# Patient Record
Sex: Female | Born: 1975 | Race: Black or African American | Hispanic: No | Marital: Single | Smoking: Never smoker
Health system: Southern US, Community
[De-identification: ages and names within clinical notes are randomized; demographics above are authoritative.]

## PROBLEM LIST (undated history)

## (undated) ENCOUNTER — Inpatient Hospital Stay (HOSPITAL_COMMUNITY): Payer: Self-pay

## (undated) DIAGNOSIS — F329 Major depressive disorder, single episode, unspecified: Secondary | ICD-10-CM

## (undated) DIAGNOSIS — B009 Herpesviral infection, unspecified: Secondary | ICD-10-CM

## (undated) DIAGNOSIS — F32A Depression, unspecified: Secondary | ICD-10-CM

## (undated) HISTORY — PX: NO PAST SURGERIES: SHX2092

## (undated) HISTORY — PX: WISDOM TOOTH EXTRACTION: SHX21

---

## 1998-03-24 ENCOUNTER — Emergency Department (HOSPITAL_COMMUNITY): Admission: EM | Admit: 1998-03-24 | Discharge: 1998-03-24 | Payer: Self-pay

## 1998-04-24 ENCOUNTER — Inpatient Hospital Stay (HOSPITAL_COMMUNITY): Admission: AD | Admit: 1998-04-24 | Discharge: 1998-04-24 | Payer: Self-pay | Admitting: *Deleted

## 1998-04-27 ENCOUNTER — Emergency Department (HOSPITAL_COMMUNITY): Admission: EM | Admit: 1998-04-27 | Discharge: 1998-04-27 | Payer: Self-pay | Admitting: Emergency Medicine

## 1998-05-15 ENCOUNTER — Inpatient Hospital Stay (HOSPITAL_COMMUNITY): Admission: AD | Admit: 1998-05-15 | Discharge: 1998-05-15 | Payer: Self-pay | Admitting: Obstetrics

## 1998-06-10 ENCOUNTER — Emergency Department (HOSPITAL_COMMUNITY): Admission: EM | Admit: 1998-06-10 | Discharge: 1998-06-10 | Payer: Self-pay | Admitting: Emergency Medicine

## 1998-06-15 ENCOUNTER — Encounter: Payer: Self-pay | Admitting: Emergency Medicine

## 1998-06-15 ENCOUNTER — Inpatient Hospital Stay (HOSPITAL_COMMUNITY): Admission: EM | Admit: 1998-06-15 | Discharge: 1998-06-17 | Payer: Self-pay | Admitting: Emergency Medicine

## 1998-06-16 ENCOUNTER — Encounter: Payer: Self-pay | Admitting: Internal Medicine

## 1998-07-22 ENCOUNTER — Inpatient Hospital Stay (HOSPITAL_COMMUNITY): Admission: AD | Admit: 1998-07-22 | Discharge: 1998-07-22 | Payer: Self-pay | Admitting: Obstetrics & Gynecology

## 1998-08-02 ENCOUNTER — Emergency Department (HOSPITAL_COMMUNITY): Admission: EM | Admit: 1998-08-02 | Discharge: 1998-08-02 | Payer: Self-pay | Admitting: Emergency Medicine

## 1998-08-09 ENCOUNTER — Inpatient Hospital Stay (HOSPITAL_COMMUNITY): Admission: AD | Admit: 1998-08-09 | Discharge: 1998-08-09 | Payer: Self-pay | Admitting: *Deleted

## 1998-08-15 ENCOUNTER — Encounter: Admission: RE | Admit: 1998-08-15 | Discharge: 1998-08-15 | Payer: Self-pay | Admitting: Internal Medicine

## 1998-08-21 ENCOUNTER — Emergency Department (HOSPITAL_COMMUNITY): Admission: EM | Admit: 1998-08-21 | Discharge: 1998-08-21 | Payer: Self-pay | Admitting: Emergency Medicine

## 1998-10-05 ENCOUNTER — Emergency Department (HOSPITAL_COMMUNITY): Admission: EM | Admit: 1998-10-05 | Discharge: 1998-10-05 | Payer: Self-pay | Admitting: Emergency Medicine

## 1998-10-24 ENCOUNTER — Inpatient Hospital Stay (HOSPITAL_COMMUNITY): Admission: AD | Admit: 1998-10-24 | Discharge: 1998-10-24 | Payer: Self-pay | Admitting: Obstetrics

## 1998-11-28 ENCOUNTER — Emergency Department (HOSPITAL_COMMUNITY): Admission: EM | Admit: 1998-11-28 | Discharge: 1998-11-28 | Payer: Self-pay | Admitting: Emergency Medicine

## 1998-12-18 ENCOUNTER — Emergency Department (HOSPITAL_COMMUNITY): Admission: EM | Admit: 1998-12-18 | Discharge: 1998-12-18 | Payer: Self-pay | Admitting: Emergency Medicine

## 1998-12-28 ENCOUNTER — Emergency Department (HOSPITAL_COMMUNITY): Admission: EM | Admit: 1998-12-28 | Discharge: 1998-12-28 | Payer: Self-pay | Admitting: *Deleted

## 1999-01-17 ENCOUNTER — Emergency Department (HOSPITAL_COMMUNITY): Admission: EM | Admit: 1999-01-17 | Discharge: 1999-01-17 | Payer: Self-pay | Admitting: Emergency Medicine

## 1999-01-17 ENCOUNTER — Encounter: Payer: Self-pay | Admitting: Emergency Medicine

## 1999-01-25 ENCOUNTER — Inpatient Hospital Stay (HOSPITAL_COMMUNITY): Admission: AD | Admit: 1999-01-25 | Discharge: 1999-01-25 | Payer: Self-pay | Admitting: Obstetrics

## 1999-02-05 ENCOUNTER — Emergency Department (HOSPITAL_COMMUNITY): Admission: EM | Admit: 1999-02-05 | Discharge: 1999-02-05 | Payer: Self-pay | Admitting: Emergency Medicine

## 1999-03-20 ENCOUNTER — Emergency Department (HOSPITAL_COMMUNITY): Admission: EM | Admit: 1999-03-20 | Discharge: 1999-03-20 | Payer: Self-pay | Admitting: Emergency Medicine

## 1999-03-23 ENCOUNTER — Emergency Department (HOSPITAL_COMMUNITY): Admission: EM | Admit: 1999-03-23 | Discharge: 1999-03-23 | Payer: Self-pay | Admitting: Emergency Medicine

## 1999-04-03 ENCOUNTER — Emergency Department (HOSPITAL_COMMUNITY): Admission: EM | Admit: 1999-04-03 | Discharge: 1999-04-03 | Payer: Self-pay | Admitting: Emergency Medicine

## 1999-04-18 ENCOUNTER — Emergency Department (HOSPITAL_COMMUNITY): Admission: EM | Admit: 1999-04-18 | Discharge: 1999-04-18 | Payer: Self-pay | Admitting: Emergency Medicine

## 1999-05-09 ENCOUNTER — Inpatient Hospital Stay (HOSPITAL_COMMUNITY): Admission: AD | Admit: 1999-05-09 | Discharge: 1999-05-09 | Payer: Self-pay | Admitting: Obstetrics & Gynecology

## 1999-05-17 ENCOUNTER — Encounter: Payer: Self-pay | Admitting: *Deleted

## 1999-05-17 ENCOUNTER — Inpatient Hospital Stay (HOSPITAL_COMMUNITY): Admission: RE | Admit: 1999-05-17 | Discharge: 1999-05-17 | Payer: Self-pay | Admitting: *Deleted

## 1999-05-17 ENCOUNTER — Emergency Department (HOSPITAL_COMMUNITY): Admission: EM | Admit: 1999-05-17 | Discharge: 1999-05-17 | Payer: Self-pay | Admitting: Emergency Medicine

## 1999-06-27 ENCOUNTER — Inpatient Hospital Stay (HOSPITAL_COMMUNITY): Admission: AD | Admit: 1999-06-27 | Discharge: 1999-06-27 | Payer: Self-pay | Admitting: *Deleted

## 1999-07-03 ENCOUNTER — Inpatient Hospital Stay (HOSPITAL_COMMUNITY): Admission: AD | Admit: 1999-07-03 | Discharge: 1999-07-03 | Payer: Self-pay | Admitting: Obstetrics

## 1999-07-04 ENCOUNTER — Emergency Department (HOSPITAL_COMMUNITY): Admission: EM | Admit: 1999-07-04 | Discharge: 1999-07-04 | Payer: Self-pay | Admitting: *Deleted

## 1999-07-20 ENCOUNTER — Encounter: Payer: Self-pay | Admitting: Obstetrics

## 1999-07-20 ENCOUNTER — Observation Stay (HOSPITAL_COMMUNITY): Admission: AD | Admit: 1999-07-20 | Discharge: 1999-07-20 | Payer: Self-pay | Admitting: Obstetrics

## 1999-08-06 ENCOUNTER — Observation Stay (HOSPITAL_COMMUNITY): Admission: AD | Admit: 1999-08-06 | Discharge: 1999-08-06 | Payer: Self-pay | Admitting: Obstetrics & Gynecology

## 1999-08-06 ENCOUNTER — Encounter: Payer: Self-pay | Admitting: Obstetrics

## 1999-08-10 ENCOUNTER — Encounter: Admission: RE | Admit: 1999-08-10 | Discharge: 1999-08-10 | Payer: Self-pay | Admitting: Obstetrics

## 1999-08-23 ENCOUNTER — Encounter: Admission: RE | Admit: 1999-08-23 | Discharge: 1999-08-23 | Payer: Self-pay | Admitting: Obstetrics & Gynecology

## 1999-08-24 ENCOUNTER — Inpatient Hospital Stay (HOSPITAL_COMMUNITY): Admission: AD | Admit: 1999-08-24 | Discharge: 1999-08-24 | Payer: Self-pay | Admitting: Obstetrics & Gynecology

## 1999-09-06 ENCOUNTER — Encounter: Admission: RE | Admit: 1999-09-06 | Discharge: 1999-09-06 | Payer: Self-pay | Admitting: Obstetrics & Gynecology

## 1999-09-08 ENCOUNTER — Observation Stay (HOSPITAL_COMMUNITY): Admission: AD | Admit: 1999-09-08 | Discharge: 1999-09-09 | Payer: Self-pay | Admitting: Obstetrics & Gynecology

## 1999-09-12 ENCOUNTER — Ambulatory Visit (HOSPITAL_COMMUNITY): Admission: RE | Admit: 1999-09-12 | Discharge: 1999-09-12 | Payer: Self-pay | Admitting: *Deleted

## 1999-09-20 ENCOUNTER — Encounter: Admission: RE | Admit: 1999-09-20 | Discharge: 1999-09-20 | Payer: Self-pay | Admitting: Obstetrics & Gynecology

## 1999-10-04 ENCOUNTER — Encounter: Admission: RE | Admit: 1999-10-04 | Discharge: 1999-10-04 | Payer: Self-pay | Admitting: Obstetrics & Gynecology

## 1999-10-16 ENCOUNTER — Inpatient Hospital Stay (HOSPITAL_COMMUNITY): Admission: AD | Admit: 1999-10-16 | Discharge: 1999-10-16 | Payer: Self-pay | Admitting: Obstetrics

## 1999-10-17 ENCOUNTER — Inpatient Hospital Stay (HOSPITAL_COMMUNITY): Admission: AD | Admit: 1999-10-17 | Discharge: 1999-10-17 | Payer: Self-pay | Admitting: *Deleted

## 1999-10-27 ENCOUNTER — Inpatient Hospital Stay (HOSPITAL_COMMUNITY): Admission: AD | Admit: 1999-10-27 | Discharge: 1999-10-27 | Payer: Self-pay | Admitting: Obstetrics & Gynecology

## 1999-11-01 ENCOUNTER — Encounter: Admission: RE | Admit: 1999-11-01 | Discharge: 1999-11-01 | Payer: Self-pay | Admitting: Obstetrics & Gynecology

## 1999-11-13 ENCOUNTER — Ambulatory Visit (HOSPITAL_COMMUNITY): Admission: RE | Admit: 1999-11-13 | Discharge: 1999-11-13 | Payer: Self-pay | Admitting: *Deleted

## 1999-11-15 ENCOUNTER — Encounter (HOSPITAL_COMMUNITY): Admission: RE | Admit: 1999-11-15 | Discharge: 1999-12-21 | Payer: Self-pay | Admitting: Obstetrics & Gynecology

## 1999-11-15 ENCOUNTER — Encounter: Admission: RE | Admit: 1999-11-15 | Discharge: 1999-11-15 | Payer: Self-pay | Admitting: Obstetrics & Gynecology

## 1999-11-15 ENCOUNTER — Inpatient Hospital Stay (HOSPITAL_COMMUNITY): Admission: AD | Admit: 1999-11-15 | Discharge: 1999-11-15 | Payer: Self-pay | Admitting: Obstetrics

## 1999-11-15 ENCOUNTER — Encounter: Payer: Self-pay | Admitting: Obstetrics

## 1999-11-16 ENCOUNTER — Inpatient Hospital Stay (HOSPITAL_COMMUNITY): Admission: AD | Admit: 1999-11-16 | Discharge: 1999-11-16 | Payer: Self-pay | Admitting: Obstetrics & Gynecology

## 1999-11-17 ENCOUNTER — Inpatient Hospital Stay (HOSPITAL_COMMUNITY): Admission: AD | Admit: 1999-11-17 | Discharge: 1999-11-17 | Payer: Self-pay | Admitting: *Deleted

## 1999-11-22 ENCOUNTER — Encounter: Admission: RE | Admit: 1999-11-22 | Discharge: 1999-11-22 | Payer: Self-pay | Admitting: Obstetrics & Gynecology

## 1999-11-23 ENCOUNTER — Inpatient Hospital Stay (HOSPITAL_COMMUNITY): Admission: AD | Admit: 1999-11-23 | Discharge: 1999-11-23 | Payer: Self-pay | Admitting: *Deleted

## 1999-12-06 ENCOUNTER — Encounter: Admission: RE | Admit: 1999-12-06 | Discharge: 1999-12-06 | Payer: Self-pay | Admitting: Obstetrics

## 1999-12-11 ENCOUNTER — Inpatient Hospital Stay (HOSPITAL_COMMUNITY): Admission: AD | Admit: 1999-12-11 | Discharge: 1999-12-11 | Payer: Self-pay | Admitting: Obstetrics

## 1999-12-13 ENCOUNTER — Encounter: Admission: RE | Admit: 1999-12-13 | Discharge: 1999-12-13 | Payer: Self-pay | Admitting: Obstetrics

## 1999-12-18 ENCOUNTER — Encounter: Payer: Self-pay | Admitting: Obstetrics

## 1999-12-18 ENCOUNTER — Inpatient Hospital Stay (HOSPITAL_COMMUNITY): Admission: AD | Admit: 1999-12-18 | Discharge: 1999-12-22 | Payer: Self-pay | Admitting: Obstetrics

## 1999-12-23 ENCOUNTER — Encounter: Admission: RE | Admit: 1999-12-23 | Discharge: 2000-03-22 | Payer: Self-pay | Admitting: Obstetrics

## 2000-01-03 ENCOUNTER — Emergency Department (HOSPITAL_COMMUNITY): Admission: EM | Admit: 2000-01-03 | Discharge: 2000-01-03 | Payer: Self-pay | Admitting: Emergency Medicine

## 2000-01-05 ENCOUNTER — Inpatient Hospital Stay (HOSPITAL_COMMUNITY): Admission: AD | Admit: 2000-01-05 | Discharge: 2000-01-05 | Payer: Self-pay | Admitting: *Deleted

## 2000-03-05 ENCOUNTER — Encounter: Payer: Self-pay | Admitting: Emergency Medicine

## 2000-03-05 ENCOUNTER — Emergency Department (HOSPITAL_COMMUNITY): Admission: EM | Admit: 2000-03-05 | Discharge: 2000-03-05 | Payer: Self-pay | Admitting: Emergency Medicine

## 2000-03-10 ENCOUNTER — Emergency Department (HOSPITAL_COMMUNITY): Admission: EM | Admit: 2000-03-10 | Discharge: 2000-03-10 | Payer: Self-pay | Admitting: Internal Medicine

## 2000-03-15 ENCOUNTER — Emergency Department (HOSPITAL_COMMUNITY): Admission: EM | Admit: 2000-03-15 | Discharge: 2000-03-15 | Payer: Self-pay | Admitting: Emergency Medicine

## 2000-04-06 ENCOUNTER — Inpatient Hospital Stay (HOSPITAL_COMMUNITY): Admission: EM | Admit: 2000-04-06 | Discharge: 2000-04-09 | Payer: Self-pay | Admitting: Pulmonary Disease

## 2000-04-06 ENCOUNTER — Encounter: Payer: Self-pay | Admitting: Emergency Medicine

## 2000-05-06 LAB — PULMONARY FUNCTION TEST

## 2000-05-11 ENCOUNTER — Emergency Department (HOSPITAL_COMMUNITY): Admission: EM | Admit: 2000-05-11 | Discharge: 2000-05-11 | Payer: Self-pay | Admitting: Emergency Medicine

## 2000-05-11 ENCOUNTER — Encounter: Payer: Self-pay | Admitting: Emergency Medicine

## 2000-11-03 ENCOUNTER — Inpatient Hospital Stay (HOSPITAL_COMMUNITY): Admission: AD | Admit: 2000-11-03 | Discharge: 2000-11-03 | Payer: Self-pay | Admitting: Obstetrics

## 2000-11-03 ENCOUNTER — Encounter: Payer: Self-pay | Admitting: *Deleted

## 2000-11-06 ENCOUNTER — Inpatient Hospital Stay (HOSPITAL_COMMUNITY): Admission: AD | Admit: 2000-11-06 | Discharge: 2000-11-06 | Payer: Self-pay | Admitting: *Deleted

## 2000-11-26 ENCOUNTER — Inpatient Hospital Stay (HOSPITAL_COMMUNITY): Admission: AD | Admit: 2000-11-26 | Discharge: 2000-11-26 | Payer: Self-pay | Admitting: *Deleted

## 2000-11-29 ENCOUNTER — Inpatient Hospital Stay (HOSPITAL_COMMUNITY): Admission: AD | Admit: 2000-11-29 | Discharge: 2000-11-29 | Payer: Self-pay | Admitting: *Deleted

## 2000-12-25 ENCOUNTER — Inpatient Hospital Stay (HOSPITAL_COMMUNITY): Admission: AD | Admit: 2000-12-25 | Discharge: 2000-12-25 | Payer: Self-pay | Admitting: *Deleted

## 2001-01-27 ENCOUNTER — Ambulatory Visit (HOSPITAL_COMMUNITY): Admission: RE | Admit: 2001-01-27 | Discharge: 2001-01-27 | Payer: Self-pay | Admitting: Obstetrics

## 2001-02-05 ENCOUNTER — Inpatient Hospital Stay (HOSPITAL_COMMUNITY): Admission: AD | Admit: 2001-02-05 | Discharge: 2001-02-05 | Payer: Self-pay | Admitting: Obstetrics

## 2001-02-06 ENCOUNTER — Encounter: Admission: RE | Admit: 2001-02-06 | Discharge: 2001-02-06 | Payer: Self-pay | Admitting: Obstetrics

## 2001-02-20 ENCOUNTER — Encounter: Admission: RE | Admit: 2001-02-20 | Discharge: 2001-02-20 | Payer: Self-pay | Admitting: Obstetrics

## 2001-03-01 ENCOUNTER — Inpatient Hospital Stay (HOSPITAL_COMMUNITY): Admission: AD | Admit: 2001-03-01 | Discharge: 2001-03-01 | Payer: Self-pay | Admitting: Obstetrics

## 2001-03-06 ENCOUNTER — Encounter: Admission: RE | Admit: 2001-03-06 | Discharge: 2001-03-06 | Payer: Self-pay | Admitting: Obstetrics

## 2001-03-15 ENCOUNTER — Inpatient Hospital Stay (HOSPITAL_COMMUNITY): Admission: AD | Admit: 2001-03-15 | Discharge: 2001-03-15 | Payer: Self-pay | Admitting: *Deleted

## 2001-03-19 ENCOUNTER — Inpatient Hospital Stay (HOSPITAL_COMMUNITY): Admission: AD | Admit: 2001-03-19 | Discharge: 2001-03-19 | Payer: Self-pay | Admitting: Obstetrics

## 2001-04-02 ENCOUNTER — Encounter: Admission: RE | Admit: 2001-04-02 | Discharge: 2001-04-02 | Payer: Self-pay | Admitting: Obstetrics & Gynecology

## 2001-04-17 ENCOUNTER — Encounter: Admission: RE | Admit: 2001-04-17 | Discharge: 2001-04-17 | Payer: Self-pay | Admitting: Obstetrics

## 2001-04-28 ENCOUNTER — Ambulatory Visit (HOSPITAL_COMMUNITY): Admission: RE | Admit: 2001-04-28 | Discharge: 2001-04-28 | Payer: Self-pay | Admitting: Obstetrics & Gynecology

## 2001-05-08 ENCOUNTER — Encounter: Admission: RE | Admit: 2001-05-08 | Discharge: 2001-05-08 | Payer: Self-pay | Admitting: Obstetrics

## 2001-06-05 ENCOUNTER — Inpatient Hospital Stay (HOSPITAL_COMMUNITY): Admission: AD | Admit: 2001-06-05 | Discharge: 2001-06-05 | Payer: Self-pay | Admitting: Obstetrics

## 2001-06-12 ENCOUNTER — Encounter: Admission: RE | Admit: 2001-06-12 | Discharge: 2001-06-12 | Payer: Self-pay | Admitting: Obstetrics

## 2001-06-19 ENCOUNTER — Encounter: Admission: RE | Admit: 2001-06-19 | Discharge: 2001-06-19 | Payer: Self-pay | Admitting: Obstetrics

## 2001-06-21 ENCOUNTER — Inpatient Hospital Stay (HOSPITAL_COMMUNITY): Admission: AD | Admit: 2001-06-21 | Discharge: 2001-06-21 | Payer: Self-pay | Admitting: *Deleted

## 2001-06-26 ENCOUNTER — Inpatient Hospital Stay (HOSPITAL_COMMUNITY): Admission: AD | Admit: 2001-06-26 | Discharge: 2001-06-26 | Payer: Self-pay | Admitting: Obstetrics & Gynecology

## 2001-06-26 ENCOUNTER — Encounter: Admission: RE | Admit: 2001-06-26 | Discharge: 2001-06-26 | Payer: Self-pay | Admitting: Obstetrics

## 2001-06-27 ENCOUNTER — Inpatient Hospital Stay (HOSPITAL_COMMUNITY): Admission: AD | Admit: 2001-06-27 | Discharge: 2001-06-27 | Payer: Self-pay | Admitting: Obstetrics

## 2001-06-27 ENCOUNTER — Encounter: Payer: Self-pay | Admitting: *Deleted

## 2001-06-28 ENCOUNTER — Encounter (INDEPENDENT_AMBULATORY_CARE_PROVIDER_SITE_OTHER): Payer: Self-pay

## 2001-06-28 ENCOUNTER — Inpatient Hospital Stay (HOSPITAL_COMMUNITY): Admission: AD | Admit: 2001-06-28 | Discharge: 2001-06-30 | Payer: Self-pay | Admitting: Obstetrics

## 2001-07-01 ENCOUNTER — Encounter: Admission: RE | Admit: 2001-07-01 | Discharge: 2001-07-31 | Payer: Self-pay | Admitting: Obstetrics

## 2001-08-01 ENCOUNTER — Encounter: Admission: RE | Admit: 2001-08-01 | Discharge: 2001-08-31 | Payer: Self-pay | Admitting: Obstetrics

## 2001-08-29 ENCOUNTER — Emergency Department (HOSPITAL_COMMUNITY): Admission: EM | Admit: 2001-08-29 | Discharge: 2001-08-29 | Payer: Self-pay | Admitting: *Deleted

## 2001-09-29 ENCOUNTER — Encounter: Admission: RE | Admit: 2001-09-29 | Discharge: 2001-10-29 | Payer: Self-pay | Admitting: Obstetrics

## 2001-11-29 ENCOUNTER — Encounter: Admission: RE | Admit: 2001-11-29 | Discharge: 2001-12-29 | Payer: Self-pay | Admitting: Obstetrics

## 2002-01-29 ENCOUNTER — Encounter: Admission: RE | Admit: 2002-01-29 | Discharge: 2002-02-28 | Payer: Self-pay | Admitting: Obstetrics

## 2002-03-01 ENCOUNTER — Encounter: Admission: RE | Admit: 2002-03-01 | Discharge: 2002-03-31 | Payer: Self-pay | Admitting: Obstetrics

## 2002-03-14 ENCOUNTER — Emergency Department (HOSPITAL_COMMUNITY): Admission: EM | Admit: 2002-03-14 | Discharge: 2002-03-14 | Payer: Self-pay | Admitting: Emergency Medicine

## 2002-03-18 ENCOUNTER — Emergency Department (HOSPITAL_COMMUNITY): Admission: EM | Admit: 2002-03-18 | Discharge: 2002-03-18 | Payer: Self-pay | Admitting: *Deleted

## 2002-05-01 ENCOUNTER — Encounter: Admission: RE | Admit: 2002-05-01 | Discharge: 2002-05-31 | Payer: Self-pay | Admitting: Obstetrics

## 2002-07-01 ENCOUNTER — Encounter: Admission: RE | Admit: 2002-07-01 | Discharge: 2002-07-31 | Payer: Self-pay | Admitting: Obstetrics

## 2002-08-01 ENCOUNTER — Encounter: Admission: RE | Admit: 2002-08-01 | Discharge: 2002-08-31 | Payer: Self-pay | Admitting: Obstetrics

## 2002-09-30 ENCOUNTER — Encounter: Admission: RE | Admit: 2002-09-30 | Discharge: 2002-10-30 | Payer: Self-pay | Admitting: Obstetrics

## 2003-08-09 ENCOUNTER — Emergency Department (HOSPITAL_COMMUNITY): Admission: EM | Admit: 2003-08-09 | Discharge: 2003-08-09 | Payer: Self-pay | Admitting: Emergency Medicine

## 2003-11-15 ENCOUNTER — Emergency Department (HOSPITAL_COMMUNITY): Admission: EM | Admit: 2003-11-15 | Discharge: 2003-11-15 | Payer: Self-pay | Admitting: Emergency Medicine

## 2004-01-04 ENCOUNTER — Emergency Department (HOSPITAL_COMMUNITY): Admission: EM | Admit: 2004-01-04 | Discharge: 2004-01-04 | Payer: Self-pay | Admitting: Emergency Medicine

## 2004-03-07 ENCOUNTER — Emergency Department (HOSPITAL_COMMUNITY): Admission: EM | Admit: 2004-03-07 | Discharge: 2004-03-07 | Payer: Self-pay | Admitting: Emergency Medicine

## 2004-03-13 ENCOUNTER — Emergency Department (HOSPITAL_COMMUNITY): Admission: EM | Admit: 2004-03-13 | Discharge: 2004-03-13 | Payer: Self-pay | Admitting: Emergency Medicine

## 2004-05-03 ENCOUNTER — Ambulatory Visit: Payer: Self-pay | Admitting: Pulmonary Disease

## 2004-05-06 ENCOUNTER — Emergency Department (HOSPITAL_COMMUNITY): Admission: EM | Admit: 2004-05-06 | Discharge: 2004-05-06 | Payer: Self-pay

## 2004-08-11 ENCOUNTER — Ambulatory Visit: Payer: Self-pay | Admitting: Pulmonary Disease

## 2004-10-23 ENCOUNTER — Emergency Department (HOSPITAL_COMMUNITY): Admission: EM | Admit: 2004-10-23 | Discharge: 2004-10-23 | Payer: Self-pay | Admitting: Emergency Medicine

## 2005-02-16 ENCOUNTER — Emergency Department (HOSPITAL_COMMUNITY): Admission: EM | Admit: 2005-02-16 | Discharge: 2005-02-16 | Payer: Self-pay | Admitting: Emergency Medicine

## 2005-03-24 ENCOUNTER — Emergency Department (HOSPITAL_COMMUNITY): Admission: EM | Admit: 2005-03-24 | Discharge: 2005-03-24 | Payer: Self-pay | Admitting: *Deleted

## 2005-04-18 ENCOUNTER — Ambulatory Visit: Payer: Self-pay | Admitting: Pulmonary Disease

## 2005-04-30 ENCOUNTER — Ambulatory Visit: Payer: Self-pay | Admitting: Pulmonary Disease

## 2005-06-26 ENCOUNTER — Emergency Department (HOSPITAL_COMMUNITY): Admission: EM | Admit: 2005-06-26 | Discharge: 2005-06-26 | Payer: Self-pay | Admitting: Emergency Medicine

## 2005-06-30 ENCOUNTER — Emergency Department (HOSPITAL_COMMUNITY): Admission: EM | Admit: 2005-06-30 | Discharge: 2005-06-30 | Payer: Self-pay | Admitting: Emergency Medicine

## 2005-08-05 ENCOUNTER — Emergency Department (HOSPITAL_COMMUNITY): Admission: EM | Admit: 2005-08-05 | Discharge: 2005-08-05 | Payer: Self-pay | Admitting: Family Medicine

## 2005-08-24 ENCOUNTER — Ambulatory Visit: Payer: Self-pay | Admitting: Pulmonary Disease

## 2005-08-31 ENCOUNTER — Ambulatory Visit: Payer: Self-pay | Admitting: Pulmonary Disease

## 2005-10-05 ENCOUNTER — Emergency Department (HOSPITAL_COMMUNITY): Admission: EM | Admit: 2005-10-05 | Discharge: 2005-10-05 | Payer: Self-pay | Admitting: Emergency Medicine

## 2005-11-06 ENCOUNTER — Ambulatory Visit: Payer: Self-pay | Admitting: Pulmonary Disease

## 2005-12-31 ENCOUNTER — Ambulatory Visit: Payer: Self-pay | Admitting: Pulmonary Disease

## 2006-03-27 ENCOUNTER — Emergency Department (HOSPITAL_COMMUNITY): Admission: EM | Admit: 2006-03-27 | Discharge: 2006-03-27 | Payer: Self-pay | Admitting: Family Medicine

## 2006-05-24 ENCOUNTER — Emergency Department (HOSPITAL_COMMUNITY): Admission: EM | Admit: 2006-05-24 | Discharge: 2006-05-24 | Payer: Self-pay | Admitting: Emergency Medicine

## 2006-05-25 ENCOUNTER — Emergency Department (HOSPITAL_COMMUNITY): Admission: EM | Admit: 2006-05-25 | Discharge: 2006-05-25 | Payer: Self-pay | Admitting: Emergency Medicine

## 2006-06-01 ENCOUNTER — Emergency Department (HOSPITAL_COMMUNITY): Admission: EM | Admit: 2006-06-01 | Discharge: 2006-06-01 | Payer: Self-pay | Admitting: Emergency Medicine

## 2006-07-24 ENCOUNTER — Emergency Department (HOSPITAL_COMMUNITY): Admission: EM | Admit: 2006-07-24 | Discharge: 2006-07-24 | Payer: Self-pay | Admitting: Family Medicine

## 2006-10-12 ENCOUNTER — Emergency Department (HOSPITAL_COMMUNITY): Admission: EM | Admit: 2006-10-12 | Discharge: 2006-10-12 | Payer: Self-pay | Admitting: Emergency Medicine

## 2006-10-23 ENCOUNTER — Emergency Department (HOSPITAL_COMMUNITY): Admission: EM | Admit: 2006-10-23 | Discharge: 2006-10-23 | Payer: Self-pay | Admitting: Family Medicine

## 2006-10-25 ENCOUNTER — Emergency Department (HOSPITAL_COMMUNITY): Admission: EM | Admit: 2006-10-25 | Discharge: 2006-10-25 | Payer: Self-pay | Admitting: Emergency Medicine

## 2006-11-10 ENCOUNTER — Emergency Department (HOSPITAL_COMMUNITY): Admission: EM | Admit: 2006-11-10 | Discharge: 2006-11-10 | Payer: Self-pay | Admitting: Family Medicine

## 2006-12-12 ENCOUNTER — Emergency Department (HOSPITAL_COMMUNITY): Admission: EM | Admit: 2006-12-12 | Discharge: 2006-12-12 | Payer: Self-pay | Admitting: Emergency Medicine

## 2007-01-09 ENCOUNTER — Emergency Department (HOSPITAL_COMMUNITY): Admission: EM | Admit: 2007-01-09 | Discharge: 2007-01-09 | Payer: Self-pay | Admitting: Emergency Medicine

## 2007-01-27 ENCOUNTER — Emergency Department (HOSPITAL_COMMUNITY): Admission: EM | Admit: 2007-01-27 | Discharge: 2007-01-27 | Payer: Self-pay | Admitting: Emergency Medicine

## 2007-02-25 ENCOUNTER — Emergency Department (HOSPITAL_COMMUNITY): Admission: EM | Admit: 2007-02-25 | Discharge: 2007-02-25 | Payer: Self-pay | Admitting: Emergency Medicine

## 2007-03-27 ENCOUNTER — Emergency Department (HOSPITAL_COMMUNITY): Admission: EM | Admit: 2007-03-27 | Discharge: 2007-03-27 | Payer: Self-pay | Admitting: Emergency Medicine

## 2007-05-08 ENCOUNTER — Inpatient Hospital Stay (HOSPITAL_COMMUNITY): Admission: AD | Admit: 2007-05-08 | Discharge: 2007-05-08 | Payer: Self-pay | Admitting: Gynecology

## 2007-06-11 ENCOUNTER — Emergency Department (HOSPITAL_COMMUNITY): Admission: EM | Admit: 2007-06-11 | Discharge: 2007-06-11 | Payer: Self-pay | Admitting: Family Medicine

## 2008-01-27 IMAGING — CR DG CHEST 2V
2 series · 2 of 2 positions shown · non-contrast
Comparison: 01/27/2007

CLINICAL DATA: Shortness of breath, asthma

CHEST - 2 VIEW:

[w chest pa]
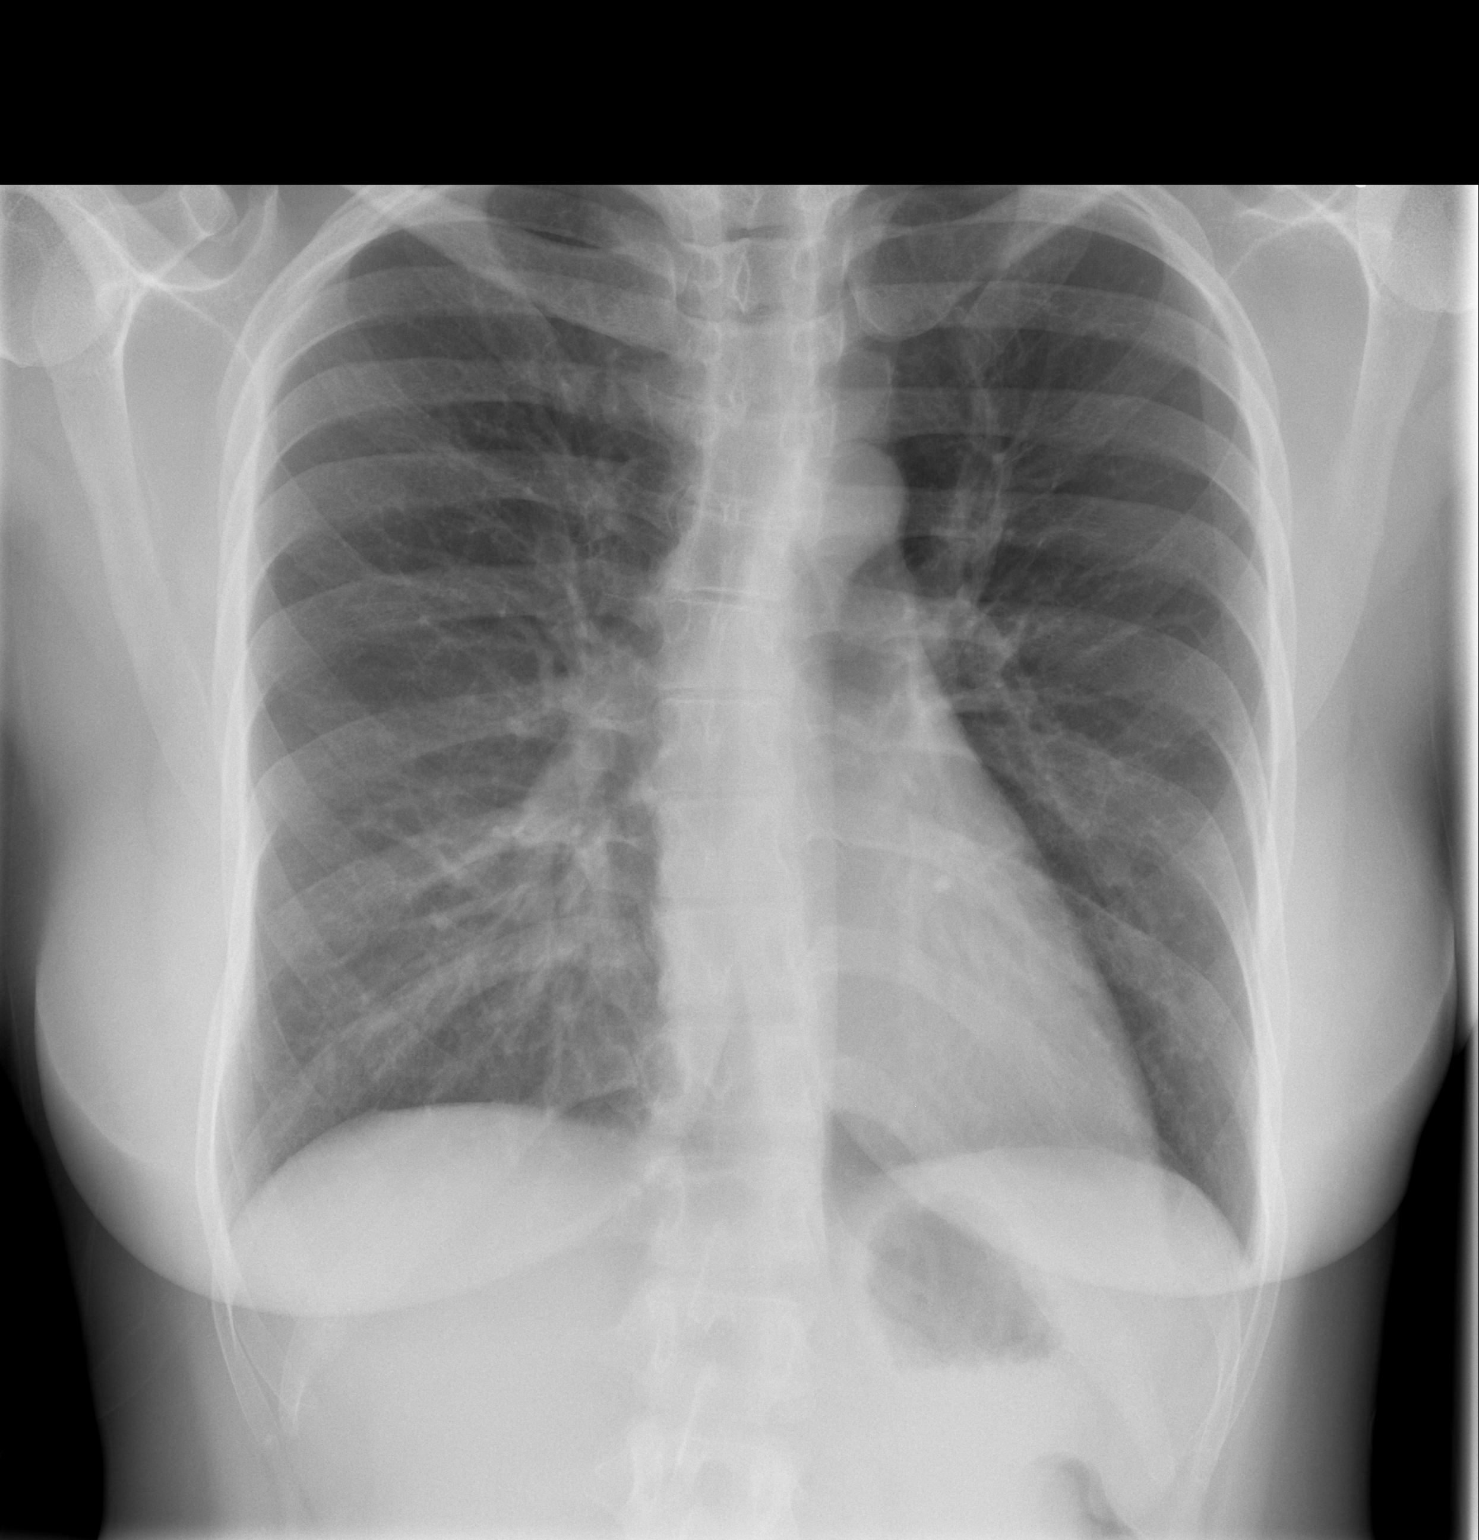

[w chest lat]
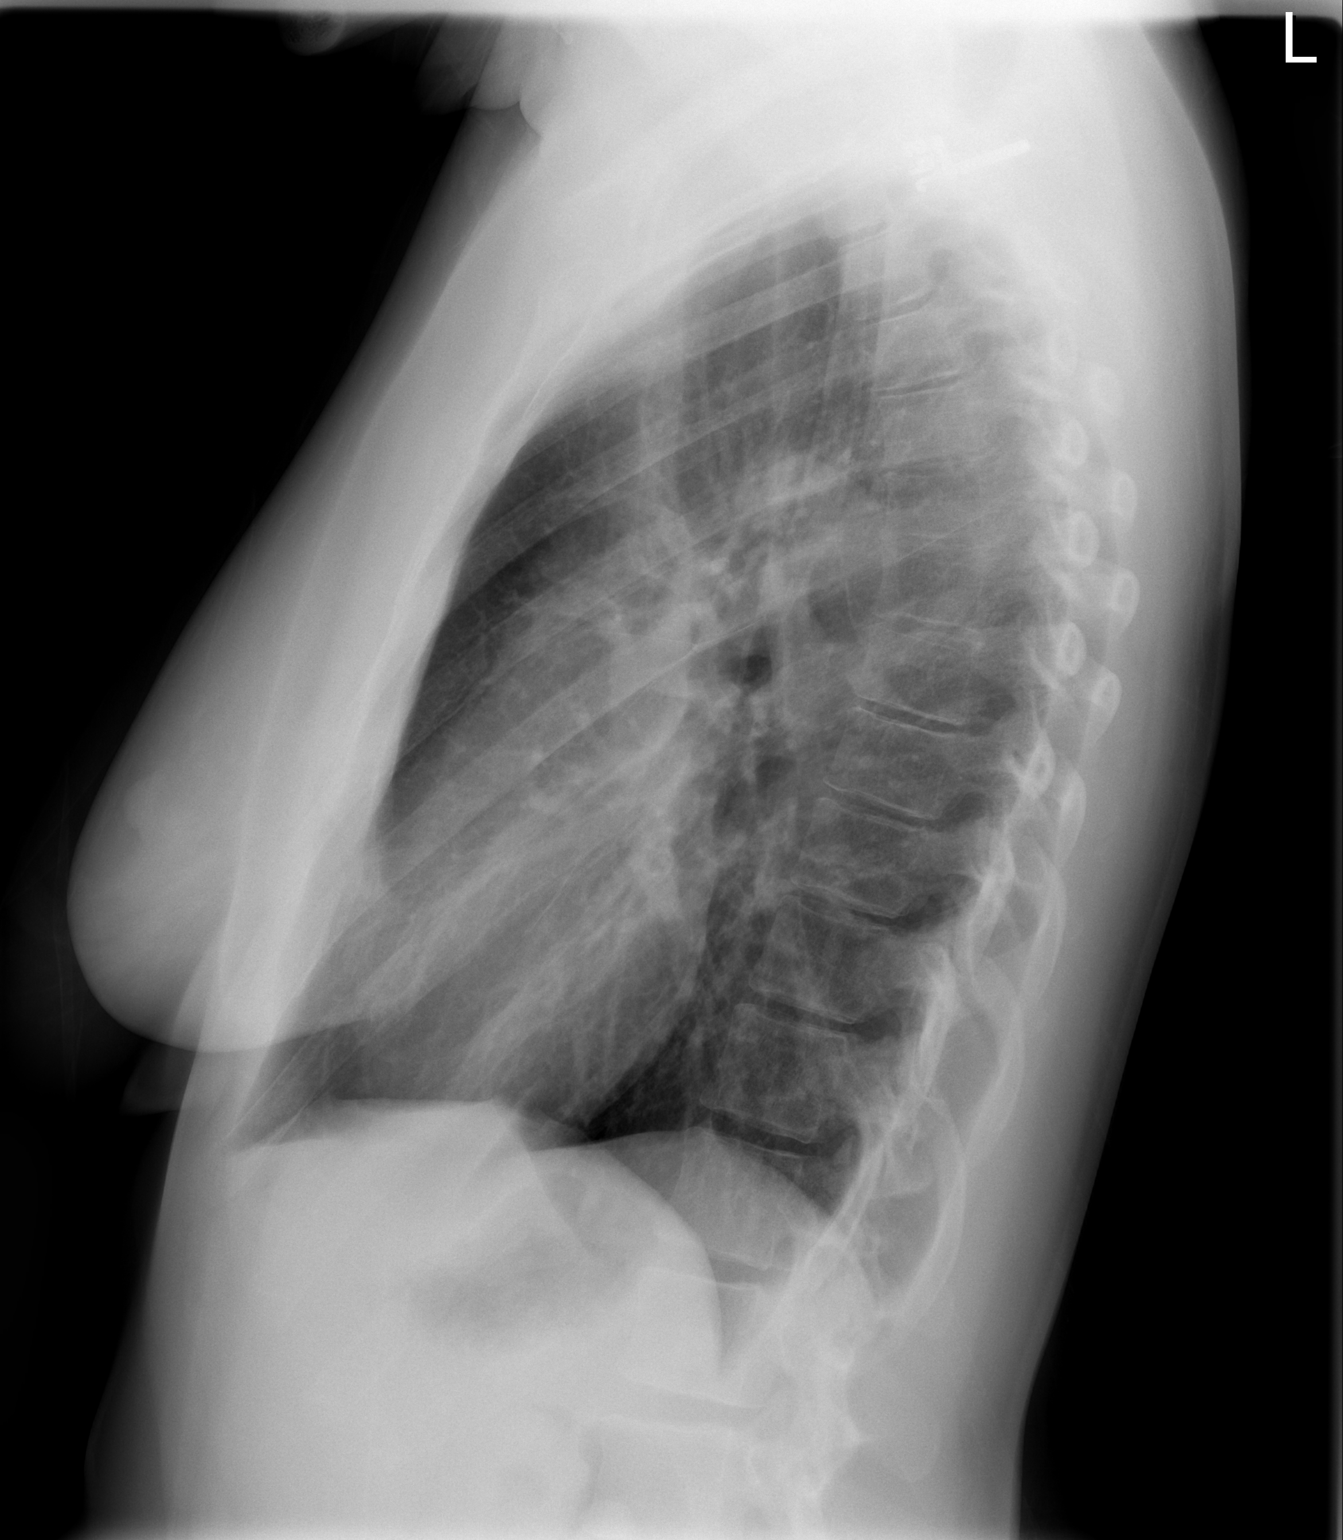

[2 of 2 positions shown; findings below may reference images not displayed]

FINDINGS: There is stable hyperinflation and peribronchial thickening. Heart is
normal size. No effusions or focal opacities. Visualized skeleton unremarkable.
IMPRESSION: Stable hyperinflation and peribronchial thickening.

## 2008-02-26 IMAGING — CR DG CHEST 2V
2 series · 2 of 2 positions shown · non-contrast
Comparison: 02/25/07.

CLINICAL DATA: Cough/fever.  
 CHEST - 2 VIEW:

[w chest pa]
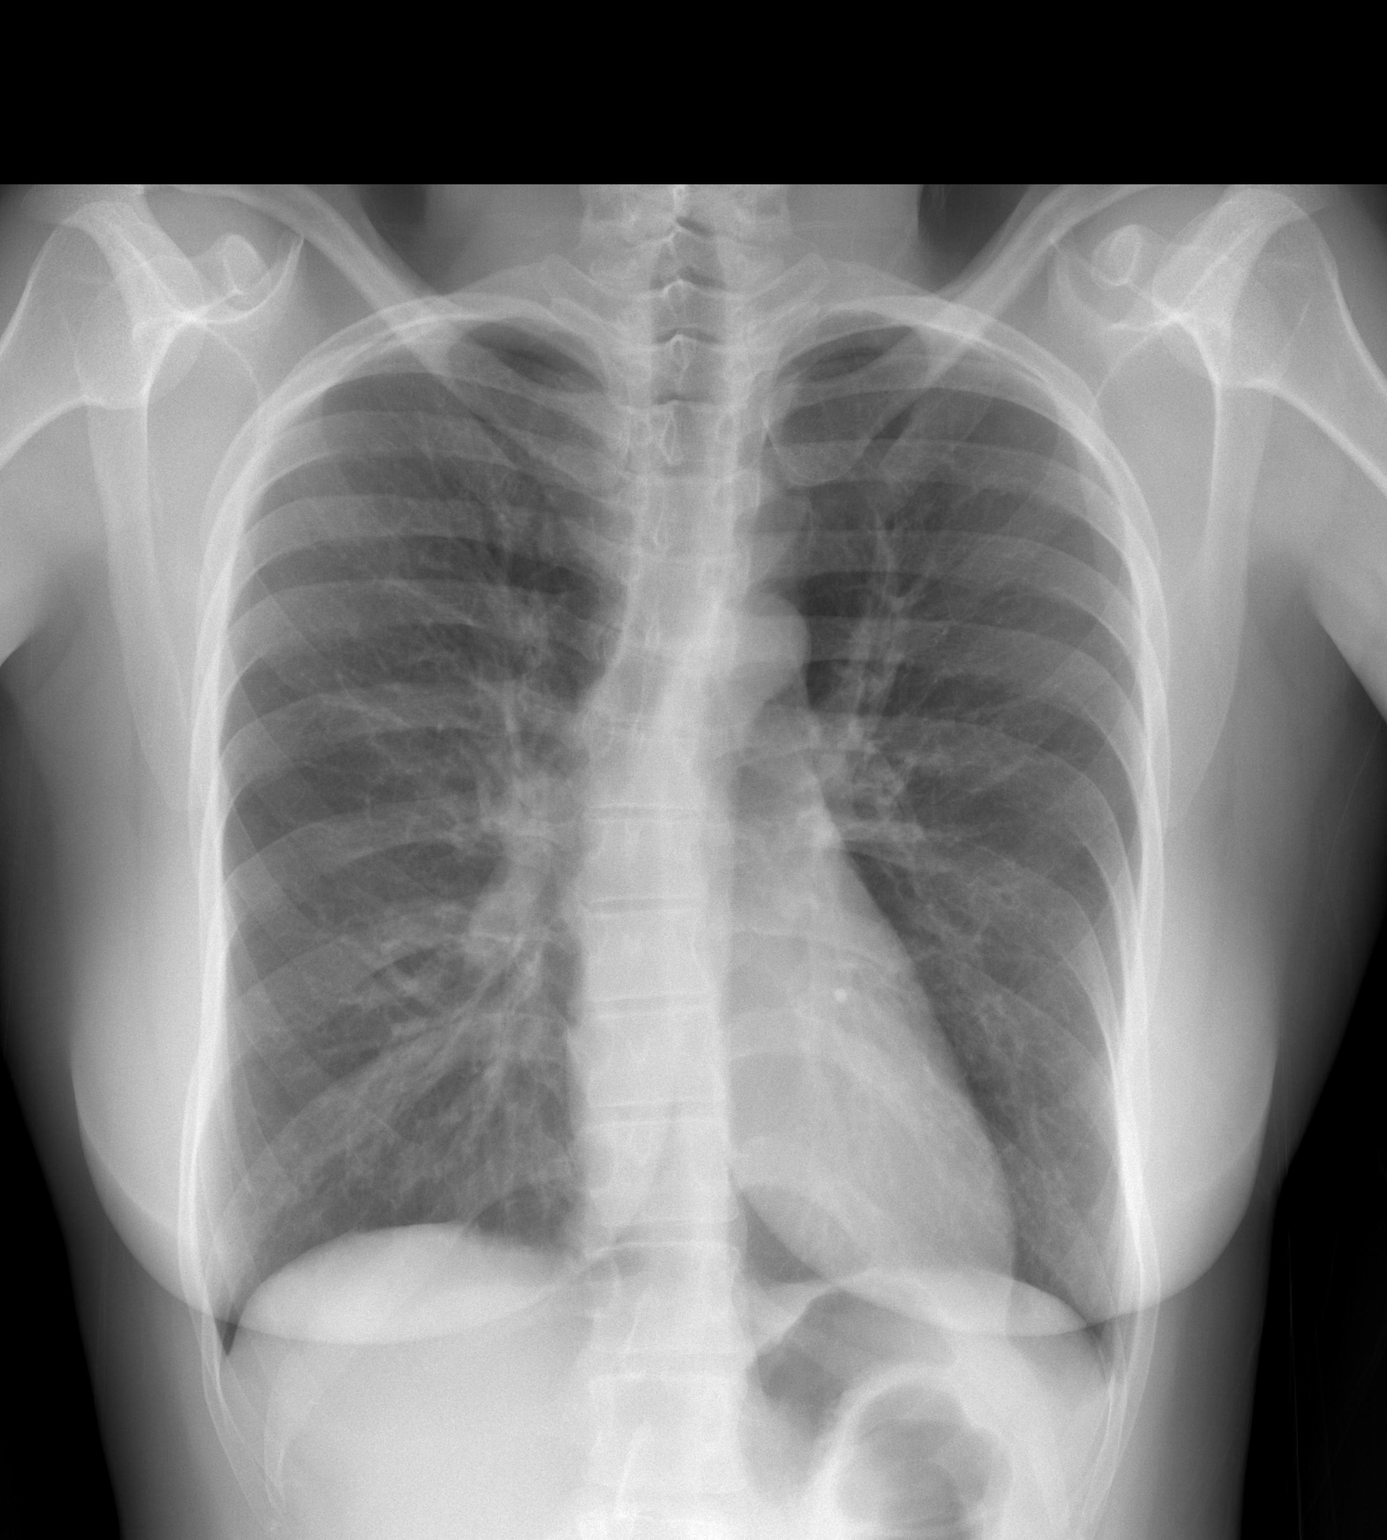

[w chest lat]
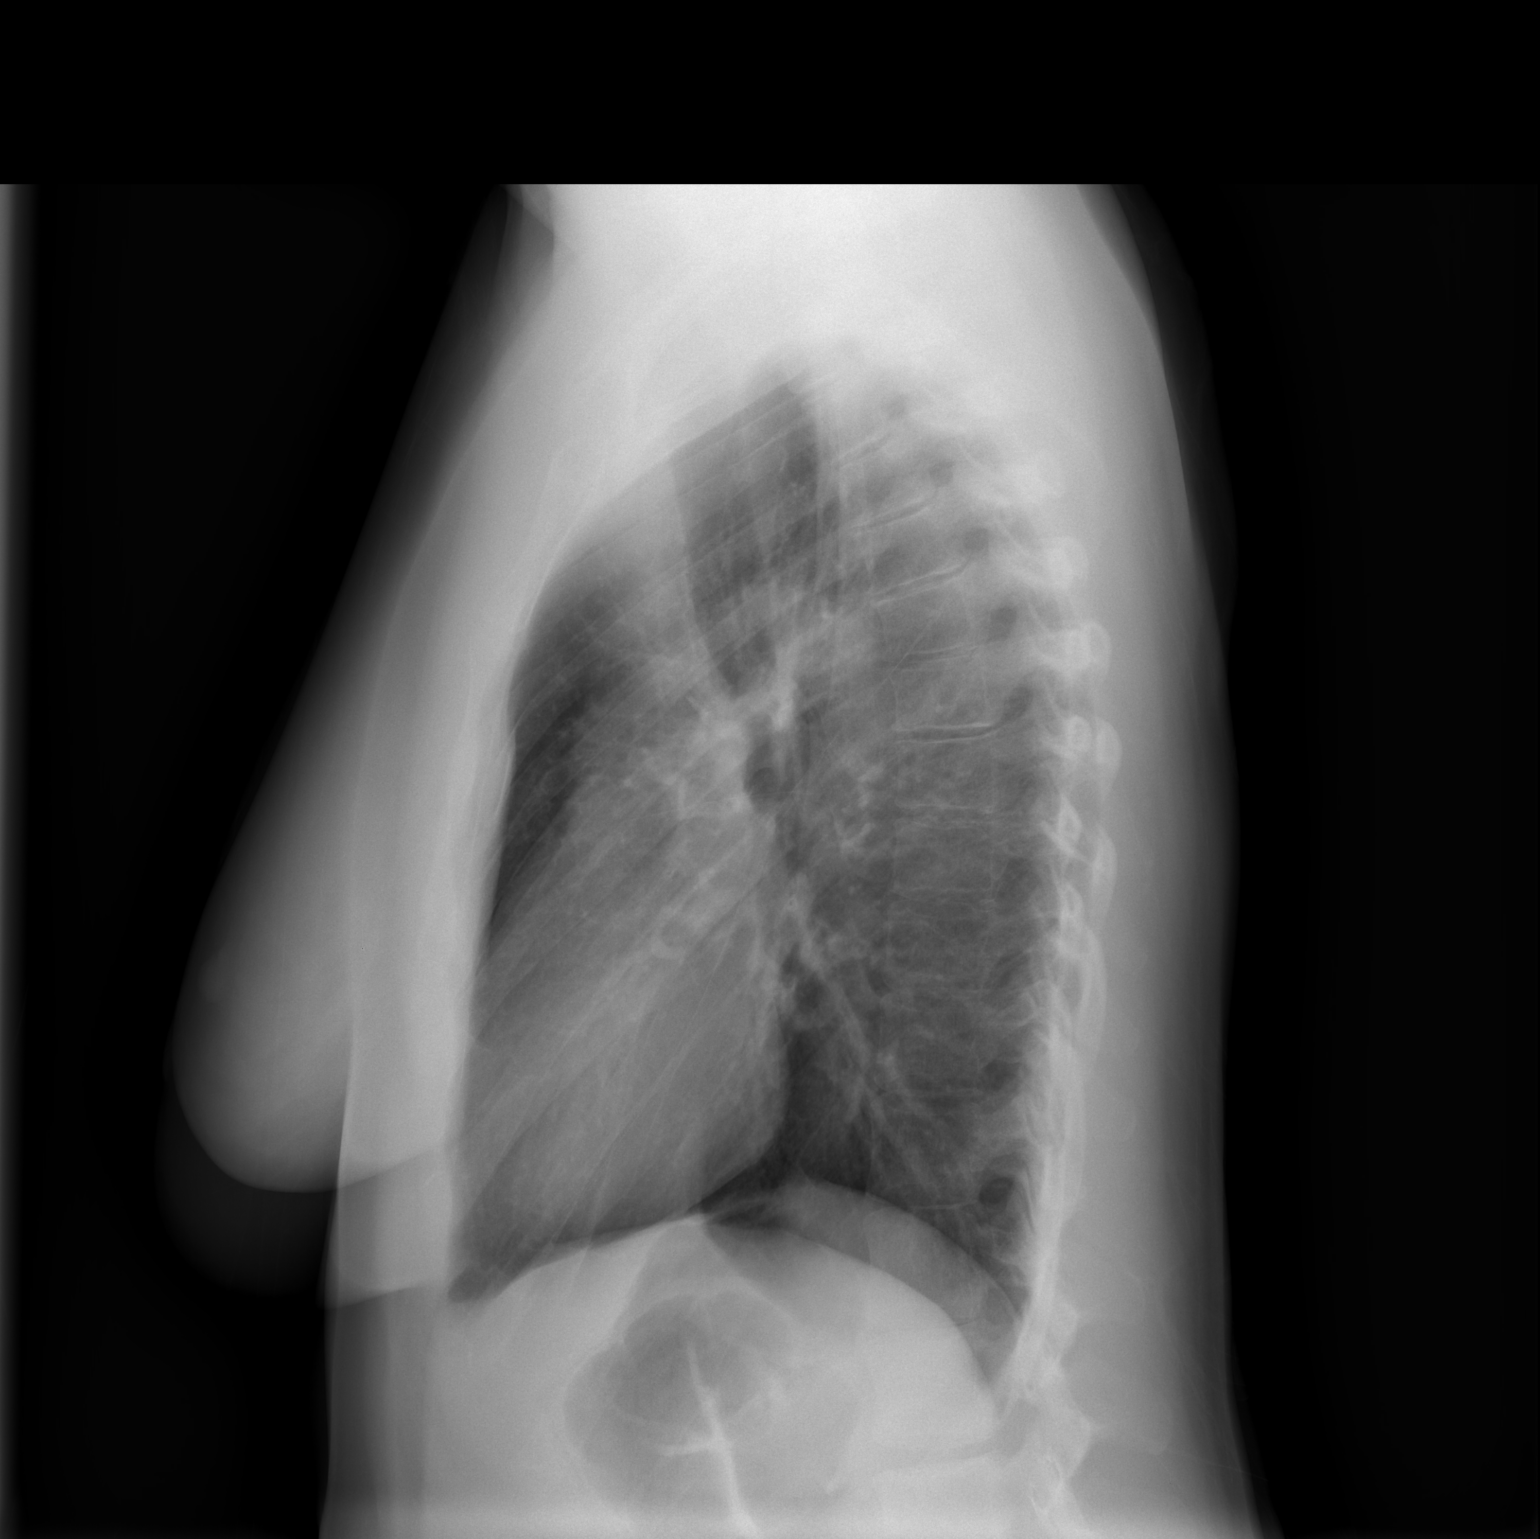

[2 of 2 positions shown; findings below may reference images not displayed]

FINDINGS: Heart and vascularity normal.  Lungs mildly hyperaerated with slight peribronchial thickening as before.  There is no acute process or interval change.
IMPRESSION: Chronic changes - no active disease.

## 2009-03-21 ENCOUNTER — Emergency Department (HOSPITAL_COMMUNITY): Admission: EM | Admit: 2009-03-21 | Discharge: 2009-03-21 | Payer: Self-pay | Admitting: Emergency Medicine

## 2010-01-10 ENCOUNTER — Emergency Department (HOSPITAL_COMMUNITY): Admission: EM | Admit: 2010-01-10 | Discharge: 2010-01-10 | Payer: Self-pay | Admitting: Emergency Medicine

## 2010-11-02 ENCOUNTER — Inpatient Hospital Stay (INDEPENDENT_AMBULATORY_CARE_PROVIDER_SITE_OTHER)
Admission: RE | Admit: 2010-11-02 | Discharge: 2010-11-02 | Disposition: A | Discharge status: Home / Self Care | Payer: Self-pay | Source: Ambulatory Visit | Attending: Family Medicine | Admitting: Family Medicine

## 2010-11-02 DIAGNOSIS — J45909 Unspecified asthma, uncomplicated: Secondary | ICD-10-CM

## 2010-11-05 ENCOUNTER — Emergency Department (INDEPENDENT_AMBULATORY_CARE_PROVIDER_SITE_OTHER): Payer: Self-pay

## 2010-11-05 ENCOUNTER — Emergency Department (HOSPITAL_BASED_OUTPATIENT_CLINIC_OR_DEPARTMENT_OTHER)
Admission: EM | Admit: 2010-11-05 | Discharge: 2010-11-05 | Disposition: A | Discharge status: Home / Self Care | Payer: Self-pay | Attending: Emergency Medicine | Admitting: Emergency Medicine

## 2010-11-05 DIAGNOSIS — R0602 Shortness of breath: Secondary | ICD-10-CM

## 2010-11-05 DIAGNOSIS — J45909 Unspecified asthma, uncomplicated: Secondary | ICD-10-CM | POA: Insufficient documentation

## 2010-11-10 NOTE — Discharge Summary (Signed)
[facility]  Patient:    Crystal Sampson, Crystal Sampson                   MRN: [id number] Adm. Date:  [id number] Disc. Date: [id number] Attending:  Gailen Shelter Dictator:   Earley Favor, RN, MSN, ACNP                           Discharge Summary  DATE OF BIRTH:  1976-05-04  DISCHARGE DIAGNOSES: 1. Status asthmaticus. 2. Respiratory distress. 3. Gastroesophageal reflux disease.  HISTORY OF PRESENT ILLNESS:  Crystal Sampson is a 35 year old African-American female who is a noncompliant asthmatic who is well known to Dr. Jayme Cloud at pulmonary of [facility]  She has been followed in the office for approximately three months.  She was not taking prescribed medications, i.e., Advair and Singulair.  She presented to the emergency room with acute respiratory distress and was admitted for further evaluation and treatment at that time time.  LABORATORY AND X-RAY DATA:  Sodium 140, potassium 3.9, chloride was 113, CO2 was 20, glucose was 168, BUN was 5, creatinine 0.8, calcium was 9.7, and magnesium was 2.1.  Chest x-ray showed no active lung disease with slight hyperaeration.  HOSPITAL COURSE:  STATUS ASTHMATICUS ALONG WITH RESPIRATORY DISTRESS AND GASTROESOPHAGEAL REFLUX DISEASE:  Crystal Sampson was admitted to [facility] and initially treated with IV Solu-Medrol, a beta agonist via nebulizers along with IV antibiotics.  Her gastroesophageal reflux was treated with H2-blocker.  She responded to treatment, although she was still wheezing on day #2 of admission.  She continued to improve and was discharged home on April 08, 2000, with pulmonary status improved.  She was without wheeze at that time. S he underwent extensive teaching concerning her compliance with medications per Dr. Jayme Cloud.  She was scheduled to follow up with Dr. Jayme Cloud in two weeks.  DISCHARGE MEDICATIONS: 1. Advair 250/50 one inhalation b.i.d. 2. Singulair 10 mg one q.d. 3.  Prednisone 20 mg tablets on a taper 40 mg x 4 days, 20 mg x 4 days, and    then stop. 4. Ventolin inhaler MDI two puffs q.4h. p.r.n. 5. Mycelex troches dissolve in mouth q.i.d. x 5 days.  DISCHARGE FOLLOWUP:  She was instructed to call Dr. Jayme Cloud for a follow-up appointment.  DISCHARGE INSTRUCTIONS:  Diet:  As tolerated.  SPECIAL INSTRUCTIONS:  Call for any problems with emphasis on being compliant with her medications.  DISPOSITION/CONDITION ON DISCHARGE:  Acute pulmonary distress has been resolved.  She was discharged home in improved condition. DD:  04/17/00 TD:  04/17/00 Job: [id number]

## 2010-11-10 NOTE — Discharge Summary (Signed)
[facility]  Patient:    Crystal Sampson, Crystal Sampson                   MRN: [id number] Adm. Date:  [id number] Disc. Date: [id number] Attending:  Gailen Shelter Dictator:   Earley Favor, RN, MSN, ACNP                           Discharge Summary  NO TEXT DD:  04/17/00 TD:  04/17/00 Job: [id number]
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
## 2011-04-03 LAB — WET PREP, GENITAL
Clue Cells Wet Prep HPF POC: NONE SEEN
Trich, Wet Prep: NONE SEEN

## 2011-04-03 LAB — GC/CHLAMYDIA PROBE AMP, GENITAL
Chlamydia, DNA Probe: NEGATIVE
GC Probe Amp, Genital: NEGATIVE

## 2011-04-03 LAB — POCT PREGNANCY, URINE: Preg Test, Ur: NEGATIVE

## 2011-04-03 LAB — URINALYSIS, ROUTINE W REFLEX MICROSCOPIC
Bilirubin Urine: NEGATIVE
Glucose, UA: NEGATIVE
Ketones, ur: NEGATIVE
Nitrite: NEGATIVE
Urobilinogen, UA: 0.2

## 2011-04-05 LAB — DIFFERENTIAL
Basophils Absolute: 0
Basophils Relative: 0
Eosinophils Absolute: 1.2 — ABNORMAL HIGH
Eosinophils Relative: 15 — ABNORMAL HIGH
Lymphocytes Relative: 31
Lymphs Abs: 2.6
Monocytes Absolute: 0.4
Monocytes Relative: 5
Neutro Abs: 4
Neutrophils Relative %: 49

## 2011-04-05 LAB — CBC
HCT: 40.3
Hemoglobin: 13.9
MCHC: 34.6
MCV: 92.6
Platelets: 338
RBC: 4.35
RDW: 12
WBC: 8.2

## 2011-05-08 ENCOUNTER — Encounter: Payer: Self-pay | Admitting: Cardiology

## 2011-05-08 ENCOUNTER — Emergency Department (HOSPITAL_COMMUNITY): Admission: EM | Admit: 2011-05-08 | Discharge: 2011-05-08 | Discharge status: Against Medical Advice | Payer: Self-pay

## 2011-05-08 ENCOUNTER — Emergency Department (INDEPENDENT_AMBULATORY_CARE_PROVIDER_SITE_OTHER)
Admission: EM | Admit: 2011-05-08 | Discharge: 2011-05-08 | Disposition: A | Discharge status: Home / Self Care | Payer: Self-pay | Source: Home / Self Care | Attending: Emergency Medicine | Admitting: Emergency Medicine

## 2011-05-08 DIAGNOSIS — J45909 Unspecified asthma, uncomplicated: Secondary | ICD-10-CM

## 2011-05-08 MED ORDER — ALBUTEROL SULFATE (5 MG/ML) 0.5% IN NEBU
5.0000 mg | INHALATION_SOLUTION | Freq: Once | RESPIRATORY_TRACT | Status: AC
  Administered 2011-05-08: 5 mg via RESPIRATORY_TRACT

## 2011-05-08 MED ORDER — ALBUTEROL SULFATE HFA 108 (90 BASE) MCG/ACT IN AERS: 2.0000 | INHALATION_SPRAY | Freq: Four times a day (QID) | RESPIRATORY_TRACT | Status: DC | PRN

## 2011-05-08 MED ORDER — BECLOMETHASONE DIPROPIONATE 80 MCG/ACT IN AERS: 2.0000 | INHALATION_SPRAY | Freq: Two times a day (BID) | RESPIRATORY_TRACT | Status: DC

## 2011-05-08 MED ORDER — METHYLPREDNISOLONE SODIUM SUCC 125 MG IJ SOLR
125.0000 mg | Freq: Once | INTRAMUSCULAR | Status: AC
  Administered 2011-05-08: 125 mg via INTRAMUSCULAR

## 2011-05-08 MED ORDER — ALBUTEROL SULFATE (5 MG/ML) 0.5% IN NEBU
INHALATION_SOLUTION | RESPIRATORY_TRACT | Status: AC
  Filled 2011-05-08: qty 1

## 2011-05-08 MED ORDER — IPRATROPIUM-ALBUTEROL 0.5-2.5 (3) MG/3ML IN SOLN: 3.0000 mL | RESPIRATORY_TRACT | Status: DC

## 2011-05-08 MED ORDER — METHYLPREDNISOLONE SODIUM SUCC 125 MG IJ SOLR
INTRAMUSCULAR | Status: AC
  Filled 2011-05-08: qty 2

## 2011-05-08 MED ORDER — PREDNISONE 10 MG PO TABS: ORAL_TABLET | ORAL | Status: AC

## 2011-05-08 MED ORDER — ALBUTEROL SULFATE (5 MG/ML) 0.5% IN NEBU: INHALATION_SOLUTION | RESPIRATORY_TRACT | Status: DC

## 2011-05-08 NOTE — ED Provider Notes (Signed)
History     CSN: [id number] Arrival date & time: 05/08/2011  5:51 PM   First MD Initiated Contact with Patient 05/08/11 1750      Chief Complaint  Patient presents with  . Shortness of Breath    (Consider location/radiation/quality/duration/timing/severity/associated sxs/prior treatment) HPI Comments: She has had a lifelong history of asthma which had been controlled with Advair and when necessary albuterol. She couldn't afford the Advair, so she stopped it about 2 weeks ago and has been trying to control her asthma with just albuterol however she's had daily coughing with white sputum production, wheezing, and chest tightness. She denies any fever, chills, nasal congestion, rhinorrhea, or sore throat. She thinks that pet dander might also be a possible precipitating factor. She has some Qvar at home which has not been using it.  Patient is a 35 y.o. female presenting with shortness of breath.  Shortness of Breath  Associated symptoms include cough, shortness of breath and wheezing. Pertinent negatives include no fever, no rhinorrhea and no sore throat.    Past Medical History  Diagnosis Date  . Asthma     History reviewed. No pertinent past surgical history.  Family History  Problem Relation Age of Onset  . Glaucoma Mother   . Hypertension Father     History  Substance Use Topics  . Smoking status: Never Smoker   . Smokeless tobacco: Not on file  . Alcohol Use: No    OB History    Grav Para Term Preterm Abortions TAB SAB Ect Mult Living                  Review of Systems  Constitutional: Negative for fever, chills and fatigue.  HENT: Negative for ear pain, congestion, sore throat, rhinorrhea, sneezing, neck stiffness, voice change and postnasal drip.   Eyes: Negative for pain, discharge and redness.  Respiratory: Positive for cough, chest tightness, shortness of breath and wheezing.   Gastrointestinal: Negative for nausea, vomiting, abdominal pain and diarrhea.    Skin: Negative for rash.    Allergies  Penicillins  Home Medications   Current Outpatient Rx  Name Route Sig Dispense Refill  . FLUTICASONE-SALMETEROL 250-50 MCG/DOSE IN AEPB Inhalation Inhale 1 puff into the lungs every 12 (twelve) hours.      . ALBUTEROL SULFATE (5 MG/ML) 0.5% IN NEBU  Use 1 container in nebulizer every 4 hours as needed for wheezing. 60 vial 0  . ALBUTEROL SULFATE HFA 108 (90 BASE) MCG/ACT IN AERS Inhalation Inhale 2 puffs into the lungs every 6 (six) hours as needed. 1 Inhaler 0  . BECLOMETHASONE DIPROPIONATE 80 MCG/ACT IN AERS Inhalation Inhale 2 puffs into the lungs 2 (two) times daily. 1 Inhaler 0  . PREDNISONE 10 MG PO TABS  Take 4 tabs daily for 4 days, 3 tabs daily for 4 days, 2 tabs daily for 4 days, then 1 tab daily for 4 days.  Take all tabs at one time with food and preferably in the morning except for the first dose. 40 tablet 0    BP 122/79  Pulse 79  Temp(Src) 98.4 F (36.9 C) (Oral)  Resp 18  SpO2 100%  LMP 04/28/2011  Physical Exam  Nursing note and vitals reviewed. Constitutional: She appears well-developed and well-nourished. No distress.  HENT:  Head: Normocephalic and atraumatic.  Right Ear: External ear normal.  Left Ear: External ear normal.  Nose: Nose normal.  Mouth/Throat: Oropharynx is clear and moist. No oropharyngeal exudate.  Eyes: Conjunctivae and EOM  are normal. Pupils are equal, round, and reactive to light. Right eye exhibits no discharge. Left eye exhibits no discharge.  Neck: Normal range of motion. Neck supple.  Cardiovascular: Normal rate, regular rhythm and normal heart sounds.   Pulmonary/Chest: No stridor. She is in respiratory distress (she is in mild respiratory distress.). She has wheezes (She has bilateral territory wheezes. Air movement is good and there are no rales or rhonchi.). She has no rales. She exhibits no tenderness.  Lymphadenopathy:    She has no cervical adenopathy.  Skin: Skin is warm and dry. No  rash noted. She is not diaphoretic.    ED Course  Procedures (including critical care time)  The patient was given nebulizer treatment with DuoNeb and then another treatment with just plain albuterol and Solu-Medrol IM. She felt better, although did still have some high-pitched wheezes bilaterally. She had good air movement and was in no respiratory distress. She states she felt a lot better and felt like she could go home with home medications.  Labs Reviewed - No data to display No results found.   1. Asthma       MDM  She developed this asthma attack because she ran out of her Advair. Hopefully starting her on Qvar will keep this under control. I did tell her she needs a primary care doctor or a doctor to manage her asthma on a ongoing basis.        Roque Lias, MD 05/08/11 [phone]

## 2011-05-08 NOTE — ED Notes (Signed)
Pt has run out of her Advair 2 weeks ago. Having shortness of breath the past 1 - 1 1/2 weeks ago. Pt report chest pain with continual burping to get relief. SOB on exertion. Wakes up in the night with shortness of breath. Cough with white production. Dr Lorenz Coaster aware of pts condition.

## 2011-06-05 ENCOUNTER — Encounter (HOSPITAL_COMMUNITY): Payer: Self-pay | Admitting: Emergency Medicine

## 2011-06-05 ENCOUNTER — Emergency Department (INDEPENDENT_AMBULATORY_CARE_PROVIDER_SITE_OTHER)
Admission: EM | Admit: 2011-06-05 | Discharge: 2011-06-05 | Disposition: A | Discharge status: Home / Self Care | Payer: Self-pay | Source: Home / Self Care | Attending: Family Medicine | Admitting: Family Medicine

## 2011-06-05 DIAGNOSIS — J40 Bronchitis, not specified as acute or chronic: Secondary | ICD-10-CM

## 2011-06-05 MED ORDER — ALBUTEROL SULFATE HFA 108 (90 BASE) MCG/ACT IN AERS: 2.0000 | INHALATION_SPRAY | RESPIRATORY_TRACT | Status: DC | PRN

## 2011-06-05 MED ORDER — PREDNISONE 20 MG PO TABS: 20.0000 mg | ORAL_TABLET | Freq: Every day | ORAL | Status: AC

## 2011-06-05 MED ORDER — ALBUTEROL SULFATE (5 MG/ML) 0.5% IN NEBU
5.0000 mg | INHALATION_SOLUTION | Freq: Once | RESPIRATORY_TRACT | Status: AC
  Administered 2011-06-05: 5 mg via RESPIRATORY_TRACT

## 2011-06-05 MED ORDER — ALBUTEROL SULFATE (2.5 MG/3ML) 0.083% IN NEBU: 2.5000 mg | INHALATION_SOLUTION | Freq: Four times a day (QID) | RESPIRATORY_TRACT | Status: DC | PRN

## 2011-06-05 MED ORDER — AZITHROMYCIN 250 MG PO TABS: 250.0000 mg | ORAL_TABLET | Freq: Every day | ORAL | Status: AC

## 2011-06-05 MED ORDER — IPRATROPIUM BROMIDE 0.02 % IN SOLN
0.5000 mg | RESPIRATORY_TRACT | Status: DC
  Administered 2011-06-05: 0.5 mg via RESPIRATORY_TRACT

## 2011-06-05 MED ORDER — GUAIFENESIN-CODEINE 100-10 MG/5ML PO SYRP: 5.0000 mL | ORAL_SOLUTION | Freq: Four times a day (QID) | ORAL | Status: AC | PRN

## 2011-06-05 MED ORDER — ALBUTEROL SULFATE (5 MG/ML) 0.5% IN NEBU
INHALATION_SOLUTION | RESPIRATORY_TRACT | Status: AC
  Filled 2011-06-05: qty 1

## 2011-06-05 NOTE — ED Provider Notes (Signed)
History     CSN: [id number] Arrival date & time: 06/05/2011 11:29 AM   First MD Initiated Contact with Patient 06/05/11 1137      Chief Complaint  Patient presents with  . Asthma    (Consider location/radiation/quality/duration/timing/severity/associated sxs/prior treatment) HPI Comments: Crystal Sampson presents for evaluation of persistent cough, shortness of breath, and wheezing over the last week. 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 She reports onset of fever and chills several days ago, and worsening dyspnea on exertion. She denies fever.   Patient is a 35 y.o. female presenting with shortness of breath. The history is provided by the patient.  Shortness of Breath  The current episode started 3 to 5 days ago. The problem has been unchanged. The problem is moderate. The symptoms are relieved by beta-agonist inhalers. The symptoms are aggravated by activity. Associated symptoms include a fever, cough, shortness of breath and wheezing. Her temperature was unmeasured prior to arrival. Steroid use: inhaled steroids. Her past medical history is significant for asthma and past wheezing.    Past Medical History  Diagnosis Date  . Asthma     History reviewed. No pertinent past surgical history.  Family History  Problem Relation Age of Onset  . Glaucoma Mother   . Hypertension Father     History  Substance Use Topics  . Smoking status: Never Smoker   . Smokeless tobacco: Not on file  . Alcohol Use: No    OB History    Grav Para Term Preterm Abortions TAB SAB Ect Mult Living                  Review of Systems  Constitutional: Positive for fever and chills.  HENT: Negative.   Eyes: Negative.   Respiratory: Positive for cough, shortness of breath and wheezing.   Gastrointestinal: Negative.   Genitourinary: Negative.   Musculoskeletal: Positive for back pain.  Neurological: Negative.     Allergies  Penicillins  Home Medications   Current Outpatient Rx    Name Route Sig Dispense Refill  . ALBUTEROL SULFATE (5 MG/ML) 0.5% IN NEBU  Use 1 container in nebulizer every 4 hours as needed for wheezing. 60 vial 0  . ALBUTEROL SULFATE HFA 108 (90 BASE) MCG/ACT IN AERS Inhalation Inhale 2 puffs into the lungs every 6 (six) hours as needed. 1 Inhaler 0  . FLUTICASONE-SALMETEROL 250-50 MCG/DOSE IN AEPB Inhalation Inhale 1 puff into the lungs every 12 (twelve) hours.      . ALBUTEROL SULFATE HFA 108 (90 BASE) MCG/ACT IN AERS Inhalation Inhale 2 puffs into the lungs every 4 (four) hours as needed for wheezing or shortness of breath. 1 Inhaler 0  . AZITHROMYCIN 250 MG PO TABS Oral Take 1 tablet (250 mg total) by mouth daily. Take two tablets on first day, then one tablet each day for four days 6 tablet 0  . BECLOMETHASONE DIPROPIONATE 80 MCG/ACT IN AERS Inhalation Inhale 2 puffs into the lungs 2 (two) times daily. 1 Inhaler 0  . GUAIFENESIN-CODEINE 100-10 MG/5ML PO SYRP Oral Take 5 mLs by mouth every 6 (six) hours as needed for cough or congestion. 120 mL 0  . PREDNISONE 20 MG PO TABS Oral Take 1 tablet (20 mg total) by mouth daily. 10 tablet 0    BP 115/81  Pulse 84  Temp(Src) 98.3 F (36.8 C) (Oral)  Resp 20  SpO2 96%  LMP 05/28/2011  Physical Exam  Nursing note and vitals reviewed. Constitutional: She is oriented  to person, place, and time. She appears well-developed and well-nourished.  HENT:  Head: Normocephalic and atraumatic.  Eyes: EOM are normal.  Neck: Normal range of motion.  Cardiovascular: Normal rate and regular rhythm.   Pulmonary/Chest: Effort normal. She has wheezes in the right upper field, the right middle field, the right lower field, the left upper field, the left middle field and the left lower field. She has no rhonchi.  Musculoskeletal: Normal range of motion.  Neurological: She is alert and oriented to person, place, and time.  Skin: Skin is warm and dry.  Psychiatric: Her behavior is normal.    ED Course  Procedures  (including critical care time)  Labs Reviewed - No data to display No results found.   1. Bronchitis       MDM  Improved breathing and decreased wheezing after administration of duoneb        Richardo Priest, MD 06/05/11 1248

## 2011-06-05 NOTE — ED Notes (Signed)
Asthma : wheezing , coughing, fever.  Onset 2 weeks ago.  Most recent issues onset Wednesday.

## 2011-07-06 ENCOUNTER — Emergency Department (INDEPENDENT_AMBULATORY_CARE_PROVIDER_SITE_OTHER)
Admission: EM | Admit: 2011-07-06 | Discharge: 2011-07-06 | Disposition: A | Discharge status: Home / Self Care | Payer: Self-pay | Source: Home / Self Care | Attending: Emergency Medicine | Admitting: Emergency Medicine

## 2011-07-06 ENCOUNTER — Encounter (HOSPITAL_COMMUNITY): Payer: Self-pay | Admitting: Emergency Medicine

## 2011-07-06 DIAGNOSIS — T169XXA Foreign body in ear, unspecified ear, initial encounter: Secondary | ICD-10-CM

## 2011-07-06 DIAGNOSIS — T162XXA Foreign body in left ear, initial encounter: Secondary | ICD-10-CM

## 2011-07-06 NOTE — ED Notes (Signed)
HERE WITH POSS EARRING POST STUCK IN LEFT EAR THAT OCCURRED LAST Saturday.PT STATES SHE WAS PLAYING WITH EARRING WHEN POST WENT IN CANAL BUT STATES I TRIED TO GET IT OUT BUT I COULDN'T.NO C/O PAIN OR DIZZINESS.

## 2011-07-06 NOTE — ED Provider Notes (Signed)
History     CSN: [id number]  Arrival date & time 07/06/11  8469   First MD Initiated Contact with Patient 07/06/11 [phone]      Chief Complaint  Patient presents with  . Foreign Body in Ear    (Consider location/radiation/quality/duration/timing/severity/associated sxs/prior treatment) HPI Comments: Pt states she was "messing around" with her left earring a week ago and felt the earring come out and go into her ear canal. Tried removing it with a qtip. No othorrea, ear pain, change in hearing, fevers, vertigo, tinnitus  Patient is a 36 y.o. female presenting with foreign body in ear. The history is provided by the patient.  Foreign Body in Ear This is a new problem. The current episode started more than 1 week ago. The problem occurs constantly. The problem has not changed since onset.Pertinent negatives include no headaches. The symptoms are aggravated by nothing. The symptoms are relieved by nothing.    Past Medical History  Diagnosis Date  . Asthma     History reviewed. No pertinent past surgical history.  Family History  Problem Relation Age of Onset  . Glaucoma Mother   . Hypertension Father     History  Substance Use Topics  . Smoking status: Never Smoker   . Smokeless tobacco: Not on file  . Alcohol Use: No    OB History    Grav Para Term Preterm Abortions TAB SAB Ect Mult Living                  Review of Systems  Constitutional: Negative for fever.  HENT: Negative for hearing loss, ear pain, tinnitus and ear discharge.   Gastrointestinal: Negative for nausea and vomiting.  Neurological: Negative for headaches.    Allergies  Penicillins  Home Medications   Current Outpatient Rx  Name Route Sig Dispense Refill  . ALBUTEROL SULFATE (2.5 MG/3ML) 0.083% IN NEBU Nebulization Take 3 mLs (2.5 mg total) by nebulization every 6 (six) hours as needed for wheezing or shortness of breath. 75 mL 12  . BECLOMETHASONE DIPROPIONATE 80 MCG/ACT IN AERS Inhalation  Inhale 2 puffs into the lungs 2 (two) times daily. 1 Inhaler 0  . FLUTICASONE-SALMETEROL 250-50 MCG/DOSE IN AEPB Inhalation Inhale 1 puff into the lungs every 12 (twelve) hours.        BP 115/76  Pulse 84  Temp(Src) 98.6 F (37 C) (Oral)  Resp 14  SpO2 97%  LMP 06/29/2011  Physical Exam  Nursing note and vitals reviewed. Constitutional: She is oriented to person, place, and time. She appears well-developed and well-nourished. No distress.  HENT:  Head: Normocephalic and atraumatic.  Left Ear: External ear normal.       Earring lodged in cerumen plug left ear.  Eyes: EOM are normal. Pupils are equal, round, and reactive to light.  Neck: Normal range of motion.  Cardiovascular: Regular rhythm.   Pulmonary/Chest: Effort normal and breath sounds normal.  Abdominal: She exhibits no distension.  Musculoskeletal: Normal range of motion.  Neurological: She is alert and oriented to person, place, and time.  Skin: Skin is warm and dry.  Psychiatric: She has a normal mood and affect. Her behavior is normal. Judgment and thought content normal.    ED Course  Procedures (including critical care time)  Labs Reviewed - No data to display No results found.   1. Foreign body of left ear     unable to remove earring with forceps or curette. Will have ear irrigated and re-evaluate.  On reevaluation,  earring out. TM intact. Hearing normal.   MDM    Luiz Blare, MD 07/06/11 [phone]

## 2011-09-27 ENCOUNTER — Encounter (HOSPITAL_BASED_OUTPATIENT_CLINIC_OR_DEPARTMENT_OTHER): Payer: Self-pay | Admitting: *Deleted

## 2011-09-27 ENCOUNTER — Emergency Department (HOSPITAL_BASED_OUTPATIENT_CLINIC_OR_DEPARTMENT_OTHER)
Admission: EM | Admit: 2011-09-27 | Discharge: 2011-09-28 | Disposition: A | Discharge status: Home / Self Care | Payer: Self-pay | Attending: Emergency Medicine | Admitting: Emergency Medicine

## 2011-09-27 DIAGNOSIS — Z79899 Other long term (current) drug therapy: Secondary | ICD-10-CM | POA: Insufficient documentation

## 2011-09-27 DIAGNOSIS — J45909 Unspecified asthma, uncomplicated: Secondary | ICD-10-CM | POA: Insufficient documentation

## 2011-09-27 MED ORDER — ALBUTEROL SULFATE (5 MG/ML) 0.5% IN NEBU
INHALATION_SOLUTION | RESPIRATORY_TRACT | Status: AC
  Administered 2011-09-27: 5 mg
  Filled 2011-09-27: qty 1

## 2011-09-27 MED ORDER — ALBUTEROL SULFATE (5 MG/ML) 0.5% IN NEBU
5.0000 mg | INHALATION_SOLUTION | Freq: Once | RESPIRATORY_TRACT | Status: AC
  Administered 2011-09-28: 5 mg via RESPIRATORY_TRACT
  Filled 2011-09-27: qty 1

## 2011-09-27 MED ORDER — IPRATROPIUM BROMIDE 0.02 % IN SOLN
RESPIRATORY_TRACT | Status: AC
  Administered 2011-09-27: 0.5 mg
  Filled 2011-09-27: qty 2.5

## 2011-09-27 NOTE — Progress Notes (Signed)
Peak flow pre 200 x 3 with good effort, post 230.

## 2011-09-27 NOTE — ED Provider Notes (Signed)
History     CSN: [id number]  Arrival date & time 09/27/11  2237   First MD Initiated Contact with Patient 09/27/11 2340      Chief Complaint  Patient presents with  . Asthma    (Consider location/radiation/quality/duration/timing/severity/associated sxs/prior treatment) HPI This is a 36 year old white female with a history of asthma. She states her breathing worsened this morning about 11 AM. She used her albuterol inhaler throughout the day but never felt adequate relief. When she got home this evening she used her inhaler and then her nebulizer machine again without adequate relief so she came here. She was given an albuterol and Atrovent neb treatment per respiratory therapy per protocol with improvement. She feels better than she did at home but has some remaining dyspnea. She does have a nonproductive cough but no fever.  Past Medical History  Diagnosis Date  . Asthma     History reviewed. No pertinent past surgical history.  Family History  Problem Relation Age of Onset  . Glaucoma Mother   . Hypertension Father     History  Substance Use Topics  . Smoking status: Never Smoker   . Smokeless tobacco: Not on file  . Alcohol Use: No    OB History    Grav Para Term Preterm Abortions TAB SAB Ect Mult Living                  Review of Systems  All other systems reviewed and are negative.    Allergies  Penicillins  Home Medications   Current Outpatient Rx  Name Route Sig Dispense Refill  . ALBUTEROL SULFATE (2.5 MG/3ML) 0.083% IN NEBU Nebulization Take 3 mLs (2.5 mg total) by nebulization every 6 (six) hours as needed for wheezing or shortness of breath. 75 mL 12  . FLUTICASONE-SALMETEROL 250-50 MCG/DOSE IN AEPB Inhalation Inhale 1 puff into the lungs every 12 (twelve) hours.      . BECLOMETHASONE DIPROPIONATE 80 MCG/ACT IN AERS Inhalation Inhale 2 puffs into the lungs 2 (two) times daily. 1 Inhaler 0    BP 121/68  Pulse 100  Temp(Src) 98.2 F (36.8 C)  (Oral)  Resp 20  Ht 5\' 8"  (1.727 m)  Wt 189 lb (85.73 kg)  BMI 28.74 kg/m2  SpO2 100%  LMP 09/13/2011  Physical Exam General: Well-developed, well-nourished female in no acute distress; appearance consistent with age of record HENT: normocephalic, atraumatic Eyes: pupils equal round and reactive to light; extraocular muscles intact Neck: supple Heart: regular rate and rhythm Lungs: Mildly decreased air movement bilaterally with faint expiratory wheezes Abdomen: soft; nondistended; nontender Extremities: No deformity; full range of motion Neurologic: Awake, alert and oriented; motor function intact in all extremities and symmetric; no facial droop Skin: Warm and dry     ED Course  Procedures (including critical care time)     MDM  1:07 AM Air movement significantly improved after her third neb treatment. Dexamethasone given.        Hanley Seamen, MD 09/28/11 [phone]

## 2011-09-27 NOTE — ED Notes (Signed)
Pt reports flare-up of asthma today with nonproductive cough. Pt used inhaler without relief.

## 2011-09-28 MED ORDER — ALBUTEROL SULFATE (5 MG/ML) 0.5% IN NEBU: 5.0000 mg | INHALATION_SOLUTION | Freq: Once | RESPIRATORY_TRACT | Status: DC

## 2011-09-28 MED ORDER — DEXAMETHASONE 4 MG PO TABS
10.0000 mg | ORAL_TABLET | Freq: Once | ORAL | Status: AC
  Administered 2011-09-28: 10 mg via ORAL
  Filled 2011-09-28: qty 2
  Filled 2011-09-28: qty 1

## 2011-09-28 NOTE — ED Notes (Signed)
Pt resting with eyes closed wakes related to cough. resp at bedside for albuterol treatment

## 2011-09-28 NOTE — Discharge Instructions (Signed)

## 2011-09-28 NOTE — ED Notes (Signed)
Ambulates out without difficulty

## 2011-12-15 ENCOUNTER — Encounter (HOSPITAL_COMMUNITY): Payer: Self-pay

## 2011-12-15 ENCOUNTER — Inpatient Hospital Stay (HOSPITAL_COMMUNITY)
Admission: AD | Admit: 2011-12-15 | Discharge: 2011-12-15 | Disposition: A | Discharge status: Home / Self Care | Payer: Self-pay | Source: Ambulatory Visit | Attending: Obstetrics and Gynecology | Admitting: Obstetrics and Gynecology

## 2011-12-15 DIAGNOSIS — A499 Bacterial infection, unspecified: Secondary | ICD-10-CM | POA: Insufficient documentation

## 2011-12-15 DIAGNOSIS — B9689 Other specified bacterial agents as the cause of diseases classified elsewhere: Secondary | ICD-10-CM | POA: Insufficient documentation

## 2011-12-15 DIAGNOSIS — K5900 Constipation, unspecified: Secondary | ICD-10-CM | POA: Insufficient documentation

## 2011-12-15 DIAGNOSIS — N760 Acute vaginitis: Secondary | ICD-10-CM | POA: Insufficient documentation

## 2011-12-15 DIAGNOSIS — N912 Amenorrhea, unspecified: Secondary | ICD-10-CM | POA: Insufficient documentation

## 2011-12-15 HISTORY — DX: Herpesviral infection, unspecified: B00.9

## 2011-12-15 HISTORY — DX: Depression, unspecified: F32.A

## 2011-12-15 HISTORY — DX: Major depressive disorder, single episode, unspecified: F32.9

## 2011-12-15 LAB — URINALYSIS, ROUTINE W REFLEX MICROSCOPIC
Bilirubin Urine: NEGATIVE
Hgb urine dipstick: NEGATIVE
Ketones, ur: NEGATIVE mg/dL
Specific Gravity, Urine: >1.03 — ABNORMAL HIGH (ref 1.005–1.030)

## 2011-12-15 LAB — WET PREP, GENITAL: Trich, Wet Prep: NONE SEEN

## 2011-12-15 MED ORDER — METRONIDAZOLE 500 MG PO TABS: 500.0000 mg | ORAL_TABLET | Freq: Two times a day (BID) | ORAL | Status: AC

## 2011-12-15 NOTE — MAU Provider Note (Signed)
History     CSN: [id number]  Arrival date and time: 12/15/11 0122   First Provider Initiated Contact with Patient 12/15/11 0201      No chief complaint on file.  HPI  Pt is not pregnant with LMP 10/24/2011. 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  Her last period was shorter than normal lasting 3 days.  Pt has had breast tenderness and intermittent abdominal cramping and concerned she may be pregnant. She had Implanon inserted in 2009 and has been trying to get it replaced at the [facility] with difficulty getting an appointment.  Pt has an appointment on July 12.  Pt has not done a pregnancy test at home.  Pt also notes an off white mucousy malodorous vaginal discharge for 2 days.  Pt denies UTI symptoms.  Pt does have constipation.  Past Medical History  Diagnosis Date  . Asthma   . Depression     goes to [facility], quit taking meds  . 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 No past surgeries     Family History  Problem Relation Age of Onset  . Glaucoma Mother   . Hypertension Father   . Other Neg Hx     History  Substance Use Topics  . Smoking status: Never Smoker   . Smokeless tobacco: Not on file  . Alcohol Use: No    Allergies:  Allergies  Allergen Reactions  . Penicillins Swelling    Prescriptions prior to admission  Medication Sig Dispense Refill  . albuterol (PROVENTIL) (2.5 MG/3ML) 0.083% nebulizer solution Take 3 mLs (2.5 mg total) by nebulization every 6 (six) hours as needed for wheezing or shortness of breath.  75 mL  12  . beclomethasone (QVAR) 80 MCG/ACT inhaler Inhale 2 puffs into the lungs 2 (two) times daily.  1 Inhaler  0  . Fluticasone-Salmeterol (ADVAIR) 250-50 MCG/DOSE AEPB Inhale 1 puff into the lungs every 12 (twelve) hours.          Review of Systems  Constitutional: Negative for fever and chills.  Gastrointestinal: Positive for abdominal pain and constipation. Negative for nausea and vomiting.    Genitourinary: Negative for dysuria, urgency and frequency.   Physical Exam   Blood pressure 140/85, pulse 78, temperature 98.9 F (37.2 C), temperature source Oral, resp. rate 16, height 5\' 8"  (1.727 m), weight 194 lb (87.998 kg), last menstrual period 10/24/2011, SpO2 100.00%.  Physical Exam  Vitals reviewed. Constitutional: She is oriented to person, place, and time. She appears well-developed and well-nourished.  HENT:  Head: Normocephalic.  Eyes: Pupils are equal, round, and reactive to light.  Neck: Normal range of motion. Neck supple.  Cardiovascular: Normal rate.   Respiratory: Effort normal.  GI: Soft. She exhibits no distension. There is no tenderness. There is no rebound.  Genitourinary:       Small amount of cream colored vaginal discharge in vault.  Cervix clean NT; uterus NSSC NT; adnexal without palpable enlargement or tenderness  Musculoskeletal: Normal range of motion.  Neurological: She is alert and oriented to person, place, and time.  Skin: Skin is warm and dry.  Psychiatric: She has a normal mood and affect.    MAU Course  Procedures Results for orders placed during the hospital encounter of 12/15/11 (from the past 24 hour(s))  URINALYSIS, ROUTINE W REFLEX MICROSCOPIC     Status: Abnormal   Collection Time   12/15/11  1:28 AM  Component Value Range   Color, Urine YELLOW  YELLOW   APPearance CLEAR  CLEAR   Specific Gravity, Urine >1.030 (*) 1.005 - 1.030   pH 6.0  5.0 - 8.0   Glucose, UA NEGATIVE  NEGATIVE mg/dL   Hgb urine dipstick NEGATIVE  NEGATIVE   Bilirubin Urine NEGATIVE  NEGATIVE   Ketones, ur NEGATIVE  NEGATIVE mg/dL   Protein, ur NEGATIVE  NEGATIVE mg/dL   Urobilinogen, UA 0.2  0.0 - 1.0 mg/dL   Nitrite NEGATIVE  NEGATIVE   Leukocytes, UA NEGATIVE  NEGATIVE  POCT PREGNANCY, URINE     Status: Normal   Collection Time   12/15/11  1:39 AM      Component Value Range   Preg Test, Ur NEGATIVE  NEGATIVE  WET PREP, GENITAL     Status:  Abnormal   Collection Time   12/15/11  1:54 AM      Component Value Range   Yeast Wet Prep HPF POC NONE SEEN  NONE SEEN   Trich, Wet Prep NONE SEEN  NONE SEEN   Clue Cells Wet Prep HPF POC FEW (*) NONE SEEN   WBC, Wet Prep HPF POC FEW (*) NONE SEEN    Assessment and Plan  Amenorrhea- f/u at [facility] if persists BV-Flagyl 500mg  BID for 7 days Constipation- increase fluid, fiber- can take stool softener and Miralax if needed  Fotini Lemus 12/15/2011, 2:11 AM

## 2011-12-15 NOTE — MAU Note (Signed)
Pt states thinks is pregnant. Breast tenderness. Implanon in arm since 2009. States hasn't been able to get into [facility]. LMP 10/24/11 that was shorter than normal. Intermittent abdominal cramping. Off white vaginal discharge that is mucousy, malodorous

## 2011-12-17 LAB — GC/CHLAMYDIA PROBE AMP, GENITAL
Chlamydia, DNA Probe: NEGATIVE
GC Probe Amp, Genital: NEGATIVE

## 2011-12-17 NOTE — MAU Provider Note (Signed)
Agree with above note.  Shaunika Italiano 12/17/2011 1:15 PM

## 2012-03-23 ENCOUNTER — Inpatient Hospital Stay (HOSPITAL_BASED_OUTPATIENT_CLINIC_OR_DEPARTMENT_OTHER)
Admission: EM | Admit: 2012-03-23 | Discharge: 2012-03-26 | DRG: 202 | Disposition: A | Discharge status: Home / Self Care | Payer: MEDICAID | Attending: Internal Medicine | Admitting: Internal Medicine

## 2012-03-23 ENCOUNTER — Emergency Department (HOSPITAL_BASED_OUTPATIENT_CLINIC_OR_DEPARTMENT_OTHER): Payer: Self-pay

## 2012-03-23 ENCOUNTER — Encounter (HOSPITAL_BASED_OUTPATIENT_CLINIC_OR_DEPARTMENT_OTHER): Payer: Self-pay | Admitting: *Deleted

## 2012-03-23 DIAGNOSIS — D72829 Elevated white blood cell count, unspecified: Secondary | ICD-10-CM | POA: Diagnosis present

## 2012-03-23 DIAGNOSIS — Y9210 Unspecified residential institution as the place of occurrence of the external cause: Secondary | ICD-10-CM | POA: Diagnosis present

## 2012-03-23 DIAGNOSIS — F329 Major depressive disorder, single episode, unspecified: Secondary | ICD-10-CM | POA: Diagnosis present

## 2012-03-23 DIAGNOSIS — J45901 Unspecified asthma with (acute) exacerbation: Principal | ICD-10-CM | POA: Diagnosis present

## 2012-03-23 DIAGNOSIS — J45909 Unspecified asthma, uncomplicated: Secondary | ICD-10-CM

## 2012-03-23 DIAGNOSIS — Z91199 Patient's noncompliance with other medical treatment and regimen due to unspecified reason: Secondary | ICD-10-CM

## 2012-03-23 DIAGNOSIS — T380X5A Adverse effect of glucocorticoids and synthetic analogues, initial encounter: Secondary | ICD-10-CM | POA: Diagnosis present

## 2012-03-23 DIAGNOSIS — Z9119 Patient's noncompliance with other medical treatment and regimen: Secondary | ICD-10-CM

## 2012-03-23 DIAGNOSIS — J9620 Acute and chronic respiratory failure, unspecified whether with hypoxia or hypercapnia: Secondary | ICD-10-CM | POA: Diagnosis present

## 2012-03-23 DIAGNOSIS — F419 Anxiety disorder, unspecified: Secondary | ICD-10-CM

## 2012-03-23 DIAGNOSIS — F3289 Other specified depressive episodes: Secondary | ICD-10-CM | POA: Diagnosis present

## 2012-03-23 DIAGNOSIS — R000 Tachycardia, unspecified: Secondary | ICD-10-CM | POA: Diagnosis present

## 2012-03-23 DIAGNOSIS — Z880 Allergy status to penicillin: Secondary | ICD-10-CM

## 2012-03-23 DIAGNOSIS — F411 Generalized anxiety disorder: Secondary | ICD-10-CM | POA: Diagnosis present

## 2012-03-23 DIAGNOSIS — J9600 Acute respiratory failure, unspecified whether with hypoxia or hypercapnia: Secondary | ICD-10-CM

## 2012-03-23 DIAGNOSIS — R630 Anorexia: Secondary | ICD-10-CM | POA: Diagnosis present

## 2012-03-23 DIAGNOSIS — T486X5A Adverse effect of antiasthmatics, initial encounter: Secondary | ICD-10-CM | POA: Diagnosis present

## 2012-03-23 DIAGNOSIS — Z79899 Other long term (current) drug therapy: Secondary | ICD-10-CM

## 2012-03-23 DIAGNOSIS — F32A Depression, unspecified: Secondary | ICD-10-CM

## 2012-03-23 LAB — CBC WITH DIFFERENTIAL/PLATELET
Basophils Relative: 0 % (ref 0–1)
Eosinophils Absolute: 1.6 10*3/uL — ABNORMAL HIGH (ref 0.0–0.7)
Hemoglobin: 12.8 g/dL (ref 12.0–15.0)
MCH: 31.6 pg (ref 26.0–34.0)
MCHC: 34.8 g/dL (ref 30.0–36.0)
Monocytes Relative: 6 % (ref 3–12)
Neutrophils Relative %: 65 % (ref 43–77)
Platelets: 350 10*3/uL (ref 150–400)
RDW: 11.8 % (ref 11.5–15.5)

## 2012-03-23 LAB — BASIC METABOLIC PANEL
BUN: 12 mg/dL (ref 6–23)
Calcium: 9.8 mg/dL (ref 8.4–10.5)
Creatinine, Ser: 0.7 mg/dL (ref 0.50–1.10)
GFR calc Af Amer: >90 mL/min (ref >90)
GFR calc Af Amer: >90 mL/min (ref >90)
GFR calc non Af Amer: >90 mL/min (ref >90)
GFR calc non Af Amer: >90 mL/min (ref >90)
Glucose, Bld: 187 mg/dL — ABNORMAL HIGH (ref 70–99)
Potassium: 4 mEq/L (ref 3.5–5.1)
Potassium: 4.1 mEq/L (ref 3.5–5.1)
Sodium: 138 mEq/L (ref 135–145)

## 2012-03-23 LAB — POCT I-STAT 3, ART BLOOD GAS (G3+)
Patient temperature: 98.8
pO2, Arterial: 85 mmHg (ref 80.0–100.0)

## 2012-03-23 LAB — MRSA PCR SCREENING: MRSA by PCR: NEGATIVE

## 2012-03-23 LAB — PHOSPHORUS: Phosphorus: 2.4 mg/dL (ref 2.3–4.6)

## 2012-03-23 MED ORDER — PANTOPRAZOLE SODIUM 40 MG PO TBEC
40.0000 mg | DELAYED_RELEASE_TABLET | Freq: Every day | ORAL | Status: DC
  Administered 2012-03-23 – 2012-03-25 (×3): 40 mg via ORAL
  Filled 2012-03-23 (×3): qty 1

## 2012-03-23 MED ORDER — SODIUM CHLORIDE 0.9 % IJ SOLN
3.0000 mL | Freq: Two times a day (BID) | INTRAMUSCULAR | Status: DC
  Administered 2012-03-23 – 2012-03-24 (×3): 3 mL via INTRAVENOUS

## 2012-03-23 MED ORDER — ALBUTEROL SULFATE (5 MG/ML) 0.5% IN NEBU
INHALATION_SOLUTION | RESPIRATORY_TRACT | Status: AC
  Administered 2012-03-23: 15 mg
  Filled 2012-03-23: qty 3

## 2012-03-23 MED ORDER — SODIUM CHLORIDE 0.9 % IJ SOLN: 3.0000 mL | INTRAMUSCULAR | Status: DC | PRN

## 2012-03-23 MED ORDER — ALBUTEROL SULFATE (5 MG/ML) 0.5% IN NEBU
INHALATION_SOLUTION | RESPIRATORY_TRACT | Status: AC
  Administered 2012-03-23: 10 mg
  Filled 2012-03-23: qty 2

## 2012-03-23 MED ORDER — ENOXAPARIN SODIUM 40 MG/0.4ML ~~LOC~~ SOLN
40.0000 mg | SUBCUTANEOUS | Status: DC
  Administered 2012-03-24 – 2012-03-25 (×2): 40 mg via SUBCUTANEOUS
  Filled 2012-03-23 (×4): qty 0.4

## 2012-03-23 MED ORDER — ALBUTEROL SULFATE (5 MG/ML) 0.5% IN NEBU
2.5000 mg | INHALATION_SOLUTION | RESPIRATORY_TRACT | Status: DC
  Administered 2012-03-23 – 2012-03-24 (×8): 2.5 mg via RESPIRATORY_TRACT
  Filled 2012-03-23 (×7): qty 0.5

## 2012-03-23 MED ORDER — SODIUM CHLORIDE 0.9 % IV SOLN: 250.0000 mL | INTRAVENOUS | Status: DC | PRN

## 2012-03-23 MED ORDER — ACETAMINOPHEN 325 MG PO TABS: 650.0000 mg | ORAL_TABLET | Freq: Four times a day (QID) | ORAL | Status: DC | PRN

## 2012-03-23 MED ORDER — ALBUTEROL SULFATE (5 MG/ML) 0.5% IN NEBU
5.0000 mg | INHALATION_SOLUTION | Freq: Once | RESPIRATORY_TRACT | Status: AC
  Administered 2012-03-23: 5 mg via RESPIRATORY_TRACT

## 2012-03-23 MED ORDER — ACETAMINOPHEN 650 MG RE SUPP: 650.0000 mg | Freq: Four times a day (QID) | RECTAL | Status: DC | PRN

## 2012-03-23 MED ORDER — LEVALBUTEROL HCL 0.63 MG/3ML IN NEBU
0.6300 mg | INHALATION_SOLUTION | RESPIRATORY_TRACT | Status: DC | PRN
  Administered 2012-03-23: 0.63 mg via RESPIRATORY_TRACT
  Filled 2012-03-23: qty 3

## 2012-03-23 MED ORDER — BENZONATATE 100 MG PO CAPS
100.0000 mg | ORAL_CAPSULE | Freq: Three times a day (TID) | ORAL | Status: DC | PRN
  Administered 2012-03-23: 100 mg via ORAL
  Filled 2012-03-23: qty 1

## 2012-03-23 MED ORDER — ALBUTEROL SULFATE (5 MG/ML) 0.5% IN NEBU
2.5000 mg | INHALATION_SOLUTION | RESPIRATORY_TRACT | Status: DC | PRN
  Administered 2012-03-23 – 2012-03-25 (×2): 2.5 mg via RESPIRATORY_TRACT
  Filled 2012-03-23 (×5): qty 0.5

## 2012-03-23 MED ORDER — FLUTICASONE-SALMETEROL 250-50 MCG/DOSE IN AEPB
1.0000 | INHALATION_SPRAY | Freq: Two times a day (BID) | RESPIRATORY_TRACT | Status: DC
  Administered 2012-03-23 – 2012-03-26 (×6): 1 via RESPIRATORY_TRACT
  Filled 2012-03-23: qty 14

## 2012-03-23 MED ORDER — ONDANSETRON HCL 4 MG/2ML IJ SOLN: 4.0000 mg | Freq: Four times a day (QID) | INTRAMUSCULAR | Status: DC | PRN

## 2012-03-23 MED ORDER — METHYLPREDNISOLONE SODIUM SUCC 125 MG IJ SOLR
125.0000 mg | Freq: Once | INTRAMUSCULAR | Status: AC
  Administered 2012-03-23: 125 mg via INTRAVENOUS
  Filled 2012-03-23: qty 2

## 2012-03-23 MED ORDER — SODIUM CHLORIDE 0.9 % IJ SOLN: 3.0000 mL | Freq: Two times a day (BID) | INTRAMUSCULAR | Status: DC

## 2012-03-23 MED ORDER — HYDROCODONE-ACETAMINOPHEN 5-325 MG PO TABS
1.0000 | ORAL_TABLET | ORAL | Status: DC | PRN
  Administered 2012-03-24: 1 via ORAL
  Filled 2012-03-23: qty 1

## 2012-03-23 MED ORDER — METHYLPREDNISOLONE SODIUM SUCC 125 MG IJ SOLR
60.0000 mg | Freq: Four times a day (QID) | INTRAMUSCULAR | Status: AC
  Administered 2012-03-23 – 2012-03-24 (×7): 60 mg via INTRAVENOUS
  Filled 2012-03-23 (×9): qty 0.96
  Filled 2012-03-23: qty 2
  Filled 2012-03-23: qty 0.96

## 2012-03-23 MED ORDER — ALBUTEROL SULFATE (5 MG/ML) 0.5% IN NEBU
2.5000 mg | INHALATION_SOLUTION | Freq: Once | RESPIRATORY_TRACT | Status: AC
  Administered 2012-03-23: 2.5 mg via RESPIRATORY_TRACT

## 2012-03-23 MED ORDER — GUAIFENESIN-DM 100-10 MG/5ML PO SYRP
5.0000 mL | ORAL_SOLUTION | ORAL | Status: DC | PRN
  Filled 2012-03-23: qty 5

## 2012-03-23 MED ORDER — GUAIFENESIN ER 600 MG PO TB12
600.0000 mg | ORAL_TABLET | Freq: Two times a day (BID) | ORAL | Status: DC
  Administered 2012-03-23 – 2012-03-24 (×3): 600 mg via ORAL
  Filled 2012-03-23 (×4): qty 1

## 2012-03-23 MED ORDER — ONDANSETRON HCL 4 MG PO TABS: 4.0000 mg | ORAL_TABLET | Freq: Four times a day (QID) | ORAL | Status: DC | PRN

## 2012-03-23 MED ORDER — IPRATROPIUM BROMIDE 0.02 % IN SOLN
0.5000 mg | Freq: Once | RESPIRATORY_TRACT | Status: AC
  Administered 2012-03-23: 0.5 mg via RESPIRATORY_TRACT

## 2012-03-23 MED ORDER — ALBUTEROL SULFATE (5 MG/ML) 0.5% IN NEBU
15.0000 mg | INHALATION_SOLUTION | Freq: Once | RESPIRATORY_TRACT | Status: AC
  Administered 2012-03-23: 15 mg via RESPIRATORY_TRACT

## 2012-03-23 MED ORDER — DOCUSATE SODIUM 100 MG PO CAPS
100.0000 mg | ORAL_CAPSULE | Freq: Two times a day (BID) | ORAL | Status: DC
  Administered 2012-03-24 – 2012-03-25 (×2): 100 mg via ORAL
  Filled 2012-03-23 (×8): qty 1

## 2012-03-23 NOTE — ED Notes (Signed)
Assigned bed [id number]@ [facility], RN notified.

## 2012-03-23 NOTE — ED Notes (Signed)
Pt states she has a hx of asthma and she has been having trouble breathing since earlier today. No relief with usual meds. Presents in moderate resp distress. Taken to ED10. Sarah, RRT in to evaluate. Pt sats 91% on RA-placed on O2 at 2L/[place]

## 2012-03-23 NOTE — Consult Note (Signed)
Name: Crystal Sampson MRN: [id number] DOB: [birth year]    LOS: 0  PULMONARY / CRITICAL CARE MEDICINE CONSULT  HPI:   36 years old female with PMH relevant for asthma and depression. Her asthma control apparently had been ok after starting Advair. She ran out of her medication and was having more nocturnal symptoms. She presents this time with two days history of worsening wheezing, cough and SOB. Denies fever, chest pain. Her cough is only productive of clear sputum. At the time of my examination the patient is awake, alert, oriented x 3, hemodynamically stable, saturating 98% on 2 L [place]. She reports some improvement compared to when she first came.   Past Medical History  Diagnosis Date  . Asthma   . Depression     goes to [facility], quit taking meds  . 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 No past surgeries    Prior to Admission medications   Medication Sig Start Date End Date Taking? Authorizing Provider  albuterol (PROVENTIL) (2.5 MG/3ML) 0.083% nebulizer solution Take 3 mLs (2.5 mg total) by nebulization every 6 (six) hours as needed for wheezing or shortness of breath. 06/05/11 06/04/12 Yes Delanna Notice, MD  beclomethasone (QVAR) 80 MCG/ACT inhaler Inhale 2 puffs into the lungs 2 (two) times daily. 05/08/11 05/07/12  Reuben Likes, MD  Fluticasone-Salmeterol (ADVAIR) 250-50 MCG/DOSE AEPB Inhale 1 puff into the lungs every 12 (twelve) hours.      Historical Provider, MD   Allergies Allergies  Allergen Reactions  . Penicillins Swelling    Family History Family History  Problem Relation Age of Onset  . Glaucoma Mother   . Hypertension Father   . Other Neg Hx    Social History  reports that she has never smoked. She does not have any smokeless tobacco history on file. She reports that she does not drink alcohol or use illicit drugs.  Review Of Systems:  All systems reviewed and found negative except for what I  mentioned in the HPI..   Current Status:  Vital Signs: Temp:  [98.4 F (36.9 C)-98.8 F (37.1 C)] 98.5 F (36.9 C) (09/29 0440) Pulse Rate:  [108-118] 118  (09/29 0440) Resp:  [23-36] 24  (09/29 0440) BP: (120-135)/(89-98) 135/89 mmHg (09/29 0440) SpO2:  [91 %-100 %] 96 % (09/29 0543) Weight:  [191 lb 12.8 oz (87 kg)-192 lb (87.091 kg)] 191 lb 12.8 oz (87 kg) (09/29 0440)  Physical Examination: General:  Mild respiratory distress Neuro:  Awake, alert, orited x 3, nonfocal HEENT:  PERRL, pink conjunctivae, moist membranes Neck:  Supple, no JVD   Cardiovascular:  RRR, no M/R/G Lungs:  Bilateral diminished air entry, diffuse wheezing bilaterally, no crackles. Abdomen:  Soft, nontender, nondistended, bowel sounds present Musculoskeletal:  Moves all extremities, no pedal edema Skin:  No rash    Lab 03/23/12 0254  PHART 7.361  PCO2ART 45.4*  PO2ART 85.0  HCO3 25.7*  O2SAT 96.0    Lab 03/23/12 0050  NA 142  K 4.0  CL 103  CO2 26  BUN 12  CREATININE 0.70  CALCIUM 9.7  MG --  PHOS --    Lab 03/23/12 0050  HGB 12.8  HCT 36.8  PLT 350  INR --  APTT --     Lab 03/23/12 0050  WBC 15.9*  PROCALCITON --    Active Problems:  Asthma exacerbation   ASSESSMENT AND PLAN: 36 years old  with asthma presents with an acute exacerbation. ABG: 7.36 / 45 / 85 / 25 / 96%. The patient has an elevated WBC but no fever or significant sputum production. Her X ray showed hyperinflation but no acute pneumonic infiltrate. Already on albuterol nebulizer q 2 hrs and IV steroids. Patient reports improvement since admission.  Recommendations: 1) Continue methylprednisolone 60 mg IV q 6 hrs 2) Continue Albuterol / Xopenex. 3) Agree with no antibiotics since the X ray did not show any acute infiltrates. 4) Continue Advair 5) Patient will benefit from pulmonary outpatient follow up after discharge.     Overton Mam, M.D. Pulmonary and Critical Care Medicine [facility] Pager: [phone]  03/23/2012, 6:11 AM

## 2012-03-23 NOTE — Progress Notes (Signed)
Name: Crystal Sampson MRN: [id number] DOB: December 06, 1975    LOS: 0  PULMONARY / CRITICAL CARE MEDICINE CONSULT  HPI:   36 years old female with PMH relevant for asthma and depression. Her asthma control apparently had been ok after starting Advair. She ran out of her medication and was having more nocturnal symptoms. She presents this time with two days history of worsening wheezing, cough and SOB. Denies fever, chest pain. Her cough is only productive of clear sputum. At the time of my examination the patient is awake, alert, oriented x 3, hemodynamically stable, saturating 98% on 2 L [place]. She reports some improvement compared to when she first came.     Current Status: She reluctantly admits she isn't well yet. Discussed med cost. She [place] at hotel [place] with significant exposure to people, but doesn't think she has a cold. Fatalistic- "This was just something that was going to happen."  Vital Signs: Temp:  [98.2 F (36.8 C)-98.8 F (37.1 C)] 98.2 F (36.8 C) (09/29 0736) Pulse Rate:  [108-118] 109  (09/29 0735) Resp:  [21-36] 21  (09/29 0735) BP: (120-135)/(69-98) 120/69 mmHg (09/29 0735) SpO2:  [91 %-100 %] 98 % (09/29 0849) Weight:  [86.1 kg (189 lb 13.1 oz)-87.091 kg (192 lb)] 86.1 kg (189 lb 13.1 oz) (09/29 0440)  Physical Examination: General:  Mild respiratory distress Neuro:  Awake, alert, orited x 3, nonfocal HEENT:  PERRL, pink conjunctivae, moist membranes Neck:  Supple, no JVD   Cardiovascular:  RRR, no M/R/G Lungs:  Bracing tripod posture with arms, diffuse wheeze. Abdomen:  Soft, nontender, nondistended, bowel sounds present Musculoskeletal:  Moves all extremities, no pedal edema Skin:  No rash    Lab 03/23/12 0254  PHART 7.361  PCO2ART 45.4*  PO2ART 85.0  HCO3 25.7*  O2SAT 96.0    Lab 03/23/12 0635 03/23/12 0050  NA 138 142  K 4.1 4.0  CL 103 103  CO2 21 26  BUN 10 12  CREATININE 0.60 0.70  CALCIUM 9.8 9.7  MG 1.9 --  PHOS 2.4 --    Lab  03/23/12 0050  HGB 12.8  HCT 36.8  PLT 350  INR --  APTT --     Lab 03/23/12 0050  WBC 15.9*  PROCALCITON --    Active Problems:  Asthma exacerbation   ASSESSMENT AND PLAN: 36 years old with asthma presents with an acute exacerbation. ABG: 7.36 / 45 / 85 / 25 / 96%. The patient has an elevated WBC but no fever or significant sputum production. Her X ray showed hyperinflation but no acute pneumonic infiltrate. Already on albuterol nebulizer q 2 hrs and IV steroids. Patient reports improvement since admission, but probably not much. She denies wheezing when well. Cost of home meds will be an issue.  Recommendations: 1) Continue methylprednisolone 60 mg IV q 6 hrs 2) Continue Albuterol / Xopenex. 3) Agree with no antibiotics since the X ray did not show any acute infiltrates. 4) Continue Advair 5) Patient will benefit from pulmonary outpatient follow up after discharge.   CD Gorman Safi, MD PCCM  03/23/2012, 11:09 AM

## 2012-03-23 NOTE — H&P (Signed)
PCP:  none    Chief Complaint:   wheezing  HPI: Crystal Sampson is a 36 y.o. female   has a past medical history of Asthma; Depression; and HSV (herpes simplex virus) infection.   Presented with  She run out of Advair she has been having nocturnal exacerbations for some time. Yesterday had extreme shortness of breath and went to ER she received continuas nebs and solumedrol and started to feel a bit better. 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  Patient able to speak in full sentences but does not appear comfortable. She is sitting up in bed.   Review of Systems:    Pertinent positives include:  Chills, wheezing,  shortness of breath at rest.  dyspnea on exertion,   Constitutional:  No weight loss, night sweats, Fevers, fatigue, weight loss  HEENT:  No headaches, Difficulty swallowing,Tooth/dental problems,Sore throat,  No sneezing, itching, ear ache, nasal congestion, post nasal drip,  Cardio-vascular:  No chest pain, Orthopnea, PND, anasarca, dizziness, palpitations.no Bilateral lower extremity swelling  GI:  No heartburn, indigestion, abdominal pain, nausea, vomiting, diarrhea, change in bowel habits, loss of appetite, melena, blood in stool, hematemesis Resp:  No excess mucus, no productive cough, No non-productive cough, No coughing up of blood.No change in color of mucus.No wheezing. Skin:  no rash or lesions. No jaundice GU:  no dysuria, change in color of urine, no urgency or frequency. No straining to urinate.  No flank pain.  Musculoskeletal:  No joint pain or no joint swelling. No decreased range of motion. No back pain.  Psych:  No change in mood or affect. No depression or anxiety. No memory loss.  Neuro: no localizing neurological complaints, no tingling, no weakness, no double vision, no gait abnormality, no slurred speech, no confusion  Otherwise ROS are negative  except for above, 10 systems were reviewed  Past Medical History: Past Medical History  Diagnosis Date  . Asthma   . Depression     goes to [facility], quit taking meds  . 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 No past surgeries      Medications: Prior to Admission medications   Medication Sig Start Date End Date Taking? Authorizing Provider  albuterol (PROVENTIL) (2.5 MG/3ML) 0.083% nebulizer solution Take 3 mLs (2.5 mg total) by nebulization every 6 (six) hours as needed for wheezing or shortness of breath. 06/05/11 06/04/12 Yes Delanna Notice, MD  beclomethasone (QVAR) 80 MCG/ACT inhaler Inhale 2 puffs into the lungs 2 (two) times daily. 05/08/11 05/07/12  Reuben Likes, MD  Fluticasone-Salmeterol (ADVAIR) 250-50 MCG/DOSE AEPB Inhale 1 puff into the lungs every 12 (twelve) hours.      Historical Provider, MD    Allergies:   Allergies  Allergen Reactions  . Penicillins Swelling    Social History:  Ambulatory  independently  Lives at  home   reports that she has never smoked. She does not have any smokeless tobacco history on file. She reports that she does not drink alcohol or use illicit drugs.   Family History: family history includes Glaucoma in her mother and Hypertension in her father.  There is no history of Other.    Physical Exam: Patient Vitals for the past 24 hrs:  BP Temp Temp src Pulse Resp SpO2 Height Weight  03/23/12 0218 -  98.8 F (37.1 C) Oral 117  23  100 % - -  03/23/12 0050 - - - - - 91 % - -  03/23/12 0043 120/98 mmHg 98.4 F (36.9 C) Oral 108  36  91 % 5\' 8"  (1.727 m) 87.091 kg (192 lb)    1. General:  in No Acute distress 2. Psychological: Alert and  Oriented 3. Head/ENT:   Moist  Mucous Membranes                          Head Non traumatic, neck supple                          Normal  Dentition 4. SKIN: normal Skin turgor,  Skin clean Dry and intact no rash 5. Heart: rapid  but Regular rate and rhythm no Murmur, Rub or gallop 6. Lungs: wheezes bilaterally inspiratory and expiratory 7. Abdomen: Soft, non-tender, Non distended 8. Lower extremities: no clubbing, cyanosis, or edema 9. Neurologically Grossly intact, moving all 4 extremities equally 10. MSK: Normal range of motion  body mass index is 29.19 kg/(m^2).   Labs on Admission:   [facility] An 03/23/12 0050  NA 142  K 4.0  CL 103  CO2 26  GLUCOSE 86  BUN 12  CREATININE 0.70  CALCIUM 9.7  MG --  PHOS --   No results found for this basename: AST:2,ALT:2,ALKPHOS:2,BILITOT:2,PROT:2,ALBUMIN:2 in the last 72 hours No results found for this basename: LIPASE:2,AMYLASE:2 in the last 72 hours  Basename 03/23/12 0050  WBC 15.9*  NEUTROABS 10.3*  HGB 12.8  HCT 36.8  MCV 90.9  PLT 350   No results found for this basename: CKTOTAL:3,CKMB:3,CKMBINDEX:3,TROPONINI:3 in the last 72 hours No results found for this basename: TSH,T4TOTAL,FREET3,T3FREE,THYROIDAB in the last 72 hours No results found for this basename: VITAMINB12:2,FOLATE:2,FERRITIN:2,TIBC:2,IRON:2,RETICCTPCT:2 in the last 72 hours No results found for this basename: HGBA1C    Estimated Creatinine Clearance: 113.4 ml/min (by C-G formula based on Cr of 0.7). ABG    Component Value Date/Time   PHART 7.361 03/23/2012 0254   HCO3 25.7* 03/23/2012 0254   TCO2 27 03/23/2012 0254   O2SAT 96.0 03/23/2012 0254     No results found for this basename: DDIMER      Cultures: No results found for this basename: sdes, specrequest, cult, reptstatus       Radiological Exams on Admission: Dg Chest Portable 1 View  03/23/2012  *RADIOLOGY REPORT*  Clinical Data: Shortness of breath.  PORTABLE CHEST - 1 VIEW  Comparison: 11/05/2010  Findings: Single view of the chest was obtained.  There is no focal airspace disease or edema.  Heart and mediastinum are within normal limits and stable.  The trachea is midline.  Bony structures are intact.  There may be mild  peribronchial thickening in the lower lungs without focal disease.  IMPRESSION: No acute chest findings.   Original Report Authenticated By: Richarda Overlie, M.D.     Chart has been reviewed  Assessment/Plan  36 yo w hx of asthma here with exacerbation no evidence of underlining infection.   Present on Admission:  .Asthma exacerbation - will admit to step down given severety of disease, spoke to CCM, will give high dose steroids, restart Advair, consider adding antibiotic coverage. Albuterol or xopenex inhaler.    Prophylaxis:  Lovenox, Protonix  CODE STATUS: FULL CODE  Other plan as per orders.  I have spent a total of 55 min on  this admission  Daffney Greenly 03/23/2012, 5:27 AM

## 2012-03-23 NOTE — Progress Notes (Signed)
[facility]  Pt admitted overnight for acute on chronic respiratory failure due to asthma. She continues to feel short of breath and complains of chest tightness to me- after 2 back to back Neb treatments, she is noted to be feeling better. She admits to me that she has a hacking cough but is unable to bring up the mucous. She has no sinus drainage and no flu like symptoms. On exam (prior to the Nebs) she is noted to have diffuse wheezing, tachycardia and tachypnea.    A/P   Asthma exacerbation/ Acute respiratory failure Continue aggressive treatment with Nebs, IV steroids, O2 and antibiotics. Add Mucinex and flutter valve- advised on the importance of maintaing hydration.  Cont to follow in SDU.   Calvert Cantor, MD [phone]

## 2012-03-23 NOTE — Progress Notes (Addendum)
36 yo F with Hx of Asthma, never had to be intubated but had to be admitted to ICU in the past.  She is continuing to have sever wheezing after solumedrol, and continues nebs. Asked Dr. Nicanor Alcon to order ABG and if patient remains stable to admit to step down.    Crystal Sampson 2:37 AM

## 2012-03-23 NOTE — ED Provider Notes (Signed)
History  This chart was scribed for Crystal Snavely Smitty Cords, MD by Shari Heritage. The patient was seen in room MH10/MH10. Patient's care was started at 0037.     CSN: [id number]  Arrival date & time 03/23/12  0035   First MD Initiated Contact with Patient 03/23/12 0037      Chief Complaint  Patient presents with  . Shortness of Breath     Patient is a 36 y.o. female presenting with shortness of breath. The history is provided by the patient. No language interpreter was used.  Shortness of Breath  The current episode started today. The problem occurs continuously. The problem has been unchanged. The problem is moderate. Nothing relieves the symptoms. Associated symptoms include shortness of breath and wheezing. Pertinent negatives include no chest pain, no fever and no cough. She has not inhaled smoke recently. She has had prior hospitalizations. She has had prior ICU admissions. She has had no prior intubations. Her past medical history is significant for asthma and past wheezing.    HPI Comments: Crystal Sampson is a 36 y.o. female with a history of asthma who presents to the Emergency Department complaining of sudden onset continuous, SOB and wheezing that began several hours ago today. There is no known precipitant. Patient states that current symptoms are typical of past asthma attack. Patient denies cough, fever or chest pain. Patient tried Advair multiple times, but says she got no relief. Patient has had previous ICU admissions with no intubations. No smoke inhalation. Patient has a history of depression and HSV.   Past Medical History  Diagnosis Date  . Asthma   . Depression     goes to [facility], quit taking meds  . 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 No past surgeries     Family History  Problem Relation Age of Onset  . Glaucoma Mother   . Hypertension Father   . Other Neg Hx     History  Substance  Use Topics  . Smoking status: Never Smoker   . Smokeless tobacco: Not on file  . Alcohol Use: No    OB History    Grav Para Term Preterm Abortions TAB SAB Ect Mult Living   3 2 2  1 1    2       Review of Systems  Constitutional: Negative for fever.  Respiratory: Positive for shortness of breath and wheezing. Negative for cough.   Cardiovascular: Negative for chest pain.  All other systems reviewed and are negative.    Allergies  Penicillins  Home Medications   Current Outpatient Rx  Name Route Sig Dispense Refill  . ALBUTEROL SULFATE (2.5 MG/3ML) 0.083% IN NEBU Nebulization Take 3 mLs (2.5 mg total) by nebulization every 6 (six) hours as needed for wheezing or shortness of breath. 75 mL 12  . BECLOMETHASONE DIPROPIONATE 80 MCG/ACT IN AERS Inhalation Inhale 2 puffs into the lungs 2 (two) times daily. 1 Inhaler 0  . FLUTICASONE-SALMETEROL 250-50 MCG/DOSE IN AEPB Inhalation Inhale 1 puff into the lungs every 12 (twelve) hours.        There were no vitals taken for this visit.  Physical Exam  Nursing note and vitals reviewed. Constitutional: She is oriented to person, place, and time. She appears well-developed and well-nourished. No distress.  HENT:  Head: Normocephalic and atraumatic.  Eyes: Conjunctivae normal and EOM are normal. Pupils are equal, round, and reactive to  light.  Neck: Normal range of motion.  Cardiovascular: Normal rate, regular rhythm and normal heart sounds.   No murmur heard. Pulmonary/Chest: Effort normal. No stridor. No respiratory distress. She has wheezes (expiratory).       Patient is speaking in short sentences.  Abdominal: Soft. Bowel sounds are normal. She exhibits no distension. There is no tenderness. There is no rebound.  Musculoskeletal: Normal range of motion.  Neurological: She is alert and oriented to person, place, and time.  Skin: Skin is warm and dry.  Psychiatric: She has a normal mood and affect. Her behavior is normal.    ED  Course  Procedures (including critical care time) DIAGNOSTIC STUDIES: Oxygen Saturation is 91% on room air, low by my interpretation.    COORDINATION OF CARE: 12:50am- Patient informed of current plan for treatment and evaluation and agrees with plan at this time.      Labs Reviewed - No data to display No results found.   No diagnosis found.    MDM  CRITICAL CARE Performed by: Jasmine Awe   Total critical care time: 60 minutes  Critical care time was exclusive of separately billable procedures and treating other patients.  Critical care was necessary to treat or prevent imminent or life-threatening deterioration.  Critical care was time spent personally by me on the following activities: development of treatment plan with patient and/or surrogate as well as nursing, discussions with consultants, evaluation of patient's response to treatment, examination of patient, obtaining history from patient or surrogate, ordering and performing treatments and interventions, ordering and review of laboratory studies, ordering and review of radiographic studies, pulse oximetry and re-evaluation of patient's condition.    MDM Reviewed: nursing note and vitals Reviewed previous: x-ray Interpretation: labs and x-ray Total time providing critical care: 30-74 minutes. This excludes time spent performing separately reportable procedures and services. Consults: admitting MD     I personally performed the services described in this documentation, which was scribed in my presence. The recorded information has been reviewed and considered.    Jasmine Awe, MD 03/23/12 [phone]

## 2012-03-24 DIAGNOSIS — F32A Depression, unspecified: Secondary | ICD-10-CM | POA: Diagnosis present

## 2012-03-24 DIAGNOSIS — Z9119 Patient's noncompliance with other medical treatment and regimen: Secondary | ICD-10-CM

## 2012-03-24 DIAGNOSIS — D72829 Elevated white blood cell count, unspecified: Secondary | ICD-10-CM | POA: Diagnosis present

## 2012-03-24 DIAGNOSIS — F419 Anxiety disorder, unspecified: Secondary | ICD-10-CM | POA: Diagnosis present

## 2012-03-24 DIAGNOSIS — F341 Dysthymic disorder: Secondary | ICD-10-CM

## 2012-03-24 DIAGNOSIS — F329 Major depressive disorder, single episode, unspecified: Secondary | ICD-10-CM | POA: Diagnosis present

## 2012-03-24 DIAGNOSIS — Z91199 Patient's noncompliance with other medical treatment and regimen due to unspecified reason: Secondary | ICD-10-CM

## 2012-03-24 DIAGNOSIS — R000 Tachycardia, unspecified: Secondary | ICD-10-CM

## 2012-03-24 LAB — COMPREHENSIVE METABOLIC PANEL
ALT: 11 U/L (ref 0–35)
AST: 15 U/L (ref 0–37)
Albumin: 3.8 g/dL (ref 3.5–5.2)
Alkaline Phosphatase: 49 U/L (ref 39–117)
Calcium: 9.9 mg/dL (ref 8.4–10.5)
GFR calc Af Amer: >90 mL/min (ref >90)
Glucose, Bld: 169 mg/dL — ABNORMAL HIGH (ref 70–99)
Potassium: 4.3 mEq/L (ref 3.5–5.1)
Sodium: 137 mEq/L (ref 135–145)
Total Protein: 7.8 g/dL (ref 6.0–8.3)

## 2012-03-24 LAB — CBC
HCT: 37.3 % (ref 36.0–46.0)
Hemoglobin: 12.7 g/dL (ref 12.0–15.0)
MCHC: 34 g/dL (ref 30.0–36.0)
MCV: 92.6 fL (ref 78.0–100.0)
RDW: 12.8 % (ref 11.5–15.5)

## 2012-03-24 LAB — TSH: TSH: 0.337 u[IU]/mL — ABNORMAL LOW (ref 0.350–4.500)

## 2012-03-24 MED ORDER — ALBUTEROL SULFATE (5 MG/ML) 0.5% IN NEBU
2.5000 mg | INHALATION_SOLUTION | Freq: Four times a day (QID) | RESPIRATORY_TRACT | Status: DC
  Administered 2012-03-24 – 2012-03-26 (×6): 2.5 mg via RESPIRATORY_TRACT
  Filled 2012-03-24 (×7): qty 0.5

## 2012-03-24 MED ORDER — INFLUENZA VIRUS VACC SPLIT PF IM SUSP
0.5000 mL | INTRAMUSCULAR | Status: AC
  Administered 2012-03-25: 0.5 mL via INTRAMUSCULAR
  Filled 2012-03-24: qty 0.5

## 2012-03-24 MED ORDER — PREDNISONE 50 MG PO TABS
60.0000 mg | ORAL_TABLET | Freq: Two times a day (BID) | ORAL | Status: DC
  Filled 2012-03-24: qty 1

## 2012-03-24 MED ORDER — PNEUMOCOCCAL VAC POLYVALENT 25 MCG/0.5ML IJ INJ
0.5000 mL | INJECTION | INTRAMUSCULAR | Status: AC
  Administered 2012-03-25: 0.5 mL via INTRAMUSCULAR
  Filled 2012-03-24: qty 0.5

## 2012-03-24 MED ORDER — PREDNISONE 50 MG PO TABS
60.0000 mg | ORAL_TABLET | Freq: Every day | ORAL | Status: DC
  Administered 2012-03-25 – 2012-03-26 (×2): 60 mg via ORAL
  Filled 2012-03-24 (×3): qty 1

## 2012-03-24 NOTE — Progress Notes (Signed)
Pt profile: Admitted by [facility] with asthma exac due to medical noncompliance   Subj: Feels better but not ready for discharge yet   Obj: Filed Vitals:   03/24/12 1712  BP: 133/78  Pulse: 111  Temp: 98.6 F (37 C)  Resp: 20    Gen: NAD HEENT: WNL Neck: No JVD Chest: diffuse scattered wheezes Cardiac: RRR s M Abd: soft, NT, NABS Ext: warm s edema  BMET    Component Value Date/Time   NA 137 03/24/2012 0520   K 4.3 03/24/2012 0520   CL 104 03/24/2012 0520   CO2 24 03/24/2012 0520   GLUCOSE 169* 03/24/2012 0520   BUN 13 03/24/2012 0520   CREATININE 0.61 03/24/2012 0520   CALCIUM 9.9 03/24/2012 0520   GFRNONAA >90 03/24/2012 0520   GFRAA >90 03/24/2012 0520    CBC    Component Value Date/Time   WBC 24.4* 03/24/2012 0520   RBC 4.03 03/24/2012 0520   HGB 12.7 03/24/2012 0520   HCT 37.3 03/24/2012 0520   PLT 372 03/24/2012 0520   MCV 92.6 03/24/2012 0520   MCH 31.5 03/24/2012 0520   MCHC 34.0 03/24/2012 0520   RDW 12.8 03/24/2012 0520   LYMPHSABS 2.9 03/23/2012 0050   MONOABS 1.0 03/23/2012 0050   EOSABS 1.6* 03/23/2012 0050   BASOSABS 0.1 03/23/2012 0050    CXR: no new film   IMPRESSION: Asthma exacerbation due to medical noncompliance Improving  PLAN/RECS:  She can be discharged to home from a medical point of view when she is able to sufficiently perform ADLs.  -Complete 10 day taper of prednisone (60 mgs per day to off) -Resume Advair -Cont PRN albuterol   Her biggest problem with asthma control is lack of understanding of importance of control over rescue therapy and inability to afford her control medications. I discussed in detail with her these matters. I emphasized that there are mechanisms to obtain these meds even if she is unable to afford them. These issues will have to be reinforced frequently over time, I suspect.  I have scheduled follow up with Tammy Parrett in [facility] on October 8th @ 10:30 PM. She should re-establish care with [facility] and be  followed regularly. I assured her that we would make all efforts to keep her medical regimen affordable for her.    PCCM will sign off. Please call if we can be of further assistance  Billy Fischer, MD ; [facility] [phone].  After 5:30 PM or weekends, call [phone]

## 2012-03-24 NOTE — Progress Notes (Signed)
Pt TX to [id number], VSS, called report. 

## 2012-03-24 NOTE — Progress Notes (Signed)
[facility] Progress Note [place] TEAM 1 - Stepdown/ICU TEAM   AISLEY STANPHILL ZOX:[id number] DOB: 01/12/1976 DOA: 03/23/2012 PCP: Provider Not In System  Brief narrative: 36 year old patient with long-standing history of asthma. Apparently she ran out of her Advair because she was using more than usual because of increased frequency of nocturnal exacerbations. 24 hours prior to presentation shortness of breath became extreme and she presented to the ER where she was unable to get her symptoms relieved despite continuous nebulizers and Solu-Medrol. Because of the degree of her asthma exacerbation she was admitted to the step down unit for further monitoring and treatment. Because of concerns of possible need for intubation pulmonary medicine was consulted at time of admission.  Assessment/Plan:  Acute respiratory failure due to Asthma exacerbation *Markedly improved *tolerating room air without rest dyspnea *begin steroid taper and plan to initiate prednisone 03/25/2012 * still with wheezing at rest and marked dyspnea w/ exertion, so not yet ready for d/c  * appreciate pulmonary medicine and we'll continue with the recommendations for nebulizers as well as Advair  Tachycardia *multifactorial related to anxiety as well as albuterol  Leukocytosis *due to the of utilization of steroids  Anxiety and depression *Patient endorsed to nursing staff significant issues in her personal life prior to admission but would not elaborate. *patient refused Child psychotherapist and chaplain consultation but did request for consultation from psychiatry which we have done  DVT prophylaxis: Lovenox Code Status: full Family Communication: spoke directly with the patient Disposition Plan: transfer to floor  Consultants: Pulmonary medicine  Procedures: none  Antibiotics: none  HPI/Subjective: Patient states that although breathing is better she still feels like she is having some trouble  breathing especially with ambulation. Also endorses anorexia and anxiety.   Objective: Blood pressure 129/63, pulse 107, temperature 98.7 F (37.1 C), temperature source Oral, resp. rate 25, height 5\' 8"  (1.727 m), weight 86.1 kg (189 lb 13.1 oz), last menstrual period 03/23/2012, SpO2 97.00%.  Intake/Output Summary (Last 24 hours) at 03/24/12 1533 Last data filed at 03/24/12 0824  Gross per 24 hour  Intake    483 ml  Output    250 ml  Net    233 ml     Exam: General: No acute respiratory distress at rest  Lungs: continues with bilateral expiratory wheezes but in no acute distress at rest, room air Cardiovascular: Regular rate and rhythm without murmur gallop or rub normal S1 and S2 Abdomen: Nontender, nondistended, soft, bowel sounds positive, no rebound, no ascites, no appreciable mass Extremities: No significant cyanosis, clubbing, or edema bilateral lower extremities  Data Reviewed: Basic Metabolic Panel:  Lab 03/24/12 8119 03/23/12 0635 03/23/12 0050  NA 137 138 142  K 4.3 4.1 4.0  CL 104 103 103  CO2 24 21 26   GLUCOSE 169* 187* 86  BUN 13 10 12   CREATININE 0.61 0.60 0.70  CALCIUM 9.9 9.8 9.7  MG -- 1.9 --  PHOS -- 2.4 --   Liver Function Tests:  Lab 03/24/12 0520  AST 15  ALT 11  ALKPHOS 49  BILITOT 0.2*  PROT 7.8  ALBUMIN 3.8   CBC:  Lab 03/24/12 0520 03/23/12 0050  WBC 24.4* 15.9*  NEUTROABS -- 10.3*  HGB 12.7 12.8  HCT 37.3 36.8  MCV 92.6 90.9  PLT 372 350    Recent Results (from the past 240 hour(s))  MRSA PCR SCREENING     Status: Normal   Collection Time   03/23/12  5:21 AM  Component Value Range Status Comment   MRSA by PCR NEGATIVE  NEGATIVE Final      Studies:  Recent x-ray studies have been reviewed in detail by the Attending Physician  Scheduled Meds:  Reviewed in detail by the Attending Physician   Junious Silk, ANP [facility] Office  [phone] Pager [phone]  On-Call/Text Page:      [email or web address]       password [email or web address]  If 7PM-7AM, please contact night-coverage [email or web address] Password [email or web address] 03/24/2012, 3:33 PM   LOS: 1 day   I have personally examined this patient and reviewed the entire database. I have reviewed the above note, made any necessary editorial changes, and agree with its content.  Lonia Blood, MD [facility]

## 2012-03-24 NOTE — Progress Notes (Signed)
Clinical Social Work Department BRIEF PSYCHOSOCIAL ASSESSMENT 03/24/2012  Patient:  Crystal Sampson, Crystal Sampson     Account Number:  [id number]     Admit date:  03/23/2012  Clinical Social Worker:  Dennison Bulla  Date/Time:  03/24/2012 03:30 PM  Referred by:  RN  Date Referred:  03/24/2012 Referred for  Psychosocial assessment   Other Referral:   Interview type:  Patient Other interview type:    PSYCHOSOCIAL DATA Living Status:  FAMILY Admitted from facility:   Level of care:   Primary support name:  Crystal Sampson Primary support relationship to patient:  PARENT Degree of support available:   Adequate    CURRENT CONCERNS Current Concerns  Behavioral Health Issues   Other Concerns:    SOCIAL WORK ASSESSMENT / PLAN CSW received referral from RN during progression due to patient feeling overwhelmed and reporting to RN that she felt she was going to have a nervous breakdown. CSW reviewed chart and met with patient at bedside. No visitors were present.    CSW introduced myself and explained role. Patient reports she was admitted after having a panic attack vs asthma attack. Patient has 2 children ages 40 and 59 and works 2 jobs. Patient recently filed for bankruptcy and has been feeling overwhelmed with process. Patient was diagnosed with depression several years ago (at least 10). Patient used to go to [facility] the [place] but switched to [facility] after therapist changed jobs. Patient was taking Lexapro but stopped taking all medications. Patient reports that she feels medication made her like a zombie and she no longer wanted to take medication. Patient also stopped taking asthma medication since she could not afford it. CSW spoke with patient regarding talking with [facility] about other medication options. Patient also reports that she feels her therapist does not always take her seriously. Patient agreeable to reflecting on relationship with therapist to determine if it would be more  beneficial to change providers. Patient has a scheduled appointment tomorrow and agreed to call and reschedule.    Patient reports no support system but later discusses how parents, aunts, and friends are watching children. A friend stopped at one point during assessment to bring patient mail. Patient reports no SI or HI and states that she will speak with therapist regarding developing positive coping skills to assist with avoiding future panic attacks.    Patient agreeable to continue follow up with mental health provider in community. CSW is signing off but available if needed.   Assessment/plan status:  No Further Intervention Required Other assessment/ plan:   Information/referral to community resources:   To continue follow up with [facility]    PATIENT'S/FAMILY'S RESPONSE TO PLAN OF CARE: Patient is alert and oriented. Patient engaged throughout assessment. Patient contradicts herself several times throughout the assessment in regards to relationship with therapist and other friends and family. Patient has been compliant with mental health appointments every 2 weeks. Patient is not interested in medication management at this time but agreeable to speak with therapist if she changes her mind. Patient has support and MH follow up in the community. Patient appreciative of CSW and reports it is nice to have "someone to talk to."

## 2012-03-25 DIAGNOSIS — D72829 Elevated white blood cell count, unspecified: Secondary | ICD-10-CM

## 2012-03-25 NOTE — Care Management Note (Addendum)
    Page 1 of 1   03/26/2012     6:00:44 PM   CARE MANAGEMENT NOTE 03/26/2012  Patient:  Crystal Sampson, Crystal Sampson   Account Number:  [id number]  Date Initiated:  03/24/2012  Documentation initiated by:  Donn Pierini  Subjective/Objective Assessment:   Pt admitted with asthma     Action/Plan:   PTA pt lived at home, independent   Anticipated DC Date:  03/26/2012   Anticipated DC Plan:  HOME/SELF CARE      DC Planning Services  CM consult  Medication Assistance      Choice offered to / List presented to:             Status of service:  Completed, signed off Medicare Important Message given?   (If response is "NO", the following Medicare IM given date fields will be blank) Date Medicare IM given:   Date Additional Medicare IM given:    Discharge Disposition:  HOME/SELF CARE  Per UR Regulation:  Reviewed for med. necessity/level of care/duration of stay  If discussed at Long Length of Stay Meetings, dates discussed:    Comments:  03/26/12 12:04 Letha Cape RN, BSN [phone] patient is for dc today, she states she has transportation, she states she needs med ast for which she is eligible.  I tubed patient scripts down for med ast, Main pharmacy will contact [phone] when meds are ready.  Financial counselor spoke with patient yesterday and they will be in contact with her to set up appt this month to apply for medicaid.  03/24/12- 1200- Donn Pierini RN, BSN [phone] Spoke with pt at bedside- per conversation pt states that she use to see Dr. Jayme Cloud with pulm but it has been years ago, and she has not been able to always afford her Advair. She does work at the [employer]- but does not currently have any insurance. Info given to pt regarding clinics for non insured within [facility], Dr. Sung Amabile also to make f/u appointment for pt with their group. Info also given to pt about Advair coupon along with website to print coupon. Per main pharmacy pt is eligible for assistance for  medications through the indigent fund if needed.

## 2012-03-25 NOTE — Progress Notes (Signed)
Room air patient's O2 sat 94. Ambulated in hall on room air patient's O2 sat 94-100.  Kimball Manske Consuella Lose 03/25/2012  12:39 PM

## 2012-03-25 NOTE — Progress Notes (Signed)
PATIENT DETAILS Name: Crystal Sampson Age: 36 y.o. Sex: female Date of Birth: 1975/08/25 Admit Date: 03/23/2012 Admitting Physician Therisa Doyne, MD [id number] Not In System  Subjective: Feels better-but has not ambulated today  Assessment/Plan: Principal Problem:  *Acute respiratory failure -resolved -2/2 acute B Asthma Exac  Active Problems:  Asthma exacerbation -better -still with some wheezing today -c/w with current nebs -outpatient pulmonary follow up   Anxiety and depression -await psych consult   Tachycardia -multifactorial-2/2 anxiety/albuterol -better today   Leukocytosis -likely 2/2 to steroids -monitor periodically -no other foci of infection   Noncompliance -have counseled extensively  Disposition: Remain inpatient  DVT Prophylaxis: Prophylactic Lovenox   Code Status: Full code   Procedures:  None  CONSULTS:  pulmonary/intensive care  PHYSICAL EXAM: Vital signs in last 24 hours: Filed Vitals:   03/24/12 2044 03/24/12 2145 03/25/12 0227 03/25/12 0453  BP:  144/75  124/75  Pulse:  108  86  Temp:  98.8 F (37.1 C)  98.4 F (36.9 C)  TempSrc:  Oral  Oral  Resp:  20  17  Height:      Weight:      SpO2: 97% 99% 97% 93%    Weight change:  Body mass index is 28.83 kg/(m^2).   Gen Exam: Awake and alert with clear speech.   Neck: Supple, No JVD.   Chest: B/L good air entry-still with b/l rhonchi in almost all lung zones CVS: S1 S2 Regular, no murmurs.  Abdomen: soft, BS +, non tender, non distended.  Extremities: no edema, lower extremities warm to touch. Neurologic: Non Focal.   Skin: No Rash.   Wounds: N/A.    Intake/Output from previous day:  Intake/Output Summary (Last 24 hours) at 03/25/12 1351 Last data filed at 03/24/12 2100  Gross per 24 hour  Intake    360 ml  Output      0 ml  Net    360 ml     LAB RESULTS: CBC  Lab 03/24/12 0520 03/23/12 0050  WBC 24.4* 15.9*  HGB 12.7 12.8  HCT 37.3 36.8  PLT  372 350  MCV 92.6 90.9  MCH 31.5 31.6  MCHC 34.0 34.8  RDW 12.8 11.8  LYMPHSABS -- 2.9  MONOABS -- 1.0  EOSABS -- 1.6*  BASOSABS -- 0.1  BANDABS -- --    Chemistries   Lab 03/24/12 0520 03/23/12 0635 03/23/12 0050  NA 137 138 142  K 4.3 4.1 4.0  CL 104 103 103  CO2 24 21 26   GLUCOSE 169* 187* 86  BUN 13 10 12   CREATININE 0.61 0.60 0.70  CALCIUM 9.9 9.8 9.7  MG -- 1.9 --    CBG: No results found for this basename: GLUCAP:5 in the last 168 hours  GFR Estimated Creatinine Clearance: 112.6 ml/min (by C-G formula based on Cr of 0.61).  Coagulation profile No results found for this basename: INR:5,PROTIME:5 in the last 168 hours  Cardiac Enzymes No results found for this basename: CK:3,CKMB:3,TROPONINI:3,MYOGLOBIN:3 in the last 168 hours  No components found with this basename: POCBNP:3 No results found for this basename: DDIMER:2 in the last 72 hours No results found for this basename: HGBA1C:2 in the last 72 hours No results found for this basename: CHOL:2,HDL:2,LDLCALC:2,TRIG:2,CHOLHDL:2,LDLDIRECT:2 in the last 72 hours  Basename 03/24/12 0520  TSH 0.337*  T4TOTAL --  T3FREE --  THYROIDAB --   No results found for this basename: VITAMINB12:2,FOLATE:2,FERRITIN:2,TIBC:2,IRON:2,RETICCTPCT:2 in the last 72 hours No results found for this basename: LIPASE:2,AMYLASE:2 in  the last 72 hours  Urine Studies No results found for this basename: UACOL:2,UAPR:2,USPG:2,UPH:2,UTP:2,UGL:2,UKET:2,UBIL:2,UHGB:2,UNIT:2,UROB:2,ULEU:2,UEPI:2,UWBC:2,URBC:2,UBAC:2,CAST:2,CRYS:2,UCOM:2,BILUA:2 in the last 72 hours  MICROBIOLOGY: Recent Results (from the past 240 hour(s))  MRSA PCR SCREENING     Status: Normal   Collection Time   03/23/12  5:21 AM      Component Value Range Status Comment   MRSA by PCR NEGATIVE  NEGATIVE Final     RADIOLOGY STUDIES/RESULTS: Dg Chest Portable 1 View  03/23/2012  *RADIOLOGY REPORT*  Clinical Data: Shortness of breath.  PORTABLE CHEST - 1 VIEW   Comparison: 11/05/2010  Findings: Single view of the chest was obtained.  There is no focal airspace disease or edema.  Heart and mediastinum are within normal limits and stable.  The trachea is midline.  Bony structures are intact.  There may be mild peribronchial thickening in the lower lungs without focal disease.  IMPRESSION: No acute chest findings.   Original Report Authenticated By: Richarda Overlie, M.D.     MEDICATIONS: Scheduled Meds:   . albuterol  2.5 mg Nebulization Q6H  . docusate sodium  100 mg Oral BID  . enoxaparin (LOVENOX) injection  40 mg Subcutaneous Q24H  . Fluticasone-Salmeterol  1 puff Inhalation Q12H  . influenza  inactive virus vaccine  0.5 mL Intramuscular Tomorrow-1000  . methylPREDNISolone (SOLU-MEDROL) injection  60 mg Intravenous Q6H  . pantoprazole  40 mg Oral Q1200  . pneumococcal 23 valent vaccine  0.5 mL Intramuscular Tomorrow-1000  . predniSONE  60 mg Oral Q breakfast  . DISCONTD: albuterol  2.5 mg Nebulization Q4H  . DISCONTD: guaiFENesin  600 mg Oral BID  . DISCONTD: predniSONE  60 mg Oral BID WC  . DISCONTD: sodium chloride  3 mL Intravenous Q12H   Continuous Infusions:  PRN Meds:.acetaminophen, acetaminophen, albuterol, benzonatate, guaiFENesin-dextromethorphan, HYDROcodone-acetaminophen, ondansetron (ZOFRAN) IV, ondansetron, DISCONTD: sodium chloride, DISCONTD: levalbuterol, DISCONTD: sodium chloride  Antibiotics: Anti-infectives    None       Jeoffrey Massed, MD  [facility] Pager:[phone]  If 7PM-7AM, please contact night-coverage [email or web address] Password [email or web address] 03/25/2012, 1:51 PM   LOS: 2 days

## 2012-03-26 MED ORDER — PREDNISONE 20 MG PO TABS: 40.0000 mg | ORAL_TABLET | Freq: Every day | ORAL | Status: DC

## 2012-03-26 MED ORDER — ALBUTEROL SULFATE (5 MG/ML) 0.5% IN NEBU: 2.5000 mg | INHALATION_SOLUTION | RESPIRATORY_TRACT | Status: DC | PRN

## 2012-03-26 MED ORDER — FLUTICASONE-SALMETEROL 250-50 MCG/DOSE IN AEPB: 1.0000 | INHALATION_SPRAY | Freq: Two times a day (BID) | RESPIRATORY_TRACT | Status: DC

## 2012-03-26 MED ORDER — BECLOMETHASONE DIPROPIONATE 80 MCG/ACT IN AERS: 2.0000 | INHALATION_SPRAY | Freq: Two times a day (BID) | RESPIRATORY_TRACT | Status: DC

## 2012-03-26 MED ORDER — BENZONATATE 100 MG PO CAPS: 100.0000 mg | ORAL_CAPSULE | Freq: Three times a day (TID) | ORAL | Status: DC | PRN

## 2012-03-26 NOTE — Discharge Summary (Signed)
Physician Discharge Summary  Crystal Sampson:[id number] DOB: [birth year] DOA: 03/23/2012  PCP: Provider Not In System Pulmonology:  Tammy S. Parrett, NP  Admit date: 03/23/2012 Discharge date: 03/26/2012  Recommendations for Outpatient Follow-up:  Follow regularly with [facility]mon[facility].   Take breathing medications daily to prevent asthma attack.  Discharge Diagnoses:  Principal Problem:  *Acute respiratory failure Active Problems:  Asthma exacerbation  Anxiety and depression  Tachycardia  Leukocytosis  Noncompliance    Discharge Condition: much improved.  Walking the hallways with no shortness of breath.  Diet recommendation: regular diet  History of present illness:   36 yo female with history of asthma and depression - ran out of advair some time ago and was unable to afford the refills.  She developed shortness of breath and increased nocturnal symptoms.    Hospital Course:  She presented to the ED in moderate respiratory distress with tachypnea requiring 4 liters of oxygen nasal canula to maintain oxygen sats of 96%.  She was admitted to the step down unit for close monitoring.  Pulmonary Critical Care was consulted as the patient appears that she may need intubation. She was treated with IV solumedrol, nebulizers, and inhalers.  Over the course of several days she improved significantly.  She has been transitioned to oral prednisone and is now stable for discharge.  Dr. Maple Hudson scheduled an appointment for her to be seen and establish care with [facility].  With regards to her depression, she had a counseling session with psychiatric social work and reported feeling somewhat better.  She did not like antidepressants and they made her feel poorly.  She has been seen by [facility] and will return there for follow up.    Consultations:  Coralee North, Dr. Jetty Duhamel  Psychiatric Social Work  Discharge Exam: Filed Vitals:   03/26/12  0600  BP: 133/81  Pulse: 83  Temp: 98.8 F (37.1 C)  Resp: 18   Filed Vitals:   03/25/12 1501 03/25/12 2056 03/26/12 0600 03/26/12 0841  BP: 123/79 138/85 133/81   Pulse: 98 92 83   Temp: 98.9 F (37.2 C) 97.6 F (36.4 C) 98.8 F (37.1 C)   TempSrc: Oral Oral Oral   Resp: 15 16 18    Height:      Weight:      SpO2: 98% 98% 95% 96%   General: Alert and orientated, NAD Cardiovascular: RRR, No M/R/G Respiratory: Minimally course breath sounds.  Able to walk with no SOB. Extremities:  No Lower extremity edema Psychiatric:  A&O X 4, Cooperative, Well Groomed.  Discharge Instructions      Discharge Orders    Future Appointments: Provider: Department: Dept Phone: Center:   03/31/2012 10:30 AM Julio Sicks, NP [facility] [phone] None     Future Orders Please Complete By Expires   Increase activity slowly          Medication List     As of 03/26/2012  3:49 PM    STOP taking these medications         albuterol (2.5 MG/3ML) 0.083% nebulizer solution   Commonly known as: PROVENTIL      TAKE these medications         albuterol (5 MG/ML) 0.5% nebulizer solution   Commonly known as: PROVENTIL   Take 0.5 mLs (2.5 mg total) by nebulization every 2 (two) hours as needed for wheezing or shortness of breath.      beclomethasone 80 MCG/ACT inhaler   Commonly  known as: QVAR   Inhale 2 puffs into the lungs 2 (two) times daily.      benzonatate 100 MG capsule   Commonly known as: TESSALON   Take 1 capsule (100 mg total) by mouth 3 (three) times daily as needed for cough.      Fluticasone-Salmeterol 250-50 MCG/DOSE Aepb   Commonly known as: ADVAIR   Inhale 1 puff into the lungs every 12 (twelve) hours.      predniSONE 20 MG tablet   Commonly known as: DELTASONE   Take 2 tablets (40 mg total) by mouth daily with breakfast. On 10/3 take 40 mg (2 tabs) with breakfast.  On 10/4 take 30 mg (1 1/2 tabs)  On 10/5 take 20 mg (1 tab)  On 10/6 take 10 mg (1/2  tab)  and then stop.         Follow-up Information    Follow up with PARRETT,TAMMY, NP. On 04/01/2012. (Appointment at 10:30 am with [facility])    Contact information:   [facility] [address] [place] [address] [phone]       Follow up with [facility]. (Walk in appointment after discharge.)    Contact information:   Address: [address] [place], [place] [address]   [phone]           The results of significant diagnostics from this hospitalization (including imaging, microbiology, ancillary and laboratory) are listed below for reference.    Significant Diagnostic Studies: Dg Chest Portable 1 View  03/23/2012  *RADIOLOGY REPORT*  Clinical Data: Shortness of breath.  PORTABLE CHEST - 1 VIEW  Comparison: 11/05/2010  Findings: Single view of the chest was obtained.  There is no focal airspace disease or edema.  Heart and mediastinum are within normal limits and stable.  The trachea is midline.  Bony structures are intact.  There may be mild peribronchial thickening in the lower lungs without focal disease.  IMPRESSION: No acute chest findings.   Original Report Authenticated By: Richarda Overlie, M.D.     Microbiology: Recent Results (from the past 240 hour(s))  MRSA PCR SCREENING     Status: Normal   Collection Time   03/23/12  5:21 AM      Component Value Range Status Comment   MRSA by PCR NEGATIVE  NEGATIVE Final      Labs: Basic Metabolic Panel:  Lab 03/24/12 0865 03/23/12 0635 03/23/12 0050  NA 137 138 142  K 4.3 4.1 4.0  CL 104 103 103  CO2 24 21 26   GLUCOSE 169* 187* 86  BUN 13 10 12   CREATININE 0.61 0.60 0.70  CALCIUM 9.9 9.8 9.7  MG -- 1.9 --  PHOS -- 2.4 --   Liver Function Tests:  Lab 03/24/12 0520  AST 15  ALT 11  ALKPHOS 49  BILITOT 0.2*  PROT 7.8  ALBUMIN 3.8   CBC:  Lab 03/24/12 0520 03/23/12 0050  WBC 24.4* 15.9*  NEUTROABS -- 10.3*  HGB 12.7 12.8  HCT 37.3 36.8  MCV  92.6 90.9  PLT 372 350    Time coordinating discharge: 25 min.  Signed:  Algis Downs, PA-C [facility] Pager: [phone]  [facility] 03/26/2012, 3:49 PM  . Addendum  Please see above note dictated by Algis Downs, PA-C.  Acute asthma exacerbation, acute respiratory failure.  Followup with [facility] pulmonology as outpatient.  Renato Spellman [facility] 03/26/2012, 3:52 PM

## 2012-03-26 NOTE — Progress Notes (Signed)
Physician Discharge Summary  JAKI STEPTOE ZOX:[id number] DOB: 12-07-1975 DOA: 03/23/2012  PCP: Provider Not In System Pulmonology:  Tammy S. Parrett, NP  Admit date: 03/23/2012 Discharge date: 03/26/2012  Recommendations for Outpatient Follow-up:  Follow regularly with [facility]mon[facility].   Take breathing medications daily to prevent asthma attack.  Discharge Diagnoses:  Principal Problem:  *Acute respiratory failure Active Problems:  Asthma exacerbation  Anxiety and depression  Tachycardia  Leukocytosis  Noncompliance    Discharge Condition: much improved.  Walking the hallways with no shortness of breath.  Diet recommendation: regular diet  History of present illness:   36 yo female with history of asthma and depression - ran out of advair some time ago and was unable to afford the refills.  She developed shortness of breath and increased nocturnal symptoms.    Hospital Course:  She presented to the ED in moderate respiratory distress with tachypnea requiring 4 liters of oxygen nasal canula to maintain oxygen sats of 96%.  She was admitted to the step down unit for close monitoring.  Pulmonary Critical Care was consulted as the patient appears that she may need intubation. She was treated with IV solumedrol, nebulizers, and inhalers.  Over the course of several days she improved significantly.  She has been transitioned to oral prednisone and is now stable for discharge.  Dr. Maple Hudson scheduled an appointment for her to be seen and establish care with [facility].  With regards to her depression, she had a counseling session with psychiatric social work and reported feeling somewhat better.  She did not like antidepressants and they made her feel poorly.  She has been seen by [facility] and will return there for follow up.    Consultations:  Coralee North, Dr. Jetty Duhamel  Psychiatric Social Work  Discharge Exam: Filed Vitals:   03/26/12  0600  BP: 133/81  Pulse: 83  Temp: 98.8 F (37.1 C)  Resp: 18   Filed Vitals:   03/25/12 1501 03/25/12 2056 03/26/12 0600 03/26/12 0841  BP: 123/79 138/85 133/81   Pulse: 98 92 83   Temp: 98.9 F (37.2 C) 97.6 F (36.4 C) 98.8 F (37.1 C)   TempSrc: Oral Oral Oral   Resp: 15 16 18    Height:      Weight:      SpO2: 98% 98% 95% 96%   General: Alert and orientated, NAD Cardiovascular: RRR, No M/R/G Respiratory: Minimally course breath sounds.  Able to walk with no SOB. Extremities:  No Lower extremity edema Psychiatric:  A&O X 4, Cooperative, Well Groomed.  Discharge Instructions      Discharge Orders    Future Appointments: Provider: Department: Dept Phone: Center:   03/31/2012 10:30 AM Julio Sicks, NP [facility] [phone] None     Future Orders Please Complete By Expires   Increase activity slowly          Medication List     As of 03/26/2012 11:51 AM    STOP taking these medications         albuterol (2.5 MG/3ML) 0.083% nebulizer solution   Commonly known as: PROVENTIL      TAKE these medications         albuterol (5 MG/ML) 0.5% nebulizer solution   Commonly known as: PROVENTIL   Take 0.5 mLs (2.5 mg total) by nebulization every 2 (two) hours as needed for wheezing or shortness of breath.      beclomethasone 80 MCG/ACT inhaler   Commonly known  as: QVAR   Inhale 2 puffs into the lungs 2 (two) times daily.      benzonatate 100 MG capsule   Commonly known as: TESSALON   Take 1 capsule (100 mg total) by mouth 3 (three) times daily as needed for cough.      Fluticasone-Salmeterol 250-50 MCG/DOSE Aepb   Commonly known as: ADVAIR   Inhale 1 puff into the lungs every 12 (twelve) hours.      predniSONE 20 MG tablet   Commonly known as: DELTASONE   Take 2 tablets (40 mg total) by mouth daily with breakfast. On 10/3 take 40 mg (2 tabs) with breakfast.  On 10/4 take 30 mg (1 1/2 tabs)  On 10/5 take 20 mg (1 tab)  On 10/6 take 10 mg (1/2  tab)  and then stop.         Follow-up Information    Follow up with PARRETT,TAMMY, NP. On 04/01/2012. (Appointment at 10:30 am with [facility])    Contact information:   [facility] [address] [place] [address] [phone]       Follow up with [facility]. (Walk in appointment after discharge.)    Contact information:   Address: [address] [place], [place] [address]   [phone]           The results of significant diagnostics from this hospitalization (including imaging, microbiology, ancillary and laboratory) are listed below for reference.    Significant Diagnostic Studies: Dg Chest Portable 1 View  03/23/2012  *RADIOLOGY REPORT*  Clinical Data: Shortness of breath.  PORTABLE CHEST - 1 VIEW  Comparison: 11/05/2010  Findings: Single view of the chest was obtained.  There is no focal airspace disease or edema.  Heart and mediastinum are within normal limits and stable.  The trachea is midline.  Bony structures are intact.  There may be mild peribronchial thickening in the lower lungs without focal disease.  IMPRESSION: No acute chest findings.   Original Report Authenticated By: Richarda Overlie, M.D.     Microbiology: Recent Results (from the past 240 hour(s))  MRSA PCR SCREENING     Status: Normal   Collection Time   03/23/12  5:21 AM      Component Value Range Status Comment   MRSA by PCR NEGATIVE  NEGATIVE Final      Labs: Basic Metabolic Panel:  Lab 03/24/12 7846 03/23/12 0635 03/23/12 0050  NA 137 138 142  K 4.3 4.1 4.0  CL 104 103 103  CO2 24 21 26   GLUCOSE 169* 187* 86  BUN 13 10 12   CREATININE 0.61 0.60 0.70  CALCIUM 9.9 9.8 9.7  MG -- 1.9 --  PHOS -- 2.4 --   Liver Function Tests:  Lab 03/24/12 0520  AST 15  ALT 11  ALKPHOS 49  BILITOT 0.2*  PROT 7.8  ALBUMIN 3.8   CBC:  Lab 03/24/12 0520 03/23/12 0050  WBC 24.4* 15.9*  NEUTROABS -- 10.3*  HGB 12.7 12.8  HCT 37.3 36.8  MCV  92.6 90.9  PLT 372 350    Time coordinating discharge: 25 min.  Signed:  Algis Downs, PA-C [facility] Pager: [phone]  [facility] 03/26/2012, 11:51 AM

## 2012-03-26 NOTE — Progress Notes (Signed)
Nsg Discharge Note  Admit Date:  03/23/2012 Discharge date: 03/26/2012   Waynetta Pean to be D/C'd Home per MD order.  AVS completed.  Copy for chart, and copy for patient signed, and dated. Patient/caregiver able to verbalize understanding.  Discharge Medication:  Ezekiel, Couey  Home Medication Instructions ZOX:[id number]   Printed on:03/26/12 1553  Medication Information                    beclomethasone (QVAR) 80 MCG/ACT inhaler Inhale 2 puffs into the lungs 2 (two) times daily.           benzonatate (TESSALON) 100 MG capsule Take 1 capsule (100 mg total) by mouth 3 (three) times daily as needed for cough.           Fluticasone-Salmeterol (ADVAIR) 250-50 MCG/DOSE AEPB Inhale 1 puff into the lungs every 12 (twelve) hours.           predniSONE (DELTASONE) 20 MG tablet Take 2 tablets (40 mg total) by mouth daily with breakfast. On 10/3 take 40 mg (2 tabs) with breakfast. On 10/4 take 30 mg (1 1/2 tabs) On 10/5 take 20 mg (1 tab) On 10/6 take 10 mg (1/2 tab) and then stop.           albuterol (PROVENTIL) (5 MG/ML) 0.5% nebulizer solution Take 0.5 mLs (2.5 mg total) by nebulization every 2 (two) hours as needed for wheezing or shortness of breath.             Discharge Assessment: Filed Vitals:   03/26/12 0600  BP: 133/81  Pulse: 83  Temp: 98.8 F (37.1 C)  Resp: 18   Skin clean, dry and intact without evidence of skin break down, no evidence of skin tears noted. IV catheter discontinued intact. Site without signs and symptoms of complications - no redness or edema noted at insertion site, patient denies c/o pain - only slight tenderness at site.  Dressing with slight pressure applied.  D/c Instructions-Education: Discharge instructions given to patient/family with verbalized understanding. D/c education completed with patient/family including follow up instructions, medication list, d/c activities limitations if indicated, with other d/c instructions as indicated by  MD - patient able to verbalize understanding, all questions fully answered. Patient instructed to return to ED, call 911, or call MD for any changes in condition.  Patient escorted via WC, and D/C home via private auto.  Oliva Montecalvo Consuella Lose, RN 03/26/2012 3:53 PM

## 2012-03-31 ENCOUNTER — Inpatient Hospital Stay: Payer: Self-pay | Admitting: Adult Health

## 2012-04-03 ENCOUNTER — Ambulatory Visit (INDEPENDENT_AMBULATORY_CARE_PROVIDER_SITE_OTHER): Payer: Self-pay | Admitting: Adult Health

## 2012-04-03 ENCOUNTER — Encounter: Payer: Self-pay | Admitting: Adult Health

## 2012-04-03 VITALS — BP 118/64 | HR 96 | Temp 97.0°F | Ht 68.0 in | Wt 200.4 lb

## 2012-04-03 DIAGNOSIS — J45901 Unspecified asthma with (acute) exacerbation: Secondary | ICD-10-CM

## 2012-04-03 NOTE — Patient Instructions (Addendum)
Continue on Advair 1 puff Twice daily   We are going to get paperwork for medication assistance paperwork.  Follow up with Dr. Shelle Iron or Dr. Craige Cotta in 3 weeks and As needed

## 2012-04-08 NOTE — Progress Notes (Signed)
  Subjective:    Patient ID: Crystal Sampson, female    DOB: [birth year], 36 y.o.   MRN: [id number]  HPI 36 yo female with known hx of Asthma   04/03/12 Post Hospital  Admitted 9/29-10/2/13 for Asthma exacerbation w/ acute resp failure after running out of Advair.  Patient presented to the emergency room in respiratory distress, requiring 4 L of oxygen. Initially. She was treated with aggressive IV steroids, and, nebulizers Patient was restarted on her Advair and slowly improved with the need for oxygen. Prior to discharge She denies any hemoptysis, orthopnea, PND, or leg swelling. Since discharge. Patient states she is feeling, better. She's taking her Advair twice daily. She was discharged on a prednisone taper. She is now finished Still gets weak and wears out easily .  We went over patient assistance paperwork today to help with Advair.    Review of Systems Constitutional:   No  weight loss, night sweats,  Fevers, chills, fatigue, or  lassitude.  HEENT:   No headaches,  Difficulty swallowing,  Tooth/dental problems, or  Sore throat,                No sneezing, itching, ear ache, nasal congestion, post nasal drip,   CV:  No chest pain,  Orthopnea, PND, swelling in lower extremities, anasarca, dizziness, palpitations, syncope.   GI  No heartburn, indigestion, abdominal pain, nausea, vomiting, diarrhea, change in bowel habits, loss of appetite, bloody stools.   Resp:    No excess mucus, no productive cough,  No non-productive cough,  No coughing up of blood.  No change in color of mucus.  No wheezing.  No chest wall deformity  Skin: no rash or lesions.  GU: no dysuria, change in color of urine, no urgency or frequency.  No flank pain, no hematuria   MS:  No joint pain or swelling.  No decreased range of motion.  No back pain.  Psych:  No change in mood or affect.   No memory loss.         Objective:   Physical Exam GEN: A/Ox3; pleasant , NAD   HEENT:  [place]/AT,  EACs-clear,  TMs-wnl, NOSE-clear, THROAT-clear, no lesions, no postnasal drip or exudate noted.   NECK:  Supple w/ fair ROM; no JVD; normal carotid impulses w/o bruits; no thyromegaly or nodules palpated; no lymphadenopathy.  RESP  Coarse BS  w/o, wheezes/ rales/ or rhonchi.no accessory muscle use, no dullness to percussion  CARD:  RRR, no m/r/g  , no peripheral edema, pulses intact, no cyanosis or clubbing.  GI:   Soft & nt; nml bowel sounds; no organomegaly or masses detected.  Musco: Warm bil, no deformities or joint swelling noted.   Neuro: alert, no focal deficits noted.    Skin: Warm, no lesions or rashes         Assessment & Plan:

## 2012-04-08 NOTE — Assessment & Plan Note (Signed)
Recent exacerbation , now resolved  Med compliance is an issue- pt assistance paperwork given for Susa Simmonds  follow up Dr. Shelle Iron in 2-3 weeks and As needed

## 2012-04-29 ENCOUNTER — Telehealth: Payer: Self-pay | Admitting: Pulmonary Disease

## 2012-04-29 NOTE — Telephone Encounter (Signed)
Per Irving Burton, pt is in the lobby requesting samples.  She has filled out the patient assistance form for her advair but has not heard anything back and is unable to afford this medication.  Pt was last seen 10.10.13 by TP for post hosp and has appt with [facility] scheduled for 11.13.13.  Pt given 2 samples that last for 1 week each is advised that she MUST keep her upcoming ov w/ [facility].    Nothing further needed at this time; will sign off.

## 2012-05-07 ENCOUNTER — Encounter: Payer: Self-pay | Admitting: Pulmonary Disease

## 2012-05-07 ENCOUNTER — Ambulatory Visit (INDEPENDENT_AMBULATORY_CARE_PROVIDER_SITE_OTHER): Payer: Self-pay | Admitting: Pulmonary Disease

## 2012-05-07 VITALS — BP 110/70 | HR 97 | Temp 98.3°F | Ht 68.5 in | Wt 203.0 lb

## 2012-05-07 DIAGNOSIS — J45909 Unspecified asthma, uncomplicated: Secondary | ICD-10-CM

## 2012-05-07 DIAGNOSIS — J4540 Moderate persistent asthma, uncomplicated: Secondary | ICD-10-CM | POA: Insufficient documentation

## 2012-05-07 DIAGNOSIS — J4550 Severe persistent asthma, uncomplicated: Secondary | ICD-10-CM | POA: Insufficient documentation

## 2012-05-07 NOTE — Patient Instructions (Addendum)
Stop qvar, but stay on advair TWICE A DAY EVERYDAY. Can use albuterol for rescue, but call us if you are requiring more than 2 times a week. Will get your old chart and you may need referral to allergist if you wish to keep your dogs. Will check on the progress of your patient assistance paperwork. followup with me in 3 mos.

## 2012-05-07 NOTE — Assessment & Plan Note (Signed)
The patient has a history of asthma since childhood, and has had a recent acute exacerbation.  She is much improved now that she is back on maintenance therapy, and her spirometry today shows only mild airflow obstruction.  I have asked her to continue on the Advair, and we will check on the status of her patient assistance paperwork.  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  We may have to consider allergy referral for desensitization depending upon how she does.  I have had a long discussion with her about the pathophysiology of asthma, including the importance of inhaled corticosteroids as a daily maintenance medication.  I have also warned her about excessive rescue inhaler use, and she is to call me if she is using her rescue inhaler more than twice a week.

## 2012-05-07 NOTE — Progress Notes (Signed)
  Subjective:    Patient ID: Crystal Sampson, female    DOB: [birth year], 36 y.o.   MRN: [id number]  HPI The patient is a 36 year old female who I've been asked to see for management of asthma.  The patient has long-standing asthma since childhood, and has used primarily Primatene Mist in the past.  She has seen in the distant past a pulmonologist who started her on Advair with a good response.  However, she stopped the medication in 2012, and did well until September of this year when she had an acute asthma exacerbation that required hospitalization.  This was felt to be secondary to medical noncompliance, but the patient states that she does have an allergy to dog dander and has continued to keep dogs in her house.  She has no history of chronic sinus disease or recurrent sinus infections, but has noted lately a few more reflux symptoms.  Currently she is close to her usual baseline, and has been staying on the Advair.  She has filled out patient assistance paperwork so that she gets her medications consistently.   Review of Systems  Constitutional: Negative for fever and unexpected weight change.  HENT: Positive for congestion, rhinorrhea and sinus pressure. Negative for ear pain, nosebleeds, sore throat, sneezing, trouble swallowing, dental problem and postnasal drip.   Eyes: Negative for redness and itching.  Respiratory: Positive for shortness of breath and wheezing. Negative for cough and chest tightness.   Cardiovascular: Negative for palpitations and leg swelling.  Gastrointestinal: Negative for nausea and vomiting.  Genitourinary: Negative for dysuria.  Musculoskeletal: Negative for joint swelling.  Skin: Negative for rash.  Neurological: Positive for headaches.  Hematological: Does not bruise/bleed easily.  Psychiatric/Behavioral: Positive for dysphoric mood. The patient is not nervous/anxious.        Objective:   Physical Exam Constitutional:  Overweight female, no acute  distress  HENT:  Nares patent without discharge  Oropharynx without exudate, palate and uvula are normal  Eyes:  Perrla, eomi, no scleral icterus  Neck:  No JVD, no TMG  Cardiovascular:  Normal rate, regular rhythm, no rubs or gallops.  No murmurs        Intact distal pulses  Pulmonary :  Normal breath sounds, no stridor or respiratory distress   No rales, rhonchi, or wheezing  Abdominal:  Soft, nondistended, bowel sounds present.  No tenderness noted.   Musculoskeletal:  No lower extremity edema noted.  Lymph Nodes:  No cervical lymphadenopathy noted  Skin:  No cyanosis noted  Neurologic:  Alert, appropriate, moves all 4 extremities without obvious deficit.         Assessment & Plan:

## 2012-05-09 ENCOUNTER — Encounter: Payer: Self-pay | Admitting: Pulmonary Disease

## 2012-05-09 ENCOUNTER — Other Ambulatory Visit: Payer: Self-pay | Admitting: Pulmonary Disease

## 2012-05-09 DIAGNOSIS — J45909 Unspecified asthma, uncomplicated: Secondary | ICD-10-CM

## 2012-05-12 ENCOUNTER — Telehealth: Payer: Self-pay | Admitting: Pulmonary Disease

## 2012-05-12 NOTE — Telephone Encounter (Signed)
Called and spoke with rep at [employer] He states that they have not received anything from the pt yet He asked what ins she has, and per out recs she does not have any ins He states in this case, she needs to use the bridges to access form  ATC the pt, Had to [facility]

## 2012-05-12 NOTE — Telephone Encounter (Signed)
LMTCB

## 2012-05-12 NOTE — Telephone Encounter (Signed)
Returning call.

## 2012-05-13 NOTE — Telephone Encounter (Signed)
LMOMTCB x 1 

## 2012-05-14 NOTE — Telephone Encounter (Signed)
LMOMTCB x 1 

## 2012-05-14 NOTE — Telephone Encounter (Signed)
Spoke with patient-aware that she needs to come by and get Crystal Sampson to Access forms to fill out and we can fax back for patient. Will forward to Lawson Fiscal to look out for papers for patient.

## 2012-05-21 NOTE — Telephone Encounter (Signed)
lmomtcb x1 for pt to see if she came to pick up papers

## 2012-05-26 NOTE — Telephone Encounter (Signed)
Pt has the paperwork and is working on getting income info. Pt will bring by office once this is completed.Carron Curie, CMA

## 2012-05-26 NOTE — Telephone Encounter (Signed)
LMOMTCB x 1 

## 2012-05-26 NOTE — Telephone Encounter (Signed)
Pt return call. Please cb at [phone]

## 2012-05-27 ENCOUNTER — Other Ambulatory Visit: Payer: Self-pay | Admitting: Pulmonary Disease

## 2012-05-27 MED ORDER — FLUTICASONE-SALMETEROL 250-50 MCG/DOSE IN AEPB: 1.0000 | INHALATION_SPRAY | Freq: Two times a day (BID) | RESPIRATORY_TRACT | Status: DC

## 2012-05-27 NOTE — Telephone Encounter (Addendum)
Patient returned patient assistance papers. Rx for 90 day supply Advair 250-50  1 puff bid Rx placed on [facility] desk to sign.   Form needs to be faxed to [facility] to Access at [phone] once Rx signed.  Message to [facility] that Rx in folder.

## 2012-05-27 NOTE — Addendum Note (Signed)
Addended by: Nita Sells on: 05/27/2012 04:47 PM   Modules accepted: Orders

## 2012-05-27 NOTE — Telephone Encounter (Signed)
Medication printed for Dr Teddy Spike nurse, Ashtyn.  Advair 250-50 #180 1 puff bid x 3refills. 90 DAY SUPPLY.

## 2012-05-28 NOTE — Telephone Encounter (Signed)
RX signed by [facility] and has been faxed to [facility] to Access, along with, pt assistance forms to [phone].

## 2012-06-03 ENCOUNTER — Ambulatory Visit (INDEPENDENT_AMBULATORY_CARE_PROVIDER_SITE_OTHER): Payer: Self-pay | Admitting: Pulmonary Disease

## 2012-06-03 ENCOUNTER — Encounter: Payer: Self-pay | Admitting: Pulmonary Disease

## 2012-06-03 VITALS — BP 110/80 | HR 71 | Temp 98.2°F | Ht 68.0 in | Wt 201.0 lb

## 2012-06-03 DIAGNOSIS — J45909 Unspecified asthma, uncomplicated: Secondary | ICD-10-CM

## 2012-06-03 MED ORDER — MONTELUKAST SODIUM 10 MG PO TABS: 10.0000 mg | ORAL_TABLET | Freq: Every day | ORAL | Status: DC

## 2012-06-03 NOTE — Patient Instructions (Addendum)
Stay on advair religiously twice a day Try to sit down and collect yourself if you get short of breath before using your rescue inhaler. Will start on singulair 10mg  one a day for your allergic component of your asthma.  Keep your upcoming allergy apptm. Try taking zyrtec 10mg  over the counter at bedtime each night to help with your allergy symptoms. followup with me in 2 mos, but please call if you feel your breathing is worsening.

## 2012-06-03 NOTE — Progress Notes (Signed)
  Subjective:    Patient ID: Crystal Sampson, female    DOB: [birth year], 36 y.o.   MRN: [id number]  HPI The patient comes in today for followup of her known asthma.  I was able to locate her old chart, and noted that she has a history of a very elevated IgE level with very abnormal rast testing.  The patient is maintained on Advair since the last visit, but tells me that she is having to use her rescue inhaler excessively.  She is not sitting down and collecting herself when she develops dyspnea on exertion.  She gets very anxious and tells me that she begins to use her rescue inhaler frequently without thinking about the situation.  The patient does have dogs in her house, but is considering putting them up for foster adoption.  She denies any significant cough or chest congestion.  She has an upcoming allergy evaluation in approximately 3 weeks.   Review of Systems  Constitutional: Positive for unexpected weight change (gain). Negative for fever.  HENT: Positive for congestion and postnasal drip. Negative for ear pain, nosebleeds, sore throat, rhinorrhea, sneezing, trouble swallowing, dental problem and sinus pressure.   Eyes: Negative for redness and itching.  Respiratory: Positive for shortness of breath. Negative for cough, chest tightness and wheezing.   Cardiovascular: Negative for palpitations and leg swelling.  Gastrointestinal: Negative for nausea and vomiting.  Genitourinary: Negative for dysuria.  Musculoskeletal: Negative for joint swelling.  Skin: Negative for rash.  Neurological: Negative for headaches.  Hematological: Does not bruise/bleed easily.  Psychiatric/Behavioral: Positive for dysphoric mood. The patient is not nervous/anxious.        Objective:   Physical Exam Overweight female in no acute distress Nose without purulence or discharge noted Oropharynx clear Neck without lymphadenopathy or thyromegaly Chest with one isolated wheeze in the left upper lungs, otherwise  clear Cardiac exam with regular rate and rhythm Lower extremities without edema, no cyanosis alert and oriented, moves all 4 extremities.       Assessment & Plan:

## 2012-06-03 NOTE — Assessment & Plan Note (Signed)
The patient denies medical noncompliance with her maintenance inhaler, but still feels that she needs to use her rescue inhaler excessively.  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  She has an allergy evaluation scheduled for another 3 weeks, and in the meantime, I would like to start her on Singulair and also an over-the-counter antihistamine until that visit.  If she has any worsening of symptoms, she is to call us immediately.

## 2012-06-09 ENCOUNTER — Institutional Professional Consult (permissible substitution): Payer: Self-pay | Admitting: Internal Medicine

## 2012-06-12 ENCOUNTER — Institutional Professional Consult (permissible substitution): Payer: Self-pay | Admitting: Internal Medicine

## 2012-07-01 ENCOUNTER — Institutional Professional Consult (permissible substitution): Payer: Self-pay | Admitting: Internal Medicine

## 2012-07-17 ENCOUNTER — Ambulatory Visit (INDEPENDENT_AMBULATORY_CARE_PROVIDER_SITE_OTHER): Payer: Self-pay | Admitting: Internal Medicine

## 2012-07-17 ENCOUNTER — Encounter: Payer: Self-pay | Admitting: Internal Medicine

## 2012-07-17 ENCOUNTER — Other Ambulatory Visit: Payer: Self-pay

## 2012-07-17 VITALS — BP 130/82 | HR 88 | Ht 68.0 in | Wt 207.0 lb

## 2012-07-17 DIAGNOSIS — D824 Hyperimmunoglobulin E [IgE] syndrome: Secondary | ICD-10-CM

## 2012-07-17 DIAGNOSIS — J45998 Other asthma: Secondary | ICD-10-CM

## 2012-07-17 DIAGNOSIS — J45909 Unspecified asthma, uncomplicated: Secondary | ICD-10-CM

## 2012-07-17 DIAGNOSIS — J309 Allergic rhinitis, unspecified: Secondary | ICD-10-CM

## 2012-07-17 DIAGNOSIS — D71 Functional disorders of polymorphonuclear neutrophils: Secondary | ICD-10-CM

## 2012-07-17 MED ORDER — ALBUTEROL SULFATE (2.5 MG/3ML) 0.083% IN NEBU: 2.5000 mg | INHALATION_SOLUTION | Freq: Four times a day (QID) | RESPIRATORY_TRACT | Status: DC | PRN

## 2012-07-17 MED ORDER — LEVALBUTEROL HCL 0.63 MG/3ML IN NEBU
0.6300 mg | INHALATION_SOLUTION | Freq: Once | RESPIRATORY_TRACT | Status: AC
  Administered 2012-07-17: 0.63 mg via RESPIRATORY_TRACT

## 2012-07-17 NOTE — Patient Instructions (Addendum)
Neb Xop 0.63  Order- lab- allergy profile   Dx asthma, allergic rhinitis  Consider the environmental allergy control information I gave you

## 2012-07-17 NOTE — Progress Notes (Signed)
1/243/14- 36 yoF never smoker followed by Dr Shelle Iron for asthma and referred now for allergy evaluation. She gives a history of lifelong asthma/wheezing and allergic rhinitis. Environmental allergies and "stress" are important triggers. She denies problems from intolerance to food, unusual reaction to insect stings, or history of hives or atopic dermatitis.  She lives in a house with carpet, no basement, stasis and gas heat, no mold, 3 dogs.  She grew up in a smoking family but never smoked herself.  Allergy profile 03/20/2004-total IgE 1977.5 with an extremely high level for dog; almost all panel allergens were elevated.  IgE 01/04/2006 1877.5  Aspergillus antibody, CF 01/04/2006-< 1:8/ negative Brother has hay fever   Prior to Admission medications   Medication Sig Start Date End Date Taking? Authorizing Provider  Fluticasone-Salmeterol (ADVAIR) 250-50 MCG/DOSE AEPB Inhale 1 puff into the lungs every 12 (twelve) hours. 05/27/12  Yes Barbaraann Share, MD  albuterol (PROVENTIL) (2.5 MG/3ML) 0.083% nebulizer solution Take 3 mLs (2.5 mg total) by nebulization every 6 (six) hours as needed for wheezing or shortness of breath. 07/17/12 07/17/13  Waymon Budge, MD  predniSONE (DELTASONE) 20 MG tablet Take 1 tablet (20 mg total) by mouth 2 (two) times daily. 07/20/12   Otilio Miu, PA-C   Past Medical History  Diagnosis Date  . Asthma   . Depression     goes to [facility], quit taking meds  . 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 No past surgeries    Family History  Problem Relation Age of Onset  . Glaucoma Mother   . Hypertension Father   . Other Neg Hx    History   Social History  . Marital Status: Single    Spouse Name: N/A    Number of Children: N/A  . Years of Education: N/A   Occupational History  .      porter/transportation   Social History Main Topics  . Smoking status: Never Smoker   . Smokeless  tobacco: Never Used  . Alcohol Use: No  . Drug Use: No  . Sexually Active: Yes    Birth Control/ Protection: Condom, Implant     Comment: implanon since 2009   Other Topics Concern  . Not on file   Social History Narrative  . No narrative on file   ROS-see HPI Constitutional:   No-   weight loss, night sweats, fevers, chills, fatigue, lassitude. HEENT:   No-  headaches, difficulty swallowing, +tooth/dental problems, no-sore throat,       No-  sneezing, itching, ear ache, nasal congestion, post nasal drip,  CV:  No-   chest pain, orthopnea, PND, swelling in lower extremities, anasarca,                                  dizziness, palpitations Resp: +   shortness of breath with exertion or at rest.              No-   productive cough,  No non-productive cough,  No- coughing up of blood.              No-   change in color of mucus.  No- wheezing.   Skin: No-   rash or lesions. GI:  No-   heartburn, indigestion, abdominal pain, nausea, vomiting, diarrhea,  change in bowel habits, +loss of appetite GU: No-   dysuria, change in color of urine, no urgency or frequency.  No- flank pain. MS:  No-   joint pain or swelling.  No- decreased range of motion.  No- back pain. Neuro-     nothing unusual Psych:  No- change in mood or affect. + depression or anxiety.  No memory loss.  OBJ- Physical Exam General- Alert, Oriented, Affect-appropriate, Distress- none acute Skin- rash-none, lesions- none, excoriation- none Lymphadenopathy- none Head- atraumatic            Eyes- Gross vision intact, PERRLA, conjunctivae and secretions clear            Ears- Hearing, canals-normal            Nose- + mucus, +sniffing, no-Septal dev,  polyps, erosion, perforation             Throat- Mallampati II , mucosa clear , drainage- none, tonsils- atrophic Neck- flexible , trachea midline, no stridor , thyroid nl, carotid no bruit Chest - symmetrical excursion , unlabored           Heart/CV- RRR , no  murmur , no gallop  , no rub, nl s1 s2                           - JVD- none , edema- none, stasis changes- none, varices- none           Lung- clear to P&A, wheeze+ mild, cough- none , dullness-none, rub- none           Chest wall-  Abd- tender-no, distended-no, bowel sounds-present, HSM- no Br/ Gen/ Rectal- Not done, not indicated Extrem- cyanosis- none, clubbing, none, atrophy- none, strength- nl Neuro- grossly intact to observation

## 2012-07-18 LAB — ALLERGY FULL PROFILE
Alternaria Alternata: 0.45 kU/L — ABNORMAL HIGH
Bahia Grass: 18.5 kU/L — ABNORMAL HIGH
Box Elder IgE: <0.1 kU/L
Cat Dander: 28.5 kU/L — ABNORMAL HIGH
Curvularia lunata: 0.68 kU/L — ABNORMAL HIGH
Elm IgE: 0.17 kU/L — ABNORMAL HIGH
Fescue: 32.7 kU/L — ABNORMAL HIGH
G009 Red Top: 32.8 kU/L — ABNORMAL HIGH
Sycamore Tree: 0.21 kU/L — ABNORMAL HIGH

## 2012-07-19 ENCOUNTER — Emergency Department (HOSPITAL_COMMUNITY): Payer: Self-pay

## 2012-07-19 ENCOUNTER — Encounter (HOSPITAL_COMMUNITY): Payer: Self-pay | Admitting: Emergency Medicine

## 2012-07-19 ENCOUNTER — Emergency Department (HOSPITAL_COMMUNITY)
Admission: EM | Admit: 2012-07-19 | Discharge: 2012-07-20 | Disposition: A | Discharge status: Home / Self Care | Payer: Self-pay | Attending: Emergency Medicine | Admitting: Emergency Medicine

## 2012-07-19 DIAGNOSIS — Z79899 Other long term (current) drug therapy: Secondary | ICD-10-CM | POA: Insufficient documentation

## 2012-07-19 DIAGNOSIS — R0789 Other chest pain: Secondary | ICD-10-CM | POA: Insufficient documentation

## 2012-07-19 DIAGNOSIS — Z8619 Personal history of other infectious and parasitic diseases: Secondary | ICD-10-CM | POA: Insufficient documentation

## 2012-07-19 DIAGNOSIS — Z8659 Personal history of other mental and behavioral disorders: Secondary | ICD-10-CM | POA: Insufficient documentation

## 2012-07-19 DIAGNOSIS — J45901 Unspecified asthma with (acute) exacerbation: Secondary | ICD-10-CM | POA: Insufficient documentation

## 2012-07-19 DIAGNOSIS — IMO0002 Reserved for concepts with insufficient information to code with codable children: Secondary | ICD-10-CM | POA: Insufficient documentation

## 2012-07-19 DIAGNOSIS — R059 Cough, unspecified: Secondary | ICD-10-CM | POA: Insufficient documentation

## 2012-07-19 DIAGNOSIS — R05 Cough: Secondary | ICD-10-CM | POA: Insufficient documentation

## 2012-07-19 MED ORDER — ALBUTEROL SULFATE (5 MG/ML) 0.5% IN NEBU
5.0000 mg | INHALATION_SOLUTION | Freq: Once | RESPIRATORY_TRACT | Status: AC
  Administered 2012-07-19: 5 mg via RESPIRATORY_TRACT
  Filled 2012-07-19: qty 1

## 2012-07-19 MED ORDER — PREDNISONE 20 MG PO TABS
60.0000 mg | ORAL_TABLET | Freq: Once | ORAL | Status: AC
  Administered 2012-07-20: 60 mg via ORAL
  Filled 2012-07-19: qty 3
  Filled 2012-07-19: qty 1

## 2012-07-19 MED ORDER — IPRATROPIUM BROMIDE 0.02 % IN SOLN
0.5000 mg | Freq: Once | RESPIRATORY_TRACT | Status: AC
  Administered 2012-07-19: 0.5 mg via RESPIRATORY_TRACT
  Filled 2012-07-19: qty 2.5

## 2012-07-19 MED ORDER — ACETAMINOPHEN 325 MG PO TABS
650.0000 mg | ORAL_TABLET | Freq: Once | ORAL | Status: AC
  Administered 2012-07-20: 650 mg via ORAL
  Filled 2012-07-19: qty 2

## 2012-07-19 MED ORDER — ALBUTEROL (5 MG/ML) CONTINUOUS INHALATION SOLN
15.0000 mg/h | INHALATION_SOLUTION | RESPIRATORY_TRACT | Status: AC
  Administered 2012-07-20: 15 mg/h via RESPIRATORY_TRACT
  Filled 2012-07-19: qty 20

## 2012-07-19 NOTE — ED Notes (Signed)
Patient complaining shortness of breath that started today; worsening tonight.  Patient has history of asthma; patient was recently admitted three months ago for low oxygen.  Audible wheezing heard; breathing treatment initiated in triage.

## 2012-07-19 NOTE — ED Provider Notes (Signed)
History     CSN: [id number]  Arrival date & time 07/19/12  2028   First MD Initiated Contact with Patient 07/19/12 2234      Chief Complaint  Patient presents with  . Asthma  . Shortness of Breath    (Consider location/radiation/quality/duration/timing/severity/associated sxs/prior treatment) HPI History provided by pt.   Pt presents w/ c/o asthma attack.  Has been experiencing SOB that is aggravated by laying flat and exertion since early yesterday.  Associated w/ mild, non-radiating tightness in the center of her chest.  Used her albuterol inhaler w/out relief.  Has not had fever, coughing or wheezing.  Most recent asthma exacerbation 2 months ago, and typically presents w/ cough, wheezing, SOB and similar CP.  She is otherwise healthy.   Past Medical History  Diagnosis Date  . Asthma   . Depression     goes to [facility], quit taking meds  . 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 No past surgeries     Family History  Problem Relation Age of Onset  . Glaucoma Mother   . Hypertension Father   . Other Neg Hx     History  Substance Use Topics  . Smoking status: Never Smoker   . Smokeless tobacco: Never Used  . Alcohol Use: No    OB History    Grav Para Term Preterm Abortions TAB SAB Ect Mult Living   3 2 2  1 1    2       Review of Systems  All other systems reviewed and are negative.    Allergies  Penicillins  Home Medications   Current Outpatient Rx  Name  Route  Sig  Dispense  Refill  . ALBUTEROL SULFATE (2.5 MG/3ML) 0.083% IN NEBU   Nebulization   Take 3 mLs (2.5 mg total) by nebulization every 6 (six) hours as needed for wheezing or shortness of breath.   75 mL   12   . BENZONATATE 100 MG PO CAPS   Oral   Take 1 capsule (100 mg total) by mouth 3 (three) times daily as needed for cough.   20 capsule   0   . FLUTICASONE-SALMETEROL 250-50 MCG/DOSE IN AEPB   Inhalation   Inhale 1  puff into the lungs every 12 (twelve) hours.   180 each   3     90 DAY SUPPLY   . MONTELUKAST SODIUM 10 MG PO TABS   Oral   Take 1 tablet (10 mg total) by mouth at bedtime.   30 tablet   11     BP 133/81  Pulse 92  Temp 99.4 F (37.4 C) (Oral)  Resp 22  SpO2 100%  LMP 06/29/2012  Physical Exam  Nursing note and vitals reviewed. Constitutional: She is oriented to person, place, and time. She appears well-developed and well-nourished. No distress.  HENT:  Head: Normocephalic and atraumatic.  Eyes:       Normal appearance  Neck: Normal range of motion.  Cardiovascular: Normal rate and regular rhythm.   Pulmonary/Chest: Effort normal. No respiratory distress.       Diffuse expiratory wheezing.   Musculoskeletal: Normal range of motion.  Neurological: She is alert and oriented to person, place, and time.  Skin: Skin is warm and dry. No rash noted.  Psychiatric: She has a normal mood and affect. Her behavior is normal.    ED Course  Procedures (including  critical care time)  Labs Reviewed - No data to display Dg Chest 2 View  07/19/2012  *RADIOLOGY REPORT*  Clinical Data: Chest tightness, headache and shortness of breath. History of asthma.  CHEST - 2 VIEW  Comparison: Chest radiograph performed 03/23/2012  Findings: The lungs are well-aerated and clear.  There is no evidence of focal opacification, pleural effusion or pneumothorax.  The heart is normal in size; the mediastinal contour is within normal limits.  No acute osseous abnormalities are seen.  IMPRESSION: No acute cardiopulmonary process seen.   Original Report Authenticated By: Tonia Ghent, M.D.      1. Asthma exacerbation       MDM  37yo F presents w/ SOB and sub-sternal chest tightness.  H/o asthma and current sx typical, but normally associated w/ cough and wheezing as well.  On exam, pt afebrile, no respiratory distress, no coughing, diffuse expiratory rhonchi.  CXR neg.  She has already received one  breathing treatment and reports no relief of symptoms.  Will try a continuous neb.  She also reports a headache that started here in ED.  Tylenol ordered for pain.  Will reassess shortly.  12:02 AM   Pt reports that her sx are improved.  On repeat exam, wheezing decreased and no coughing.  Nursing staff ambulated, patient maintained her sats at 94% and did not become dyspneic. D/c'd home w/ prednisone.  Return precautions discussed. 2:35 AM         Otilio Miu, PA-C 07/20/12 [phone]

## 2012-07-20 MED ORDER — PREDNISONE 20 MG PO TABS: 20.0000 mg | ORAL_TABLET | Freq: Two times a day (BID) | ORAL | Status: DC

## 2012-07-20 NOTE — ED Provider Notes (Signed)
Medical screening examination/treatment/procedure(s) were performed by non-physician practitioner and as supervising physician I was immediately available for consultation/collaboration.   Hanley Seamen, MD 07/20/12 [phone]

## 2012-07-22 NOTE — Progress Notes (Signed)
Quick Note:  Called spoke with patient, advised of cxr results / recs as stated by TP. Pt verbalized her understanding and denied any questions. ______ 

## 2012-07-24 NOTE — Progress Notes (Signed)
Quick Note:  LMTCB ______ 

## 2012-07-27 DIAGNOSIS — D824 Hyperimmunoglobulin E [IgE] syndrome: Secondary | ICD-10-CM | POA: Insufficient documentation

## 2012-07-27 NOTE — Assessment & Plan Note (Signed)
Plan-allergy profile for update. We discussed environmental exposures. Particularly to dog. Allergy skin testing may  point to an option to try allergy vaccine. Xolair is not an option with IgE this high

## 2012-07-27 NOTE — Assessment & Plan Note (Signed)
Need to watch long-term for the possibility of blood/marrow malignancy. Scores like this can sometimes be seen with ABPA or some other fungal involvement of airways or sinuses

## 2012-07-31 NOTE — Progress Notes (Signed)
Quick Note:  Pt aware of results and next OV will discuss in greater detail. ______

## 2012-08-06 ENCOUNTER — Ambulatory Visit: Payer: Self-pay | Admitting: Pulmonary Disease

## 2012-08-07 ENCOUNTER — Ambulatory Visit: Payer: Self-pay | Admitting: Internal Medicine

## 2012-08-08 ENCOUNTER — Ambulatory Visit: Payer: Self-pay | Admitting: Pulmonary Disease

## 2012-08-26 ENCOUNTER — Encounter: Payer: Self-pay | Admitting: Pulmonary Disease

## 2012-08-26 ENCOUNTER — Ambulatory Visit (INDEPENDENT_AMBULATORY_CARE_PROVIDER_SITE_OTHER): Payer: Self-pay | Admitting: Pulmonary Disease

## 2012-08-26 VITALS — BP 130/82 | HR 108 | Temp 98.1°F | Ht 68.0 in | Wt 216.4 lb

## 2012-08-26 DIAGNOSIS — J45901 Unspecified asthma with (acute) exacerbation: Secondary | ICD-10-CM

## 2012-08-26 DIAGNOSIS — J019 Acute sinusitis, unspecified: Secondary | ICD-10-CM

## 2012-08-26 MED ORDER — PREDNISONE 10 MG PO TABS: ORAL_TABLET | ORAL | Status: DC

## 2012-08-26 MED ORDER — LEVOFLOXACIN 750 MG PO TABS: ORAL_TABLET | ORAL | Status: DC

## 2012-08-26 NOTE — Assessment & Plan Note (Signed)
The patient's history is most consistent with acute sinusitis.  Will treat her with a course of antibiotics, and I have also encouraged her to use sinus rinses.  If she has persistent symptoms, she will need a scan of her sinuses.

## 2012-08-26 NOTE — Assessment & Plan Note (Signed)
The patient is having increasing shortness of breath and also increased rescue inhaler use since she has had her sinus symptoms.  We'll treat her with a short course of prednisone to see if things improve.  I have stressed to her the importance of her upcoming allergy testing.

## 2012-08-26 NOTE — Patient Instructions (Addendum)
Will treat with a course of prednisone and 7 days of an antibiotic for sinusitis. Would use neilmed sinus rinses am and pm for next one week.  Stay on usual asthma regimen Please check at the desk and make sure you know the date for your allergy testing.  Keep already scheduled followup with me, but call if not improving.

## 2012-08-26 NOTE — Progress Notes (Signed)
  Subjective:    Patient ID: Crystal Sampson, female    DOB: July 28, 1975, 37 y.o.   MRN: [id number]  HPI The patient comes in today for an acute sick visit.  She has known severe allergic asthma, and was noted to have an acute exacerbation in January of this year which resolved with a trip to the emergency room for prednisone.  She felt she was doing well until about a week ago, when she began to develop sinus pressure and congestion, as well as increased cough.  She is not blowing purulent mucus from her nose, but is extremely congested.  She does not have chest congestion, nor is she coughing up mucus from her airways.  She is a little more short of breath than usual with wheezing, and has been using her rescue inhaler more often.  She is due to have her allergy testing done.   Review of Systems  Constitutional: Negative for fever and unexpected weight change.  HENT: Positive for congestion. Negative for ear pain, nosebleeds, sore throat, rhinorrhea, sneezing, trouble swallowing, dental problem, postnasal drip and sinus pressure.   Eyes: Negative for redness and itching.  Respiratory: Positive for chest tightness and wheezing. Negative for cough and shortness of breath.   Cardiovascular: Negative for palpitations and leg swelling.  Gastrointestinal: Negative for nausea and vomiting.  Genitourinary: Negative for dysuria.  Musculoskeletal: Positive for back pain. Negative for joint swelling.  Skin: Negative for rash.  Neurological: Negative for headaches.  Hematological: Does not bruise/bleed easily.  Psychiatric/Behavioral: Negative for dysphoric mood. The patient is not nervous/anxious.        Objective:   Physical Exam Obese female in no acute distress Nose with swollen mucosa and erythema, and no obvious purulence. Oropharynx clear Neck without lymphadenopathy or thyromegaly Chest with a few isolated wheezes, but adequate air flow Cardiac exam with regular rate and rhythm Lower  extremities with mild edema, no cyanosis Alert and oriented, moves all 4 extremities.        Assessment & Plan:

## 2012-08-26 NOTE — Addendum Note (Signed)
Addended by: Orma Flaming D on: 08/26/2012 04:37 PM   Modules accepted: Orders

## 2012-09-02 ENCOUNTER — Ambulatory Visit: Payer: Self-pay | Admitting: Internal Medicine

## 2012-10-01 ENCOUNTER — Telehealth: Payer: Self-pay | Admitting: Pulmonary Disease

## 2012-10-01 MED ORDER — ALBUTEROL SULFATE HFA 108 (90 BASE) MCG/ACT IN AERS: 2.0000 | INHALATION_SPRAY | Freq: Four times a day (QID) | RESPIRATORY_TRACT | Status: DC | PRN

## 2012-10-01 NOTE — Telephone Encounter (Signed)
Per Irving Burton pt does now have insurance but is not yet active Has been having occasional wheezing d/t moving Had to use friend's rescue inhaler d/t unable to afford rx Offered ov, but pt declined at this time Has upcoming ov w/ CY on 4.21.14 - pt to keep this appt 1 sample ventolin given to pt Pt to call if symptoms persist or worsen for sooner appt Documented per protocol Nothing further needed at this time; will sign off  Ventolin HFA added back to med list as it was taken off d/t an "end date" added to her last rx

## 2012-10-13 ENCOUNTER — Ambulatory Visit (INDEPENDENT_AMBULATORY_CARE_PROVIDER_SITE_OTHER): Payer: BC Managed Care – PPO | Admitting: Internal Medicine

## 2012-10-13 ENCOUNTER — Encounter: Payer: Self-pay | Admitting: Internal Medicine

## 2012-10-13 VITALS — BP 112/74 | HR 92 | Ht 67.75 in | Wt 220.4 lb

## 2012-10-13 DIAGNOSIS — J302 Other seasonal allergic rhinitis: Secondary | ICD-10-CM

## 2012-10-13 DIAGNOSIS — D824 Hyperimmunoglobulin E [IgE] syndrome: Secondary | ICD-10-CM

## 2012-10-13 DIAGNOSIS — J309 Allergic rhinitis, unspecified: Secondary | ICD-10-CM

## 2012-10-13 DIAGNOSIS — J45909 Unspecified asthma, uncomplicated: Secondary | ICD-10-CM

## 2012-10-13 DIAGNOSIS — D71 Functional disorders of polymorphonuclear neutrophils: Secondary | ICD-10-CM

## 2012-10-13 DIAGNOSIS — J3089 Other allergic rhinitis: Secondary | ICD-10-CM

## 2012-10-13 DIAGNOSIS — J45998 Other asthma: Secondary | ICD-10-CM

## 2012-10-13 MED ORDER — AZELASTINE-FLUTICASONE 137-50 MCG/ACT NA SUSP: 1.0000 | Freq: Every day | NASAL | Status: DC

## 2012-10-13 NOTE — Patient Instructions (Addendum)
Sample and script Dymista nasal spray   Try 1-2 puffs each nostril once daily at bedtime  Schedule allergy skin testing. Remember that antihistamines directly block the skin tests. For 3 days before your testing- no antihistamines, cold or sinus products, otc sleep meds or cough syrups. Also no Dymista.  Please call with any questions.

## 2012-10-13 NOTE — Progress Notes (Signed)
1/243/14- 36 yoF never smoker followed by Dr Shelle Iron for asthma and referred now for allergy evaluation. She gives a history of lifelong asthma/wheezing and allergic rhinitis. Environmental allergies and "stress" are important triggers. She denies problems from intolerance to food, unusual reaction to insect stings, or history of hives or atopic dermatitis.  She lives in a house with carpet, no basement, stasis and gas heat, no mold, 3 dogs.  She grew up in a smoking family but never smoked herself.  Allergy profile 03/20/2004-total IgE 1977.5 with an extremely high level for dog; almost all panel allergens were elevated.  IgE 01/04/2006 1877.5  Aspergillus antibody, CF 01/04/2006-< 1:8/ negative Brother has hay fever   10/13/12- 36 yoF never smoker followed by Dr Shelle Iron for asthma and by Dr Maple Hudson for allergy evaluation. FOLLOWS FOR: had increase in allergy flare up since last visit; SOB as well.  Tired, rebounded from last prednisone course-discussed. Afraid of albuterol nebulizer solution because she had heard there was a batch recalled.. Seasonal rhinitis with crusting and drainage from nose. Admits anxious personality, worried about her ability to initiate and maintain sleep. Allergy Profile 07/17/2012-total IgE 1144.9 with elevation of all potential allergens on the panel.  ROS-see HPI Constitutional:   No-   weight loss, night sweats, fevers, chills, fatigue, lassitude. HEENT:   No-  headaches, difficulty swallowing, +tooth/dental problems, no-sore throat,       +sneezing, itching, ear ache, nasal congestion, post nasal drip,  CV:  No-   chest pain, orthopnea, PND, swelling in lower extremities, anasarca,                                  dizziness, palpitations Resp: +   shortness of breath with exertion or at rest.              No-   productive cough,  No non-productive cough,  No- coughing up of blood.              No-   change in color of mucus.  No- wheezing.   Skin: No-   rash or  lesions. GI:  No-   heartburn, indigestion, abdominal pain, nausea, vomiting, diarrhea,  GU:  MS:  No-   joint pain or swelling.   Neuro-     nothing unusual Psych:  No- change in mood or affect. + depression or anxiety.  No memory loss.  OBJ- Physical Exam General- Alert, Oriented, Affect-appropriate, Distress- none acute Skin- rash-none, lesions- none, excoriation- none Lymphadenopathy- none Head- atraumatic            Eyes- Gross vision intact, PERRLA, conjunctivae and secretions clear            Ears- Hearing, canals-normal            Nose- + mucus, +turbinate edema, no-Septal dev,  polyps, erosion, perforation             Throat- Mallampati II , mucosa clear , drainage- none, tonsils- atrophic Neck- flexible , trachea midline, no stridor , thyroid nl, carotid no bruit Chest - symmetrical excursion , unlabored           Heart/CV- RRR , no murmur , no gallop  , no rub, nl s1 s2                           - JVD- none , edema- none, stasis  changes- none, varices- none           Lung- clear to P&A, wheeze+ mild, cough- none , dullness-none, rub- none           Chest wall-  Abd-  Br/ Gen/ Rectal- Not done, not indicated Extrem- cyanosis- none, clubbing, none, atrophy- none, strength- nl Neuro- grossly intact to observation

## 2012-10-19 DIAGNOSIS — J302 Other seasonal allergic rhinitis: Secondary | ICD-10-CM | POA: Insufficient documentation

## 2012-10-19 NOTE — Assessment & Plan Note (Signed)
She is prone to insomnia. We discussed stimulant bronchodilators and the effect of systemic steroids.

## 2012-10-19 NOTE — Assessment & Plan Note (Signed)
Plan-return for allergy skin testing. Try Dymista nasal spray

## 2012-10-19 NOTE — Assessment & Plan Note (Signed)
Continued elevation of total IgE. This "raises all boats" and results in elevation of all allergy panel components. Plan-schedule allergy skin testing

## 2012-10-30 ENCOUNTER — Emergency Department (HOSPITAL_COMMUNITY)
Admission: EM | Admit: 2012-10-30 | Discharge: 2012-10-30 | Disposition: A | Discharge status: Home / Self Care | Payer: BC Managed Care – PPO | Attending: Emergency Medicine | Admitting: Emergency Medicine

## 2012-10-30 ENCOUNTER — Encounter (HOSPITAL_COMMUNITY): Payer: Self-pay | Admitting: *Deleted

## 2012-10-30 DIAGNOSIS — Z792 Long term (current) use of antibiotics: Secondary | ICD-10-CM | POA: Insufficient documentation

## 2012-10-30 DIAGNOSIS — K029 Dental caries, unspecified: Secondary | ICD-10-CM | POA: Insufficient documentation

## 2012-10-30 DIAGNOSIS — Z8619 Personal history of other infectious and parasitic diseases: Secondary | ICD-10-CM | POA: Insufficient documentation

## 2012-10-30 DIAGNOSIS — R509 Fever, unspecified: Secondary | ICD-10-CM

## 2012-10-30 DIAGNOSIS — J45909 Unspecified asthma, uncomplicated: Secondary | ICD-10-CM | POA: Insufficient documentation

## 2012-10-30 DIAGNOSIS — Z79899 Other long term (current) drug therapy: Secondary | ICD-10-CM | POA: Insufficient documentation

## 2012-10-30 DIAGNOSIS — K0889 Other specified disorders of teeth and supporting structures: Secondary | ICD-10-CM

## 2012-10-30 DIAGNOSIS — Z880 Allergy status to penicillin: Secondary | ICD-10-CM | POA: Insufficient documentation

## 2012-10-30 DIAGNOSIS — Z8659 Personal history of other mental and behavioral disorders: Secondary | ICD-10-CM | POA: Insufficient documentation

## 2012-10-30 DIAGNOSIS — K089 Disorder of teeth and supporting structures, unspecified: Secondary | ICD-10-CM | POA: Insufficient documentation

## 2012-10-30 DIAGNOSIS — R220 Localized swelling, mass and lump, head: Secondary | ICD-10-CM

## 2012-10-30 LAB — CBC WITH DIFFERENTIAL/PLATELET
Basophils Absolute: 0.1 10*3/uL (ref 0.0–0.1)
Basophils Relative: 0 % (ref 0–1)
Eosinophils Absolute: 0.3 10*3/uL (ref 0.0–0.7)
Eosinophils Relative: 2 % (ref 0–5)
HCT: 35.9 % — ABNORMAL LOW (ref 36.0–46.0)
MCH: 31.1 pg (ref 26.0–34.0)
MCHC: 35.4 g/dL (ref 30.0–36.0)
MCV: 87.8 fL (ref 78.0–100.0)
Monocytes Absolute: 1 10*3/uL (ref 0.1–1.0)
Neutro Abs: 10.2 10*3/uL — ABNORMAL HIGH (ref 1.7–7.7)
RDW: 12.2 % (ref 11.5–15.5)

## 2012-10-30 MED ORDER — SODIUM CHLORIDE 0.9 % IV BOLUS (SEPSIS)
1000.0000 mL | Freq: Once | INTRAVENOUS | Status: AC
  Administered 2012-10-30: 1000 mL via INTRAVENOUS

## 2012-10-30 MED ORDER — HYDROCODONE-ACETAMINOPHEN 5-325 MG PO TABS: 1.0000 | ORAL_TABLET | Freq: Four times a day (QID) | ORAL | Status: DC | PRN

## 2012-10-30 MED ORDER — ACETAMINOPHEN 500 MG PO TABS
1000.0000 mg | ORAL_TABLET | Freq: Once | ORAL | Status: AC
  Administered 2012-10-30: 1000 mg via ORAL
  Filled 2012-10-30: qty 2

## 2012-10-30 MED ORDER — CLINDAMYCIN PHOSPHATE 600 MG/50ML IV SOLN
600.0000 mg | Freq: Once | INTRAVENOUS | Status: AC
  Administered 2012-10-30: 600 mg via INTRAVENOUS
  Filled 2012-10-30: qty 50

## 2012-10-30 MED ORDER — CLINDAMYCIN HCL 150 MG PO CAPS: 300.0000 mg | ORAL_CAPSULE | Freq: Three times a day (TID) | ORAL | Status: DC

## 2012-10-30 NOTE — ED Provider Notes (Signed)
History     CSN: [id number]  Arrival date & time 10/30/12  0604   None     Chief Complaint  Patient presents with  . Facial Swelling    (Consider location/radiation/quality/duration/timing/severity/associated sxs/prior treatment) HPI Comments: Patient is a 37 y/o female who presents for dental pain since yesterday with onset of facial swelling this morning. Patient states that she had a throbbing dental pain on upper L side that was nonradiating yesterday. Admits to taking BC powder at bedtime with some relief and also feeling like her "jaw was numb" at this time. Patient went to sleep around 12:30AM and woke up at [facility] with L cheek swelling. Patient denies any aggravating or alleviating factors of her swelling. She further denies fevers, chills, eye pain or redness, ear pain or discharge, sore throat, drooling, difficulty swallowing, oral bleeding or lesions, neck pain or stiffness, chest pain, and SOB.  The history is provided by the patient. No language interpreter was used.    Past Medical History  Diagnosis Date  . Asthma   . Depression     goes to [facility], quit taking meds  . 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 No past surgeries      Family History  Problem Relation Age of Onset  . Glaucoma Mother   . Hypertension Father   . Other Neg Hx     History  Substance Use Topics  . Smoking status: Never Smoker   . Smokeless tobacco: Never Used  . Alcohol Use: No    OB History   Grav Para Term Preterm Abortions TAB SAB Ect Mult Living   3 2 2  1 1    2       Review of Systems  Constitutional: Negative for fever and chills.  HENT: Positive for facial swelling. Negative for hearing loss, ear pain, congestion, sore throat, trouble swallowing, neck pain, neck stiffness and ear discharge.   Eyes: Negative for pain and redness.  Respiratory: Negative for shortness of breath and stridor.     Cardiovascular: Negative for chest pain.  Skin: Negative for color change.  Neurological: Negative for weakness and headaches.  All other systems reviewed and are negative.    Allergies  Penicillins  Home Medications   Current Outpatient Rx  Name  Route  Sig  Dispense  Refill  . albuterol (PROVENTIL HFA;VENTOLIN HFA) 108 (90 BASE) MCG/ACT inhaler   Inhalation   Inhale 2 puffs into the lungs every 6 (six) hours as needed for wheezing.         . Fluticasone-Salmeterol (ADVAIR) 250-50 MCG/DOSE AEPB   Inhalation   Inhale 1 puff into the lungs every 12 (twelve) hours.   180 each   3     90 DAY SUPPLY   . albuterol (PROVENTIL) (2.5 MG/3ML) 0.083% nebulizer solution   Nebulization   Take 3 mLs (2.5 mg total) by nebulization every 6 (six) hours as needed for wheezing or shortness of breath.   75 mL   12   . clindamycin (CLEOCIN) 150 MG capsule   Oral   Take 2 capsules (300 mg total) by mouth 3 (three) times daily. May dispense as 150mg  capsules   60 capsule   0   . HYDROcodone-acetaminophen (NORCO/VICODIN) 5-325 MG per tablet   Oral   Take 1 tablet by mouth every 6 (six) hours as needed for pain.   10 tablet  0     BP 112/65  Pulse 89  Temp(Src) 99.8 F (37.7 C) (Oral)  Resp 17  SpO2 95%  LMP 10/27/2012  Physical Exam  Nursing note and vitals reviewed. Constitutional: She is oriented to person, place, and time. She appears well-developed and well-nourished.  HENT:  Head: Normocephalic and atraumatic. No trismus in the jaw.  Right Ear: Tympanic membrane, external ear and ear canal normal. No tenderness. No mastoid tenderness.  Left Ear: Tympanic membrane, external ear and ear canal normal. No tenderness. No mastoid tenderness.  Nose: Right sinus exhibits no maxillary sinus tenderness. Left sinus exhibits maxillary sinus tenderness.  Mouth/Throat: Uvula is midline and mucous membranes are normal. No oral lesions. Abnormal dentition. Dental caries present. No  edematous or lacerations. No oropharyngeal exudate, posterior oropharyngeal edema, posterior oropharyngeal erythema or tonsillar abscesses.    TTP of first and second premolar with surrounding gingival swelling and erythema. Teeth significantly cracked with caries. No area of fluctuance appreciated. No oral bleeding, lesions, or trismus.  Eyes: Conjunctivae and EOM are normal. Pupils are equal, round, and reactive to light. No scleral icterus.  No pan with EOMs  Neck: Normal range of motion. Neck supple.  Cardiovascular: Regular rhythm and normal heart sounds.   Tachycardic to 104  Pulmonary/Chest: Effort normal and breath sounds normal. No respiratory distress. She has no wheezes. She has no rales.  Musculoskeletal: Normal range of motion.  Lymphadenopathy:    She has cervical adenopathy.  Neurological: She is alert and oriented to person, place, and time.  Skin: Skin is warm and dry. No rash noted. No erythema. No pallor.  Skin on face without erythema  Psychiatric: She has a normal mood and affect. Her behavior is normal.    ED Course  Procedures (including critical care time)  Labs Reviewed  CBC WITH DIFFERENTIAL - Abnormal; Notable for the following:    WBC 13.7 (*)    HCT 35.9 (*)    Neutro Abs 10.2 (*)    All other components within normal limits   No results found.   1. Left facial swelling   2. Pain, dental   3. Fever      MDM  Patient presents for left upper dental pain with L sided facial swelling, onset this AM. Patient febrile and tachycardic in triage. On physical exam patient nontoxic appearing and tachycardic to 104. TTP of first and second L upper premolar appreciated with surrounding gingival swelling and erythema; no trismus and no area of fluctuance consistent with drainable abscess. Patient to be started on IV clindamycin in ED and given tylenol for fever. CBC ordered.  CBC significant for leukocytosis of 13.7. Patient continues to be nontoxic appearing;  denies any pain. Patient is laying in the bed with her left cheek pressed against her left hand. Patient no longer tachycardic with heart rate 93. Temperature trending down. Patient hemodynamically stable and appropriate for discharge with dental and oral surgery followup. Patient to begin PO antibiotics to take for infection. Indications for ED return discussed. Patient advised to seek followup within 24 hours; if she is unable to follow up with a dentist or oral surgeon in 24 hours return to the emergency department for recheck strongly advised. Patient states comfort and understanding with this discharge plan with no unaddressed concerns. Patient workup and management discussed with Dr. Radford Pax who is in agreement.   Filed Vitals:   10/30/12 0608 10/30/12 0911  BP: 127/94 112/65  Pulse: 115 89  Temp: 100.1 F (37.8  C) 99.8 F (37.7 C)  TempSrc:  Oral  Resp: 20 17  SpO2: 95% 95%         Antony Madura, PA-C 10/30/12 [phone]

## 2012-10-30 NOTE — ED Notes (Signed)
Pt states had toothache yesterday; took bc powder at bedtime; woke up with facial swelling; temp

## 2012-10-30 NOTE — ED Notes (Signed)
Pt alert and oriented x4. Respirations even and unlabored. Bilateral rise and fall of chest. Skin warm and dry. In no acute distress. Denies needs.  

## 2012-10-30 NOTE — ED Provider Notes (Signed)
Medical screening examination/treatment/procedure(s) were performed by non-physician practitioner and as supervising physician I was immediately available for consultation/collaboration.    Nelia Shi, MD 10/30/12 1004

## 2012-10-31 ENCOUNTER — Emergency Department (HOSPITAL_COMMUNITY)
Admission: EM | Admit: 2012-10-31 | Discharge: 2012-10-31 | Disposition: A | Discharge status: Home / Self Care | Payer: BC Managed Care – PPO | Attending: Emergency Medicine | Admitting: Emergency Medicine

## 2012-10-31 ENCOUNTER — Encounter (HOSPITAL_COMMUNITY): Payer: Self-pay | Admitting: Emergency Medicine

## 2012-10-31 DIAGNOSIS — J45909 Unspecified asthma, uncomplicated: Secondary | ICD-10-CM | POA: Insufficient documentation

## 2012-10-31 DIAGNOSIS — Z8659 Personal history of other mental and behavioral disorders: Secondary | ICD-10-CM | POA: Insufficient documentation

## 2012-10-31 DIAGNOSIS — Z8619 Personal history of other infectious and parasitic diseases: Secondary | ICD-10-CM | POA: Insufficient documentation

## 2012-10-31 DIAGNOSIS — IMO0002 Reserved for concepts with insufficient information to code with codable children: Secondary | ICD-10-CM | POA: Insufficient documentation

## 2012-10-31 DIAGNOSIS — Z79899 Other long term (current) drug therapy: Secondary | ICD-10-CM | POA: Insufficient documentation

## 2012-10-31 DIAGNOSIS — K029 Dental caries, unspecified: Secondary | ICD-10-CM | POA: Insufficient documentation

## 2012-10-31 DIAGNOSIS — Z880 Allergy status to penicillin: Secondary | ICD-10-CM | POA: Insufficient documentation

## 2012-10-31 DIAGNOSIS — K047 Periapical abscess without sinus: Secondary | ICD-10-CM

## 2012-10-31 DIAGNOSIS — K044 Acute apical periodontitis of pulpal origin: Secondary | ICD-10-CM | POA: Insufficient documentation

## 2012-10-31 MED ORDER — LIDOCAINE-EPINEPHRINE (PF) 2 %-1:200000 IJ SOLN
20.0000 mL | Freq: Once | INTRAMUSCULAR | Status: AC
  Administered 2012-10-31: 20 mL via INTRADERMAL

## 2012-10-31 NOTE — ED Provider Notes (Signed)
History     CSN: [id number]  Arrival date & time 10/31/12  1035   First MD Initiated Contact with Patient 10/31/12 1223      No chief complaint on file.   (Consider location/radiation/quality/duration/timing/severity/associated sxs/prior treatment) HPI Comments: Crystal Sampson is a 37 y.o. Female who is here for worsening swelling in her upper lip. She was seen yesterday, evaluate for similar problem, treated with clindamycin, for dental infection. She has taken 3 doses of clindamycin. She was referred to dentistry, and oral surgery, but did not call either. She denies fever, chills, nausea, vomiting, weakness, or dizziness. The swelling causes a "pressure-like" pain. She has not had dental care in many, many years. There are no other known modifying factors.  The history is provided by the patient.    Past Medical History  Diagnosis Date  . Asthma   . Depression     goes to [facility], quit taking meds  . 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 No past surgeries      Family History  Problem Relation Age of Onset  . Glaucoma Mother   . Hypertension Father   . Other Neg Hx     History  Substance Use Topics  . Smoking status: Never Smoker   . Smokeless tobacco: Never Used  . Alcohol Use: No    OB History   Grav Para Term Preterm Abortions TAB SAB Ect Mult Living   3 2 2  1 1    2       Review of Systems  All other systems reviewed and are negative.    Allergies  Penicillins  Home Medications   Current Outpatient Rx  Name  Route  Sig  Dispense  Refill  . albuterol (PROVENTIL HFA;VENTOLIN HFA) 108 (90 BASE) MCG/ACT inhaler   Inhalation   Inhale 2 puffs into the lungs every 6 (six) hours as needed for wheezing.         . clindamycin (CLEOCIN) 150 MG capsule   Oral   Take 2 capsules (300 mg total) by mouth 3 (three) times daily. May dispense as 150mg  capsules   60 capsule   0    . Fluticasone-Salmeterol (ADVAIR) 250-50 MCG/DOSE AEPB   Inhalation   Inhale 1 puff into the lungs every 12 (twelve) hours.   180 each   3     90 DAY SUPPLY   . HYDROcodone-acetaminophen (NORCO/VICODIN) 5-325 MG per tablet   Oral   Take 1 tablet by mouth every 6 (six) hours as needed for pain.   10 tablet   0     BP 133/66  Pulse 99  Temp(Src) 99.7 F (37.6 C) (Oral)  Resp 18  SpO2 96%  LMP 10/27/2012  Physical Exam  Nursing note and vitals reviewed. Constitutional: She is oriented to person, place, and time. She appears well-developed and well-nourished.  HENT:  Head: Normocephalic and atraumatic.  Mild left upper lip swelling, with localized tenderness. Normal TMJ motion. Very poor dentition with numerous caries. Intraoral examination is consistent with a abscess associated with the left lateral incisor tooth which is eroded away to the base.  Eyes: Conjunctivae and EOM are normal. Pupils are equal, round, and reactive to light.  Neck: Normal range of motion and phonation normal. Neck supple.  Cardiovascular: Normal rate, regular rhythm and intact distal pulses.   Pulmonary/Chest: Effort normal and breath sounds normal. She exhibits  no tenderness.  Abdominal: Soft. She exhibits no distension. There is no tenderness. There is no guarding.  Musculoskeletal: Normal range of motion.  Neurological: She is alert and oriented to person, place, and time. She has normal strength. She exhibits normal muscle tone.  Skin: Skin is warm and dry.  Psychiatric: She has a normal mood and affect. Her behavior is normal. Judgment and thought content normal.    ED Course  Procedures (including critical care time)  Procedure- I&D oral abscess: Abscess left upper buccal gutter- local anesthesia 2% Xylocaine with epinephrine. After appropriate anesthesia, the mass was incised with a #11 blade, using a puncture approach, approximately 0.5 cm width of blade). The wound was probed using  forceps- no pus returned.    2:49 PM Reevaluation with update and discussion. After initial assessment and treatment, an updated evaluation reveals No pain, no bleeding. She feels better. [place]    1. Dental infection   2. Dental caries       MDM  Dental infection with local swelling, consistent with cellulitis. Doubt significant deep abscess. Doubt metabolic instability, serious bacterial infection or impending vascular collapse; the patient is stable for discharge.        Flint Melter, MD 10/31/12 1520

## 2012-10-31 NOTE — ED Notes (Signed)
Pt has facial/lip swelling that started yesterday. Pt has this occur in the past when she was on penicillin. Pt is not on penicillin at this time. No airway distress noted.

## 2012-12-24 ENCOUNTER — Ambulatory Visit: Payer: BC Managed Care – PPO | Admitting: Internal Medicine

## 2012-12-29 ENCOUNTER — Ambulatory Visit: Payer: Self-pay | Admitting: Pulmonary Disease

## 2013-01-01 ENCOUNTER — Encounter: Payer: Self-pay | Admitting: Pulmonary Disease

## 2013-02-06 ENCOUNTER — Telehealth: Payer: Self-pay | Admitting: Pulmonary Disease

## 2013-02-06 MED ORDER — ALBUTEROL SULFATE HFA 108 (90 BASE) MCG/ACT IN AERS: 2.0000 | INHALATION_SPRAY | Freq: Four times a day (QID) | RESPIRATORY_TRACT | Status: DC | PRN

## 2013-02-06 NOTE — Telephone Encounter (Signed)
Spoke with pt.  She is needing an albuterol hfa rx.  I have provided pt with 1 sample and asked she keep OV with KC on Monday.  She verbalized understanding and was very appreciative of sample.

## 2013-02-09 ENCOUNTER — Ambulatory Visit (INDEPENDENT_AMBULATORY_CARE_PROVIDER_SITE_OTHER): Payer: BC Managed Care – PPO | Admitting: Pulmonary Disease

## 2013-02-09 ENCOUNTER — Encounter: Payer: Self-pay | Admitting: Pulmonary Disease

## 2013-02-09 ENCOUNTER — Telehealth: Payer: Self-pay | Admitting: Pulmonary Disease

## 2013-02-09 VITALS — BP 122/78 | HR 88 | Temp 98.0°F | Ht 68.0 in | Wt 222.6 lb

## 2013-02-09 DIAGNOSIS — J45909 Unspecified asthma, uncomplicated: Secondary | ICD-10-CM

## 2013-02-09 DIAGNOSIS — J45998 Other asthma: Secondary | ICD-10-CM

## 2013-02-09 NOTE — Patient Instructions (Addendum)
Stay on advair, ok to stay off singulair since you did not have a response.   Will send a note to Dr. Roxy Cedar nurse to figure out about rescheduling testing.  followup with me in 6mos if doing well.

## 2013-02-09 NOTE — Assessment & Plan Note (Signed)
The patient seems to be doing well from an asthma standpoint, but I continue to be concerned about the allergic component to her disease.  I have asked her to continue on Advair for now, and we'll try and get her back with allergy to complete her evaluation.

## 2013-02-09 NOTE — Progress Notes (Signed)
  Subjective:    Patient ID: Crystal Sampson, female    DOB: Nov 05, 1975, 37 y.o.   MRN: [id number]  HPI Patient comes in today for followup of her known asthma with significant allergic component.  She has been staying on her Advair compliantly, but stopped using Singulair because she felt it did not help her symptoms.  She is being followed closely by allergy, but it appears that she has missed her followup for skin testing.  She feels that she has done fairly well since last visit, and has not had a recent acute exacerbation.  She is not over using her rescue inhaler.   Review of Systems  Constitutional: Negative for fever and unexpected weight change.  HENT: Positive for congestion, rhinorrhea, sneezing and sinus pressure. Negative for ear pain, nosebleeds, sore throat, trouble swallowing, dental problem and postnasal drip.   Eyes: Negative for redness and itching.  Respiratory: Negative for cough, chest tightness, shortness of breath and wheezing.   Cardiovascular: Negative for palpitations and leg swelling.  Gastrointestinal: Negative for nausea and vomiting.  Genitourinary: Negative for dysuria.  Musculoskeletal: Negative for joint swelling.  Skin: Negative for rash.  Neurological: Positive for headaches.  Hematological: Does not bruise/bleed easily.  Psychiatric/Behavioral: Negative for dysphoric mood. The patient is not nervous/anxious.        Objective:   Physical Exam Obese female in no acute distress Nose without purulence or discharge noted Neck without lymphadenopathy or thyromegaly Chest with clear breath sounds, no wheezing Cardiac exam with regular rate and rhythm Lower extremities without edema, no cyanosis Alert and oriented, moves all 4 extremities.       Assessment & Plan:

## 2013-02-09 NOTE — Telephone Encounter (Signed)
Pt needs to reschedule ??skin testing??.  She has missed f/u apptms.   Thanks.

## 2013-02-13 NOTE — Telephone Encounter (Signed)
Spoke with [facility] about patient; aware that patient has allergy skin test appointment scheduled for 03-25-2013 at 3:00pm. Nothing more needed at this time.

## 2013-03-10 ENCOUNTER — Telehealth: Payer: Self-pay | Admitting: Pulmonary Disease

## 2013-03-10 MED ORDER — FLUTICASONE-SALMETEROL 250-50 MCG/DOSE IN AEPB: 1.0000 | INHALATION_SPRAY | Freq: Two times a day (BID) | RESPIRATORY_TRACT | Status: DC

## 2013-03-10 NOTE — Telephone Encounter (Signed)
Pt advised no samples available. I provided her with the # for bridges to access. Pt ask that an RX be sent to [facility] [place]. Rx sent. Carron Curie, CMA

## 2013-03-17 ENCOUNTER — Ambulatory Visit (INDEPENDENT_AMBULATORY_CARE_PROVIDER_SITE_OTHER): Payer: BC Managed Care – PPO | Admitting: Adult Health

## 2013-03-17 ENCOUNTER — Encounter: Payer: Self-pay | Admitting: Adult Health

## 2013-03-17 VITALS — BP 136/82 | HR 88 | Temp 99.4°F | Ht 68.0 in | Wt 221.6 lb

## 2013-03-17 DIAGNOSIS — J45901 Unspecified asthma with (acute) exacerbation: Secondary | ICD-10-CM

## 2013-03-17 DIAGNOSIS — J45998 Other asthma: Secondary | ICD-10-CM

## 2013-03-17 DIAGNOSIS — J45909 Unspecified asthma, uncomplicated: Secondary | ICD-10-CM

## 2013-03-17 MED ORDER — AZITHROMYCIN 250 MG PO TABS: ORAL_TABLET | ORAL | Status: AC

## 2013-03-17 MED ORDER — LEVALBUTEROL HCL 0.63 MG/3ML IN NEBU
0.6300 mg | INHALATION_SOLUTION | Freq: Once | RESPIRATORY_TRACT | Status: AC
  Administered 2013-03-17: 0.63 mg via RESPIRATORY_TRACT

## 2013-03-17 MED ORDER — PREDNISONE 10 MG PO TABS: ORAL_TABLET | ORAL | Status: DC

## 2013-03-17 NOTE — Assessment & Plan Note (Signed)
Flare  xopenex neb given in office   Plan  Zpack take as directed.  Prednisone taper over next week.  Mucinex DM Twice daily As needed  Cough/congestion  Saline nasal rinses As needed   Restart Advair 1 puff Twice daily   Please contact office for sooner follow up if symptoms do not improve or worsen or seek emergency care  Follow up Dr. Shelle Iron as planned

## 2013-03-17 NOTE — Patient Instructions (Addendum)
Zpack take as directed.  Prednisone taper over next week.  Mucinex DM Twice daily As needed  Cough/congestion  Saline nasal rinses As needed   Restart Advair 1 puff Twice daily   Please contact office for sooner follow up if symptoms do not improve or worsen or seek emergency care  Follow up Dr. Shelle Iron as planned

## 2013-03-17 NOTE — Progress Notes (Signed)
  Subjective:    Patient ID: Crystal Sampson, female    DOB: [birth year], 37 y.o.   MRN: [id number]  HPI 37 yo female with known hx of known asthma with significant allergic component  03/17/2013 Acute OV  Complains of  increased SOB esp at night, some chest tightness, some wheezing, head congestion with light green mucus x1 week.  denies f/c/s, nausea/vomiting, hemoptysis Has been out of advair x2 weeks -forgot to pick up .  She has been using mucinex without much help.  No fever , recent travel or abx use.    Review of Systems Constitutional:   No  weight loss, night sweats,  Fevers, chills, fatigue, or  lassitude.  HEENT:   No headaches,  Difficulty swallowing,  Tooth/dental problems, or  Sore throat,                No sneezing, itching, ear ache, +nasal congestion, post nasal drip,   CV:  No chest pain,  Orthopnea, PND, swelling in lower extremities, anasarca, dizziness, palpitations, syncope.   GI  No heartburn, indigestion, abdominal pain, nausea, vomiting, diarrhea, change in bowel habits, loss of appetite, bloody stools.   Resp:    No chest wall deformity  Skin: no rash or lesions.  GU: no dysuria, change in color of urine, no urgency or frequency.  No flank pain, no hematuria   MS:  No joint pain or swelling.  No decreased range of motion.  No back pain.  Psych:  No change in mood or affect. No depression or anxiety.  No memory loss.         Objective:   Physical Exam GEN: A/Ox3; pleasant , NAD, well nourished   HEENT:  [place]/AT,  EACs-clear, TMs-wnl, NOSE-clear, THROAT-clear, no lesions, no postnasal drip or exudate noted.  nontender sinus ,   NECK:  Supple w/ fair ROM; no JVD; normal carotid impulses w/o bruits; no thyromegaly or nodules palpated; no lymphadenopathy.  RESP  Few exp wheezing no accessory muscle use, no dullness to percussion  CARD:  RRR, no m/r/g  , no peripheral edema, pulses intact, no cyanosis or clubbing.  GI:   Soft & nt; nml bowel sounds;  no organomegaly or masses detected.  Musco: Warm bil, no deformities or joint swelling noted.   Neuro: alert, no focal deficits noted.    Skin: Warm, no lesions or rashes         Assessment & Plan:

## 2013-03-18 NOTE — Progress Notes (Signed)
Ov reviewed, and agree with plan as outlined.  Asthma flare secondary to medical noncompliance.

## 2013-03-25 ENCOUNTER — Encounter: Payer: Self-pay | Admitting: Internal Medicine

## 2013-03-25 ENCOUNTER — Ambulatory Visit (INDEPENDENT_AMBULATORY_CARE_PROVIDER_SITE_OTHER): Payer: BC Managed Care – PPO | Admitting: Internal Medicine

## 2013-03-25 VITALS — BP 118/76 | HR 80 | Ht 67.75 in | Wt 222.4 lb

## 2013-03-25 DIAGNOSIS — J309 Allergic rhinitis, unspecified: Secondary | ICD-10-CM

## 2013-03-25 DIAGNOSIS — J45998 Other asthma: Secondary | ICD-10-CM

## 2013-03-25 DIAGNOSIS — J302 Other seasonal allergic rhinitis: Secondary | ICD-10-CM

## 2013-03-25 DIAGNOSIS — J45909 Unspecified asthma, uncomplicated: Secondary | ICD-10-CM

## 2013-03-25 NOTE — Patient Instructions (Addendum)
We can start allergy vaccine as discussed. The allergy lab will call you about starting.  You can continue your regular medicines.  Flu vax

## 2013-03-25 NOTE — Assessment & Plan Note (Signed)
Significant skin test reactions emphasize an important atopic component to her rhinitis and asthma. We discussed treatment options. Discussed risk and benefit. She acknowledges understanding rare possibility of dangerous or fatal reaction to allergy shot. Plan-start allergy vaccine and build per protocol, GH

## 2013-03-25 NOTE — Progress Notes (Signed)
1/243/14- 36 yoF never smoker followed by Dr Shelle Iron for asthma and referred now for allergy evaluation. She gives a history of lifelong asthma/wheezing and allergic rhinitis. Environmental allergies and "stress" are important triggers. She denies problems from intolerance to food, unusual reaction to insect stings, or history of hives or atopic dermatitis.  She lives in a house with carpet, no basement, stasis and gas heat, no mold, 3 dogs.  She grew up in a smoking family but never smoked herself.  Allergy profile 03/20/2004-total IgE 1977.5 with an extremely high level for dog; almost all panel allergens were elevated.  IgE 01/04/2006 1877.5  Aspergillus antibody, CF 01/04/2006-< 1:8/ negative Brother has hay fever   10/13/12- 36 yoF never smoker followed by Dr Shelle Iron for asthma and by Dr Maple Hudson for allergy evaluation. FOLLOWS FOR: had increase in allergy flare up since last visit; SOB as well.  Tired, rebounded from last prednisone course-discussed. Afraid of albuterol nebulizer solution because she had heard there was a batch recalled.. Seasonal rhinitis with crusting and drainage from nose. Admits anxious personality, worried about her ability to initiate and maintain sleep. Allergy Profile 07/17/2012-total IgE 1144.9 with elevation of all potential allergens on the panel.  03/25/13- 36 yoF never smoker followed by Dr Shelle Iron for asthma and by Dr Maple Hudson for allergy evaluation rhinitis/ asthma Feels well today. Notes that she wakes repeatedly at night, sometimes snores.  no antihistamines, OTC cough syrups, or OTC sleep aids in past 3 days. Allergy Skin Test- 03/25/13- significant positive for grass weed and tree pollens dust mite, cat Discussed risks benefits and treatment options. She does want to try allergy vaccine.  ROS-see HPI Constitutional:   No-   weight loss, night sweats, fevers, chills, fatigue, lassitude. HEENT:   No-  headaches, difficulty swallowing, +tooth/dental problems,  no-sore throat,       +sneezing, itching, ear ache, nasal congestion, post nasal drip,  CV:  No-   chest pain, orthopnea, PND, swelling in lower extremities, anasarca, dizziness, palpitations Resp: +   shortness of breath with exertion or at rest.              No-   productive cough,  No non-productive cough,  No- coughing up of blood.              No-   change in color of mucus.  No- wheezing.   Skin: No-   rash or lesions. GI:  No-   heartburn, indigestion, abdominal pain, nausea, vomiting, diarrhea,  GU:  MS:  No-   joint pain or swelling.   Neuro-     nothing unusual Psych:  No- change in mood or affect. + depression or anxiety.  No memory loss.  OBJ- Physical Exam General- Alert, Oriented, Affect-appropriate, Distress- none acute Skin- rash-none, lesions- none, excoriation- none Lymphadenopathy- none Head- atraumatic            Eyes- Gross vision intact, PERRLA, conjunctivae and secretions clear            Ears- Hearing, canals-normal            Nose- + mucus, +turbinate edema, no-Septal dev,  polyps, erosion, perforation             Throat- Mallampati II , mucosa clear , drainage- none, tonsils- atrophic Neck- flexible , trachea midline, no stridor , thyroid nl, carotid no bruit Chest - symmetrical excursion , unlabored           Heart/CV- RRR , no murmur ,  no gallop  , no rub, nl s1 s2                           - JVD- none , edema- none, stasis changes- none, varices- none           Lung- clear to P&A, wheeze+ mild, cough- none , dullness-none, rub- none           Chest wall-  Abd-  Br/ Gen/ Rectal- Not done, not indicated Extrem- cyanosis- none, clubbing, none, atrophy- none, strength- nl Neuro- grossly intact to observation

## 2013-04-01 ENCOUNTER — Ambulatory Visit (INDEPENDENT_AMBULATORY_CARE_PROVIDER_SITE_OTHER): Payer: BC Managed Care – PPO

## 2013-04-01 DIAGNOSIS — J309 Allergic rhinitis, unspecified: Secondary | ICD-10-CM

## 2013-04-02 ENCOUNTER — Telehealth: Payer: Self-pay | Admitting: Internal Medicine

## 2013-04-02 ENCOUNTER — Ambulatory Visit (INDEPENDENT_AMBULATORY_CARE_PROVIDER_SITE_OTHER): Payer: BC Managed Care – PPO

## 2013-04-02 DIAGNOSIS — J309 Allergic rhinitis, unspecified: Secondary | ICD-10-CM

## 2013-04-02 MED ORDER — EPINEPHRINE 0.3 MG/0.3ML IJ SOAJ: 0.3000 mg | Freq: Once | INTRAMUSCULAR | Status: DC

## 2013-04-02 NOTE — Telephone Encounter (Signed)
Pt started allergy injections; Epipen sent to pharmacy and patient taught by Dimas Millin in allergy lab.

## 2013-04-06 ENCOUNTER — Ambulatory Visit (INDEPENDENT_AMBULATORY_CARE_PROVIDER_SITE_OTHER): Payer: BC Managed Care – PPO

## 2013-04-06 DIAGNOSIS — J309 Allergic rhinitis, unspecified: Secondary | ICD-10-CM

## 2013-04-09 ENCOUNTER — Ambulatory Visit: Payer: BC Managed Care – PPO

## 2013-04-10 ENCOUNTER — Ambulatory Visit (INDEPENDENT_AMBULATORY_CARE_PROVIDER_SITE_OTHER): Payer: BC Managed Care – PPO

## 2013-04-10 DIAGNOSIS — J309 Allergic rhinitis, unspecified: Secondary | ICD-10-CM

## 2013-04-13 ENCOUNTER — Ambulatory Visit (INDEPENDENT_AMBULATORY_CARE_PROVIDER_SITE_OTHER): Payer: BC Managed Care – PPO

## 2013-04-13 DIAGNOSIS — J309 Allergic rhinitis, unspecified: Secondary | ICD-10-CM

## 2013-04-16 ENCOUNTER — Ambulatory Visit: Payer: BC Managed Care – PPO

## 2013-05-01 ENCOUNTER — Ambulatory Visit (INDEPENDENT_AMBULATORY_CARE_PROVIDER_SITE_OTHER): Payer: Self-pay

## 2013-05-01 DIAGNOSIS — J309 Allergic rhinitis, unspecified: Secondary | ICD-10-CM

## 2013-05-04 ENCOUNTER — Ambulatory Visit (INDEPENDENT_AMBULATORY_CARE_PROVIDER_SITE_OTHER): Payer: Self-pay

## 2013-05-04 ENCOUNTER — Telehealth: Payer: Self-pay | Admitting: Pulmonary Disease

## 2013-05-04 DIAGNOSIS — J309 Allergic rhinitis, unspecified: Secondary | ICD-10-CM

## 2013-05-04 NOTE — Telephone Encounter (Signed)
Pt is aware that we do not have any samples at this time. 

## 2013-05-07 ENCOUNTER — Ambulatory Visit: Payer: BC Managed Care – PPO

## 2013-05-08 ENCOUNTER — Telehealth: Payer: Self-pay | Admitting: Internal Medicine

## 2013-05-08 ENCOUNTER — Ambulatory Visit (INDEPENDENT_AMBULATORY_CARE_PROVIDER_SITE_OTHER): Payer: Self-pay

## 2013-05-08 DIAGNOSIS — J309 Allergic rhinitis, unspecified: Secondary | ICD-10-CM

## 2013-05-08 NOTE — Telephone Encounter (Signed)
Dot Lanes called me; informed her we do not have any samples at this time; she informed patient. Will sign off on message.

## 2013-05-11 ENCOUNTER — Ambulatory Visit: Payer: BC Managed Care – PPO

## 2013-05-12 ENCOUNTER — Ambulatory Visit (INDEPENDENT_AMBULATORY_CARE_PROVIDER_SITE_OTHER): Payer: Self-pay

## 2013-05-12 ENCOUNTER — Encounter: Payer: Self-pay | Admitting: *Deleted

## 2013-05-12 DIAGNOSIS — J309 Allergic rhinitis, unspecified: Secondary | ICD-10-CM

## 2013-05-13 ENCOUNTER — Encounter: Payer: Self-pay | Admitting: Pulmonary Disease

## 2013-05-13 ENCOUNTER — Ambulatory Visit (INDEPENDENT_AMBULATORY_CARE_PROVIDER_SITE_OTHER): Payer: Self-pay | Admitting: Pulmonary Disease

## 2013-05-13 VITALS — BP 124/76 | HR 86 | Temp 98.4°F | Ht 68.0 in | Wt 225.0 lb

## 2013-05-13 DIAGNOSIS — J45901 Unspecified asthma with (acute) exacerbation: Secondary | ICD-10-CM

## 2013-05-13 NOTE — Patient Instructions (Signed)
Will increase advair to the 500/50 strength, one in am and pm for next few weeks to see if things improve.  Rinse mouth very well. Instead of benedryl, take chlorpheniramine 4mg  and take 2 each night at bedtime and one at lunch. If you continue to have nasal symptoms, may try a nasal spray to help Use neilmed sinus rinses am and pm for a few weeks, then as needed when you have congestion. Stay on your allergy vaccine. Keep followup with me in Feb., and let me know if you do better with higher strength advair so that we can call in prescription.

## 2013-05-13 NOTE — Progress Notes (Signed)
  Subjective:    Patient ID: Crystal Sampson, female    DOB: 1976-06-18, 37 y.o.   MRN: [id number]  HPI Patient comes in today for an acute sick visit.  She is having worsening rhinorrhea and congestion despite being on allergy vaccine, but is not taking an antihistamine on a routine basis.  She has had some mildly discolored mucus from her nose, but it has not been consistent.  She feels that her breathing is not doing as well as baseline, and she is having to use her rescue inhaler more frequently.   Review of Systems  Constitutional: Negative for fever and unexpected weight change.  HENT: Positive for congestion ( chest and head -- green mucus at times), postnasal drip, rhinorrhea and sinus pressure. Negative for dental problem, ear pain, nosebleeds, sneezing, sore throat and trouble swallowing.   Eyes: Negative for redness and itching.  Respiratory: Positive for shortness of breath. Negative for cough, chest tightness and wheezing.   Cardiovascular: Negative for palpitations and leg swelling.  Gastrointestinal: Negative for nausea and vomiting.  Genitourinary: Negative for dysuria.  Musculoskeletal: Negative for joint swelling.  Skin: Negative for rash.  Neurological: Positive for headaches.  Hematological: Does not bruise/bleed easily.  Psychiatric/Behavioral: Negative for dysphoric mood. The patient is not nervous/anxious.        Objective:   Physical Exam Overweight female in no acute distress Nose with erythematous mucosa but no purulence Neck without lymphadenopathy or thyromegaly Chest totally clear to auscultation with no wheezing Cardiac exam with regular rate and rhythm Lower extremities without edema, no cyanosis Alert and oriented, moves all 4 extremities.       Assessment & Plan:

## 2013-05-13 NOTE — Assessment & Plan Note (Signed)
I suspect the patient is having a very mild asthma flares that is related to fall allergies and also the cold air.  I have asked her to continue on her allergy vaccine, and will also suggest a more aggressive antihistamine on a regular basis.  She may need a topical nasal steroid.  We'll also increase the strength of her Advair to see if this will help with airway inflammation.  Finally, I have stressed to her the importance of wearing a scarf when it is cold outside so that the air is warmed as she breathes it.

## 2013-05-15 ENCOUNTER — Ambulatory Visit: Payer: BC Managed Care – PPO

## 2013-05-29 ENCOUNTER — Ambulatory Visit (INDEPENDENT_AMBULATORY_CARE_PROVIDER_SITE_OTHER): Payer: Self-pay

## 2013-05-29 DIAGNOSIS — J309 Allergic rhinitis, unspecified: Secondary | ICD-10-CM

## 2013-06-03 ENCOUNTER — Ambulatory Visit (INDEPENDENT_AMBULATORY_CARE_PROVIDER_SITE_OTHER): Payer: Self-pay

## 2013-06-03 DIAGNOSIS — J309 Allergic rhinitis, unspecified: Secondary | ICD-10-CM

## 2013-06-05 ENCOUNTER — Ambulatory Visit (INDEPENDENT_AMBULATORY_CARE_PROVIDER_SITE_OTHER): Payer: Self-pay

## 2013-06-05 DIAGNOSIS — J309 Allergic rhinitis, unspecified: Secondary | ICD-10-CM

## 2013-06-23 ENCOUNTER — Ambulatory Visit (INDEPENDENT_AMBULATORY_CARE_PROVIDER_SITE_OTHER): Payer: Self-pay

## 2013-06-23 DIAGNOSIS — J309 Allergic rhinitis, unspecified: Secondary | ICD-10-CM

## 2013-07-31 ENCOUNTER — Ambulatory Visit (INDEPENDENT_AMBULATORY_CARE_PROVIDER_SITE_OTHER): Payer: Self-pay

## 2013-07-31 DIAGNOSIS — J309 Allergic rhinitis, unspecified: Secondary | ICD-10-CM

## 2013-08-07 ENCOUNTER — Ambulatory Visit: Payer: Self-pay

## 2013-08-12 ENCOUNTER — Ambulatory Visit: Payer: BC Managed Care – PPO | Admitting: Pulmonary Disease

## 2013-08-17 ENCOUNTER — Telehealth: Payer: Self-pay | Admitting: Internal Medicine

## 2013-08-21 NOTE — Telephone Encounter (Signed)
KC sees patient for asthma.

## 2013-08-25 NOTE — Telephone Encounter (Signed)
Dr Shelle Ironlance, are you okay with filling this patients Albuterol Neb? Message was sent to you to handle seeing that you follow patient for Asthma. Dr Maple HudsonYoung is the original prescriber of this medication and you have never filled this--I do not see anywhere in the patients medication history that you have rxd this for her.   Last filled by Dr Maple HudsonYoung 07/17/12 x 12 refills

## 2013-08-25 NOTE — Telephone Encounter (Signed)
Ok to fill, but one box with only 2 fills.   I am concerned that she overuses albuterol.

## 2013-08-26 NOTE — Telephone Encounter (Signed)
Rx sent to pharmacy.  Nothing further needed.  

## 2013-08-28 ENCOUNTER — Ambulatory Visit (INDEPENDENT_AMBULATORY_CARE_PROVIDER_SITE_OTHER): Payer: Self-pay | Admitting: Pulmonary Disease

## 2013-08-28 ENCOUNTER — Encounter: Payer: Self-pay | Admitting: Pulmonary Disease

## 2013-08-28 ENCOUNTER — Ambulatory Visit (INDEPENDENT_AMBULATORY_CARE_PROVIDER_SITE_OTHER): Payer: Self-pay

## 2013-08-28 VITALS — BP 124/80 | HR 94 | Temp 98.0°F | Ht 68.0 in | Wt 229.2 lb

## 2013-08-28 DIAGNOSIS — J45909 Unspecified asthma, uncomplicated: Secondary | ICD-10-CM

## 2013-08-28 DIAGNOSIS — J309 Allergic rhinitis, unspecified: Secondary | ICD-10-CM

## 2013-08-28 MED ORDER — FLUTICASONE-SALMETEROL 500-50 MCG/DOSE IN AEPB: 1.0000 | INHALATION_SPRAY | Freq: Two times a day (BID) | RESPIRATORY_TRACT | Status: DC

## 2013-08-28 NOTE — Assessment & Plan Note (Signed)
The patient overall is doing fairly well from an asthma standpoint, but felt that she did better on the higher dose of Advair. I've asked her to stay on the higher dose, and to continue with her allergy vaccine. She is to continue her nasal hygiene regimen as well.

## 2013-08-28 NOTE — Progress Notes (Signed)
   Subjective:    Patient ID: Crystal Sampson, female    DOB: 07/24/1975, 38 y.o.   MRN: [id number]  HPI The patient comes in today for followup of her known severe asthma with allergic component. At the last visit, I increased her Advair to the 500 strength, and the patient saw significant improvement in her breathing and symptom control. However, she never called for a prescription. She currently is doing okay on the 250 strength, but feels she did better on the higher strength. She denies any significant cough or mucus production. She does have some dyspnea on exertion, but I explained that a lot of this is related to her weight and deconditioning.   Review of Systems  Constitutional: Positive for unexpected weight change. Negative for fever.  HENT: Positive for dental problem. Negative for congestion, ear pain, nosebleeds, postnasal drip, rhinorrhea, sinus pressure, sneezing, sore throat and trouble swallowing.   Eyes: Positive for redness. Negative for itching.  Respiratory: Negative for cough, chest tightness, shortness of breath and wheezing.   Cardiovascular: Positive for leg swelling. Negative for palpitations.  Gastrointestinal: Negative for nausea and vomiting.  Genitourinary: Negative for dysuria.  Musculoskeletal: Positive for joint swelling.  Skin: Negative for rash.  Neurological: Negative for headaches.  Hematological: Does not bruise/bleed easily.  Psychiatric/Behavioral: Negative for dysphoric mood. The patient is nervous/anxious.        Objective:   Physical Exam Overweight female in no acute distress Nose without purulence or discharge noted Neck without lymphadenopathy or thyromegaly Chest with totally clear breath sounds, no wheezing Cardiac exam with regular rate and rhythm Lower extremities without edema, no cyanosis Alert and oriented, moves all 4 extremities.       Assessment & Plan:

## 2013-08-28 NOTE — Addendum Note (Signed)
Addended by: Maisie FusGREEN, Saksham Akkerman M on: 08/28/2013 05:00 PM   Modules accepted: Orders

## 2013-08-28 NOTE — Patient Instructions (Signed)
Will go back to advair 500/50 since you did better on this.  Keep mouth rinsed well. Stay on your allergy vaccine. No change in other medications for nasal symptoms. Work on weight loss and conditioning. followup with me again in 6mos.

## 2013-10-05 ENCOUNTER — Ambulatory Visit (INDEPENDENT_AMBULATORY_CARE_PROVIDER_SITE_OTHER): Payer: Self-pay

## 2013-10-05 DIAGNOSIS — J309 Allergic rhinitis, unspecified: Secondary | ICD-10-CM

## 2013-10-16 ENCOUNTER — Ambulatory Visit (INDEPENDENT_AMBULATORY_CARE_PROVIDER_SITE_OTHER): Payer: Self-pay

## 2013-10-16 DIAGNOSIS — J309 Allergic rhinitis, unspecified: Secondary | ICD-10-CM

## 2013-10-30 ENCOUNTER — Ambulatory Visit (INDEPENDENT_AMBULATORY_CARE_PROVIDER_SITE_OTHER): Payer: Self-pay

## 2013-10-30 DIAGNOSIS — J309 Allergic rhinitis, unspecified: Secondary | ICD-10-CM

## 2013-11-13 ENCOUNTER — Ambulatory Visit (INDEPENDENT_AMBULATORY_CARE_PROVIDER_SITE_OTHER): Payer: Self-pay

## 2013-11-13 DIAGNOSIS — J309 Allergic rhinitis, unspecified: Secondary | ICD-10-CM

## 2013-11-24 ENCOUNTER — Encounter: Payer: Self-pay | Admitting: Internal Medicine

## 2013-12-01 ENCOUNTER — Encounter (HOSPITAL_COMMUNITY): Payer: Self-pay | Admitting: Emergency Medicine

## 2013-12-01 ENCOUNTER — Emergency Department (HOSPITAL_COMMUNITY): Payer: No Typology Code available for payment source

## 2013-12-01 ENCOUNTER — Emergency Department (HOSPITAL_COMMUNITY)
Admission: EM | Admit: 2013-12-01 | Discharge: 2013-12-02 | Disposition: A | Discharge status: Home / Self Care | Payer: No Typology Code available for payment source | Attending: Emergency Medicine | Admitting: Emergency Medicine

## 2013-12-01 DIAGNOSIS — IMO0002 Reserved for concepts with insufficient information to code with codable children: Secondary | ICD-10-CM | POA: Insufficient documentation

## 2013-12-01 DIAGNOSIS — J45901 Unspecified asthma with (acute) exacerbation: Secondary | ICD-10-CM

## 2013-12-01 DIAGNOSIS — Y92410 Unspecified street and highway as the place of occurrence of the external cause: Secondary | ICD-10-CM | POA: Insufficient documentation

## 2013-12-01 DIAGNOSIS — Z8659 Personal history of other mental and behavioral disorders: Secondary | ICD-10-CM | POA: Insufficient documentation

## 2013-12-01 DIAGNOSIS — S139XXA Sprain of joints and ligaments of unspecified parts of neck, initial encounter: Secondary | ICD-10-CM | POA: Insufficient documentation

## 2013-12-01 DIAGNOSIS — Z79899 Other long term (current) drug therapy: Secondary | ICD-10-CM | POA: Insufficient documentation

## 2013-12-01 DIAGNOSIS — R110 Nausea: Secondary | ICD-10-CM | POA: Insufficient documentation

## 2013-12-01 DIAGNOSIS — Z8619 Personal history of other infectious and parasitic diseases: Secondary | ICD-10-CM | POA: Insufficient documentation

## 2013-12-01 DIAGNOSIS — Z880 Allergy status to penicillin: Secondary | ICD-10-CM | POA: Insufficient documentation

## 2013-12-01 DIAGNOSIS — S161XXA Strain of muscle, fascia and tendon at neck level, initial encounter: Secondary | ICD-10-CM

## 2013-12-01 DIAGNOSIS — Y9389 Activity, other specified: Secondary | ICD-10-CM | POA: Insufficient documentation

## 2013-12-01 MED ORDER — IPRATROPIUM-ALBUTEROL 0.5-2.5 (3) MG/3ML IN SOLN
3.0000 mL | Freq: Once | RESPIRATORY_TRACT | Status: AC
  Administered 2013-12-01: 3 mL via RESPIRATORY_TRACT
  Filled 2013-12-01: qty 3

## 2013-12-01 MED ORDER — PREDNISONE 20 MG PO TABS: 40.0000 mg | ORAL_TABLET | Freq: Every day | ORAL | Status: DC

## 2013-12-01 MED ORDER — PREDNISONE 20 MG PO TABS
60.0000 mg | ORAL_TABLET | Freq: Once | ORAL | Status: AC
  Administered 2013-12-01: 60 mg via ORAL
  Filled 2013-12-01: qty 3

## 2013-12-01 MED ORDER — CYCLOBENZAPRINE HCL 10 MG PO TABS
10.0000 mg | ORAL_TABLET | Freq: Two times a day (BID) | ORAL | Status: DC | PRN
Start: 2013-12-01 — End: 2014-12-14
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
## 2013-12-01 NOTE — Discharge Instructions (Signed)
Do not hesitate to return to the Emergency Department for any new, worsening or concerning symptoms.   If you do not have a primary care doctor you can establish one at the   [facility]: [address] [place] [address] [phone]  After you establish care. Let them know you were seen in the emergency room. They must obtain records for further management.    Asthma, Adult Asthma is a recurring condition in which the airways tighten and narrow. Asthma can make it difficult to breathe. It can cause coughing, wheezing, and shortness of breath. Asthma episodes (also called asthma attacks) range from minor to life-threatening. Asthma cannot be cured, but medicines and lifestyle changes can help control it. CAUSES Asthma is believed to be caused by inherited (genetic) and environmental factors, but its exact cause is unknown. Asthma may be triggered by allergens, lung infections, or irritants in the air. Asthma triggers are different for each person. Common triggers include:   Animal dander.  Dust mites.  Cockroaches.  Pollen from trees or grass.  Mold.  Smoke.  Air pollutants such as dust, household cleaners, hair sprays, aerosol sprays, paint fumes, strong chemicals, or strong odors.  Cold air, weather changes, and winds (which increase molds and pollens in the air).  Strong emotional expressions such as crying or laughing hard.  Stress.  Certain medicines (such as aspirin) or types of drugs (such as beta-blockers).  Sulfites in foods and drinks. Foods and drinks that may contain sulfites include dried fruit, potato chips, and sparkling grape juice.  Infections or inflammatory conditions such as the flu, a cold, or an inflammation of the nasal membranes (rhinitis).  Gastroesophageal reflux disease (GERD).  Exercise or strenuous activity. SYMPTOMS Symptoms may occur immediately after asthma is triggered or many hours later. Symptoms  include:  Wheezing.  Excessive nighttime or early morning coughing.  Frequent or severe coughing with a common cold.  Chest tightness.  Shortness of breath. DIAGNOSIS  The diagnosis of asthma is made by a review of your medical history and a physical exam. Tests may also be performed. These may include:  Lung function studies. These tests show how much air you breath in and out.  Allergy tests.  Imaging tests such as X-rays. TREATMENT  Asthma cannot be cured, but it can usually be controlled. Treatment involves identifying and avoiding your asthma triggers. It also involves medicines. There are 2 classes of medicine used for asthma treatment:   Controller medicines. These prevent asthma symptoms from occurring. They are usually taken every day.  Reliever or rescue medicines. These quickly relieve asthma symptoms. They are used as needed and provide short-term relief. Your health care provider will help you create an asthma action plan. An asthma action plan is a written plan for managing and treating your asthma attacks. It includes a list of your asthma triggers and how they may be avoided. It also includes information on when medicines should be taken and when their dosage should be changed. An action plan may also involve the use of a device called a peak flow meter. A peak flow meter measures how well the lungs are working. It helps you monitor your condition. HOME CARE INSTRUCTIONS   Take medicine as directed by your health care provider. Speak with your health care provider if you have questions about how or when to take the medicines.  Use a peak flow meter as directed by your health care provider. Record and keep track of readings.  Understand and use the action plan to help minimize or stop an asthma attack without needing to seek medical care.  Control your home environment in the following ways to help prevent asthma attacks:  Do not smoke. Avoid being exposed to  secondhand smoke.  Change your heating and air conditioning filter regularly.  Limit your use of fireplaces and wood stoves.  Get rid of pests (such as roaches and mice) and their droppings.  Throw away plants if you see mold on them.  Clean your floors and dust regularly. Use unscented cleaning products.  Try to have someone else vacuum for you regularly. Stay out of rooms while they are being vacuumed and for a short while afterward. If you vacuum, use a dust mask from a hardware store, a double-layered or microfilter vacuum cleaner bag, or a vacuum cleaner with a HEPA filter.  Replace carpet with wood, tile, or vinyl flooring. Carpet can trap dander and dust.  Use allergy-proof pillows, mattress covers, and box spring covers.  Wash bed sheets and blankets every week in hot water and dry them in a dryer.  Use blankets that are made of polyester or cotton.  Clean bathrooms and kitchens with bleach. If possible, have someone repaint the walls in these rooms with mold-resistant paint. Keep out of the rooms that are being cleaned and painted.  Wash hands frequently. SEEK MEDICAL CARE IF:   You have wheezing, shortness of breath, or a cough even if taking medicine to prevent attacks.  The colored mucus you cough up (sputum) is thicker than usual.  Your sputum changes from clear or white to yellow, green, gray, or bloody.  You have any problems that may be related to the medicines you are taking (such as a rash, itching, swelling, or trouble breathing).  You are using a reliever medicine more than 2 3 times per week.  Your peak flow is still at 50 79% of you personal best after following your action plan for 1 hour. SEEK IMMEDIATE MEDICAL CARE IF:   You seem to be getting worse and are unresponsive to treatment during an asthma attack.  You are short of breath even at rest.  You get short of breath when doing very little physical activity.  You have difficulty eating,  drinking, or talking due to asthma symptoms.  You develop chest pain.  You develop a fast heartbeat.  You have a bluish color to your lips or fingernails.  You are lightheaded, dizzy, or faint.  Your peak flow is less than 50% of your personal best.  You have a fever or persistent symptoms for more than 2 3 days.  You have a fever and symptoms suddenly get worse. MAKE SURE YOU:   Understand these instructions.  Will watch your condition.  Will get help right away if you are not doing well or get worse. Document Released: 06/11/2005 Document Revised: 02/11/2013 Document Reviewed: 01/08/2013 [facility] Patient Information 2014 [employer] [place], [place].

## 2013-12-02 NOTE — ED Provider Notes (Signed)
Medical screening examination/treatment/procedure(s) were performed by non-physician practitioner and as supervising physician I was immediately available for consultation/collaboration.   EKG Interpretation None        Audree Camel, MD 12/02/13 1625

## 2013-12-02 NOTE — Progress Notes (Signed)
Quick Note:  Not seen by TP since 03/17/13 Pt seen in ED on 12/01/13 - will forward results back to ordering provider [facility] PA-C ______

## 2013-12-02 NOTE — ED Notes (Signed)
Declined W/C at D/C and was escorted to lobby by RN. 

## 2013-12-04 ENCOUNTER — Ambulatory Visit (INDEPENDENT_AMBULATORY_CARE_PROVIDER_SITE_OTHER): Payer: Self-pay

## 2013-12-04 DIAGNOSIS — J309 Allergic rhinitis, unspecified: Secondary | ICD-10-CM

## 2013-12-18 ENCOUNTER — Ambulatory Visit (INDEPENDENT_AMBULATORY_CARE_PROVIDER_SITE_OTHER): Payer: Self-pay

## 2013-12-18 DIAGNOSIS — J309 Allergic rhinitis, unspecified: Secondary | ICD-10-CM

## 2013-12-28 ENCOUNTER — Telehealth: Payer: Self-pay | Admitting: Pulmonary Disease

## 2013-12-28 ENCOUNTER — Ambulatory Visit (INDEPENDENT_AMBULATORY_CARE_PROVIDER_SITE_OTHER): Payer: Self-pay

## 2013-12-28 DIAGNOSIS — J309 Allergic rhinitis, unspecified: Secondary | ICD-10-CM

## 2013-12-28 NOTE — Telephone Encounter (Signed)
[facility]  Offer the forms to pt assistance to help pt with her medication for advair.  These [facility] and she will give these to the pt.

## 2014-01-01 ENCOUNTER — Telehealth: Payer: Self-pay | Admitting: *Deleted

## 2014-01-01 MED ORDER — FLUTICASONE-SALMETEROL 500-50 MCG/DOSE IN AEPB: 1.0000 | INHALATION_SPRAY | Freq: Two times a day (BID) | RESPIRATORY_TRACT | Status: DC

## 2014-01-01 NOTE — Telephone Encounter (Signed)
Called and spoke with pt and she is aware of KC recs that she may have 1 sample and this has been left up front for the pt. She will come by on Monday to pick this up.

## 2014-01-01 NOTE — Telephone Encounter (Signed)
Spoke with pt. She has misplaced her advair. She is asking for a sample to hold her through until she can hear back from PA for advair. She is on advair 500-50 mcg. Please advise KC thanks

## 2014-01-01 NOTE — Telephone Encounter (Signed)
She can have one box.

## 2014-01-08 ENCOUNTER — Telehealth: Payer: Self-pay | Admitting: Pulmonary Disease

## 2014-01-11 MED ORDER — FLUTICASONE-SALMETEROL 500-50 MCG/DOSE IN AEPB: 1.0000 | INHALATION_SPRAY | Freq: Two times a day (BID) | RESPIRATORY_TRACT | Status: DC

## 2014-01-11 NOTE — Telephone Encounter (Signed)
rx printed and faxed with forms to [facility]. Crystal CurieJennifer Django Sampson, CMA

## 2014-01-18 ENCOUNTER — Other Ambulatory Visit: Payer: Self-pay | Admitting: Pulmonary Disease

## 2014-03-05 ENCOUNTER — Ambulatory Visit: Payer: Self-pay | Admitting: Pulmonary Disease

## 2014-04-26 ENCOUNTER — Encounter (HOSPITAL_COMMUNITY): Payer: Self-pay | Admitting: Emergency Medicine

## 2014-04-30 ENCOUNTER — Encounter: Payer: Self-pay | Admitting: Internal Medicine

## 2014-05-16 ENCOUNTER — Telehealth: Payer: Self-pay | Admitting: Pulmonary Disease

## 2014-05-17 NOTE — Telephone Encounter (Signed)
Refilled medication.  LMTCB X1 for patient.

## 2014-05-17 NOTE — Telephone Encounter (Signed)
received refill request for pt albuterol nebulizer. [facility] by Larina BrasMeghan Lawerence. Did not see any prior refills except from Dr. Maple HudsonYoung in 2014. Dr. Shelle Ironlance please advise if okay to refill thanks

## 2014-05-17 NOTE — Telephone Encounter (Signed)
I think Dr. Maple HudsonYoung started her on this, but I am happy to fill. Make sure she know this is for EMERGENCIES only, and if she is using every week (even one treatment weekly), then we need to know.  Her albuterol inhaler should be used first unless it is an emergency.  If she is using albuterol inhaler more than 2 times a week, this is considered poor control.

## 2014-09-14 ENCOUNTER — Encounter (HOSPITAL_BASED_OUTPATIENT_CLINIC_OR_DEPARTMENT_OTHER): Payer: Self-pay | Admitting: *Deleted

## 2014-09-14 ENCOUNTER — Emergency Department (HOSPITAL_BASED_OUTPATIENT_CLINIC_OR_DEPARTMENT_OTHER)
Admission: EM | Admit: 2014-09-14 | Discharge: 2014-09-14 | Disposition: A | Discharge status: Home / Self Care | Payer: MEDICAID | Attending: Emergency Medicine | Admitting: Emergency Medicine

## 2014-09-14 DIAGNOSIS — J45909 Unspecified asthma, uncomplicated: Secondary | ICD-10-CM | POA: Diagnosis not present

## 2014-09-14 DIAGNOSIS — Z7952 Long term (current) use of systemic steroids: Secondary | ICD-10-CM | POA: Diagnosis not present

## 2014-09-14 DIAGNOSIS — Z880 Allergy status to penicillin: Secondary | ICD-10-CM | POA: Insufficient documentation

## 2014-09-14 DIAGNOSIS — M791 Myalgia: Secondary | ICD-10-CM | POA: Insufficient documentation

## 2014-09-14 DIAGNOSIS — Z79899 Other long term (current) drug therapy: Secondary | ICD-10-CM | POA: Insufficient documentation

## 2014-09-14 DIAGNOSIS — Z7951 Long term (current) use of inhaled steroids: Secondary | ICD-10-CM | POA: Insufficient documentation

## 2014-09-14 DIAGNOSIS — F329 Major depressive disorder, single episode, unspecified: Secondary | ICD-10-CM | POA: Diagnosis not present

## 2014-09-14 DIAGNOSIS — F32A Depression, unspecified: Secondary | ICD-10-CM

## 2014-09-14 DIAGNOSIS — R197 Diarrhea, unspecified: Secondary | ICD-10-CM | POA: Diagnosis not present

## 2014-09-14 DIAGNOSIS — Z8619 Personal history of other infectious and parasitic diseases: Secondary | ICD-10-CM | POA: Insufficient documentation

## 2014-09-14 DIAGNOSIS — R112 Nausea with vomiting, unspecified: Secondary | ICD-10-CM | POA: Diagnosis not present

## 2014-09-14 DIAGNOSIS — K5900 Constipation, unspecified: Secondary | ICD-10-CM | POA: Insufficient documentation

## 2014-09-14 DIAGNOSIS — G479 Sleep disorder, unspecified: Secondary | ICD-10-CM | POA: Insufficient documentation

## 2014-09-14 LAB — URINE MICROSCOPIC-ADD ON

## 2014-09-14 LAB — URINALYSIS, ROUTINE W REFLEX MICROSCOPIC
BILIRUBIN URINE: NEGATIVE
Glucose, UA: NEGATIVE mg/dL
Ketones, ur: NEGATIVE mg/dL
Leukocytes, UA: NEGATIVE
Nitrite: NEGATIVE
Protein, ur: NEGATIVE mg/dL
SPECIFIC GRAVITY, URINE: 1.021 (ref 1.005–1.030)
UROBILINOGEN UA: 0.2 mg/dL (ref 0.0–1.0)
pH: 5.5 (ref 5.0–8.0)

## 2014-09-14 LAB — RAPID URINE DRUG SCREEN, HOSP PERFORMED
AMPHETAMINES: NOT DETECTED
BARBITURATES: NOT DETECTED
BENZODIAZEPINES: NOT DETECTED
COCAINE: NOT DETECTED
Opiates: NOT DETECTED
Tetrahydrocannabinol: NOT DETECTED

## 2014-09-14 MED ORDER — ONDANSETRON 4 MG PO TBDP: ORAL_TABLET | ORAL | Status: DC

## 2014-09-14 NOTE — Discharge Instructions (Signed)
Depression °Depression refers to feeling sad, low, down in the dumps, blue, gloomy, or empty. In general, there are two kinds of depression: °· Normal sadness or normal grief. This kind of depression is one that we all feel from time to time after upsetting life experiences, such as the loss of a job or the ending of a relationship. This kind of depression is considered normal, is short lived, and resolves within a few days to 2 weeks. Depression experienced after the loss of a loved one (bereavement) often lasts longer than 2 weeks but normally gets better with time. °· Clinical depression. This kind of depression lasts longer than normal sadness or normal grief or interferes with your ability to function at home, at work, and in school. It also interferes with your personal relationships. It affects almost every aspect of your life. Clinical depression is an illness. °Symptoms of depression can also be caused by conditions other than those mentioned above, such as: °· Physical illness. Some physical illnesses, including underactive thyroid gland (hypothyroidism), severe anemia, specific types of cancer, diabetes, uncontrolled seizures, heart and lung problems, strokes, and chronic pain are commonly associated with symptoms of depression. °· Side effects of some prescription medicine. In some people, certain types of medicine can cause symptoms of depression. °· Substance abuse. Abuse of alcohol and illicit drugs can cause symptoms of depression. °SYMPTOMS °Symptoms of normal sadness and normal grief include the following: °· Feeling sad or crying for short periods of time. °· Not caring about anything (apathy). °· Difficulty sleeping or sleeping too much. °· No longer able to enjoy the things you used to enjoy. °· Desire to be by oneself all the time (social isolation). °· Lack of energy or motivation. °· Difficulty concentrating or remembering. °· Change in appetite or weight. °· Restlessness or  agitation. °Symptoms of clinical depression include the same symptoms of normal sadness or normal grief and also the following symptoms: °· Feeling sad or crying all the time. °· Feelings of guilt or worthlessness. °· Feelings of hopelessness or helplessness. °· Thoughts of suicide or the desire to harm yourself (suicidal ideation). °· Loss of touch with reality (psychotic symptoms). Seeing or hearing things that are not real (hallucinations) or having false beliefs about your life or the people around you (delusions and paranoia). °DIAGNOSIS  °The diagnosis of clinical depression is usually based on how bad the symptoms are and how long they have lasted. Your health care provider will also ask you questions about your medical history and substance use to find out if physical illness, use of prescription medicine, or substance abuse is causing your depression. Your health care provider may also order blood tests. °TREATMENT  °Often, normal sadness and normal grief do not require treatment. However, sometimes antidepressant medicine is given for bereavement to ease the depressive symptoms until they resolve. °The treatment for clinical depression depends on how bad the symptoms are but often includes antidepressant medicine, counseling with a mental health professional, or both. Your health care provider will help to determine what treatment is best for you. °Depression caused by physical illness usually goes away with appropriate medical treatment of the illness. If prescription medicine is causing depression, talk with your health care provider about stopping the medicine, decreasing the dose, or changing to another medicine. °Depression caused by the abuse of alcohol or illicit drugs goes away when you stop using these substances. Some adults need professional help in order to stop drinking or using drugs. °SEEK IMMEDIATE MEDICAL   CARE IF:  You have thoughts about hurting yourself or others.  You lose touch  with reality (have psychotic symptoms).  You are taking medicine for depression and have a serious side effect. FOR MORE INFORMATION  National Alliance on Mental Illness: www.nami.[employer] of Mental Health: http://www.maynard.net/ Document Released: 06/08/2000 Document Revised: 10/26/2013 Document Reviewed: 09/10/2011 [facility] Patient Information 2015 [place], [place]. This information is not intended to replace advice given to you by your health care provider. Make sure you discuss any questions you have with your health care provider. Viral Gastroenteritis Viral gastroenteritis is also known as stomach flu. This condition affects the stomach and intestinal tract. It can cause sudden diarrhea and vomiting. The illness typically lasts 3 to 8 days. Most people develop an immune response that eventually gets rid of the virus. While this natural response develops, the virus can make you quite ill. CAUSES  Many different viruses can cause gastroenteritis, such as rotavirus or noroviruses. You can catch one of these viruses by consuming contaminated food or water. You may also catch a virus by sharing utensils or other personal items with an infected person or by touching a contaminated surface. SYMPTOMS  The most common symptoms are diarrhea and vomiting. These problems can cause a severe loss of body fluids (dehydration) and a body salt (electrolyte) imbalance. Other symptoms may include:  Fever.  Headache.  Fatigue.  Abdominal pain. DIAGNOSIS  Your caregiver can usually diagnose viral gastroenteritis based on your symptoms and a physical exam. A stool sample may also be taken to test for the presence of viruses or other infections. TREATMENT  This illness typically goes away on its own. Treatments are aimed at rehydration. The most serious cases of viral gastroenteritis involve vomiting so severely that you are not able to keep fluids down. In these cases, fluids must be given  through an intravenous line (IV). HOME CARE INSTRUCTIONS   Drink enough fluids to keep your urine clear or pale yellow. Drink small amounts of fluids frequently and increase the amounts as tolerated.  Ask your caregiver for specific rehydration instructions.  Avoid:  Foods high in sugar.  Alcohol.  Carbonated drinks.  Tobacco.  Juice.  Caffeine drinks.  Extremely hot or cold fluids.  Fatty, greasy foods.  Too much intake of anything at one time.  Dairy products until 24 to 48 hours after diarrhea stops.  You may consume probiotics. Probiotics are active cultures of beneficial bacteria. They may lessen the amount and number of diarrheal stools in adults. Probiotics can be found in yogurt with active cultures and in supplements.  Wash your hands well to avoid spreading the virus.  Only take over-the-counter or prescription medicines for pain, discomfort, or fever as directed by your caregiver. Do not give aspirin to children. Antidiarrheal medicines are not recommended.  Ask your caregiver if you should continue to take your regular prescribed and over-the-counter medicines.  Keep all follow-up appointments as directed by your caregiver. SEEK IMMEDIATE MEDICAL CARE IF:   You are unable to keep fluids down.  You do not urinate at least once every 6 to 8 hours.  You develop shortness of breath.  You notice blood in your stool or vomit. This may look like coffee grounds.  You have abdominal pain that increases or is concentrated in one small area (localized).  You have persistent vomiting or diarrhea.  You have a fever.  The patient is a child younger than 3 months, and he or she has a  fever.  The patient is a child older than 3 months, and he or she has a fever and persistent symptoms.  The patient is a child older than 3 months, and he or she has a fever and symptoms suddenly get worse.  The patient is a baby, and he or she has no tears when crying. MAKE SURE  YOU:   Understand these instructions.  Will watch your condition.  Will get help right away if you are not doing well or get worse. Document Released: 06/11/2005 Document Revised: 09/03/2011 Document Reviewed: 03/28/2011 [facility] Patient Information 2015 [place], [place]. This information is not intended to replace advice given to you by your health care provider. Make sure you discuss any questions you have with your health care provider.   Emergency Department Resource Guide 1) Find a Doctor and Pay Out of Pocket Although you won't have to find out who is covered by your insurance plan, it is a good idea to ask around and get recommendations. You will then need to call the office and see if the doctor you have chosen will accept you as a new patient and what types of options they offer for patients who are self-pay. Some doctors offer discounts or will set up payment plans for their patients who do not have insurance, but you will need to ask so you aren't surprised when you get to your appointment.  2) Contact Your Local Health Department Not all health departments have doctors that can see patients for sick visits, but many do, so it is worth a call to see if yours does. If you don't know where your local health department is, you can check in your phone book. The CDC also has a tool to help you locate your state's health department, and many state websites also have listings of all of their local health departments.  3) Find a Walk-in Clinic If your illness is not likely to be very severe or complicated, you may want to try a walk in clinic. These are popping up all over the country in pharmacies, drugstores, and shopping centers. They're usually staffed by nurse practitioners or physician assistants that have been trained to treat common illnesses and complaints. They're usually fairly quick and inexpensive. However, if you have serious medical issues or chronic medical problems, these are  probably not your best option.  No Primary Care Doctor: - Call Health Connect at  [phone] - they can help you locate a primary care doctor that  accepts your insurance, provides certain services, etc. - Physician Referral Service- [phone]  Chronic Pain Problems: Organization         Address  Phone   Notes  [facility]  [phone] Patients need to be referred by their primary care doctor.   Medication Assistance: Organization         Address  Phone   Notes  [facility] [address] [place], [place] [address] [phone] --Must be a resident of [facility] -- Must have NO insurance coverage whatsoever (no Medicaid/ Medicare, etc.) -- The pt. MUST have a primary care doctor that directs their care regularly and follows them in the community   [facility]ist  [phone]   [employer]  [phone]    Agencies that provide inexpensive medical care: Organization         Address  Phone   Notes  [facility]  [phone]   Patrcia Dolly  [facility]    [phone]   [facility] [address] [place], [place] [address] [phone]   [facility] [address] [place], [place] [phone]   Planned Parenthood    [phone]   [facility]    [phone]   [facility]  [address], [place] Phone:  [phone], Fax:  [phone] Hours of Operation:  9 am - 6 pm, M-F.  Also accepts Medicaid/Medicare and self-pay.  [facility]  [address], [place] Phone: [phone], Fax: [phone]. Hours of Operation:  8:30 am - 5:30 pm, M-F.  Also accepts Medicaid and self-pay.  [facility] [place] [address], [place] Phone: [phone]   [facility] [address] Natasha Bence [place], [place] [phone], Ext. [phone] Mondays & Thursdays:  7-9 AM.  First 15 patients are seen on a first come, first serve basis.    Medicaid-accepting [facility] Providers:  Organization         Address  Phone   Notes  [facility] [address],  [phone] Also accepts self-pay patients.  [facility] [address] Laurell Josephs [place], [place]  [phone]   [facility] [address], [place] [phone]   [facility] [address], [place] [phone]   Renaye Rakers [address], [place]   [phone] Only accepts [place] patients after they have their name applied to their card.   Self-Pay (no insurance) in [facility]  Organization         Address  Phone   Notes  Sickle Cell Patients, [facility] [address] [place], [place] [phone]   [facility] [address] [place], [place] [phone]   [facility] [place]  [address], [place] [phone]   [facility]/Dr. Osei-Bonsu  [address], [place] or [address], [place] [phone] Phone number for both [place] and [place] locations is the same.  [facility] and [facility] [address], [place] [phone]   [facility] [address] [place] or [address] [phone]   [facility] [address] [place] [phone], phone; [phone], fax Sees patients 1st and 3rd Saturday of every month.  Must not qualify for public or private insurance (i.e. Medicaid, Medicare, [place] Health Choice, Veterans' Benefits)  Household income should be no more than 200% of the poverty level The clinic cannot treat you if you are pregnant or think you are pregnant  Sexually transmitted diseases are not treated at the clinic.     Dental Care: Organization         Address  Phone  Notes  [facility] of [facility] [address] [place], [place] [phone] Accepts children up to age 76 who are enrolled in [place] or [place] Health Choice; pregnant women with a Medicaid card; and children who have applied for Medicaid or [place] Health Choice, but were declined, whose parents can pay a reduced fee at time of service.  [facility] Department of [facility]  [address], [employer]  [place] [phone]) [phone] Accepts children up to age 38 who are enrolled in Medicaid or [place] Health Choice; pregnant women with a Medicaid card; and children who have applied for Medicaid or [place] Health Choice, but were declined, whose parents can pay a reduced fee at time of service.  [facility]  [address], [place] [phone]) [phone] Patients are seen by appointment only. Walk-ins are not accepted. [facility] will see patients 38 years of age and older. Monday - Tuesday (8am-5pm) Most Wednesdays (8:30-5pm) $30 per visit, cash only  [facility]  [address], [facility] [place] [phone]) [phone] Patients are seen by appointment only. Walk-ins are not accepted. [facility] will see patients 38 years of age and older. One Wednesday Evening (Monthly: Volunteer Based).  $30 per visit, cash only  [employer] [facility] [facility]  [phone] for adults; Children under age 674, call Graduate Pediatric Dentistry at [phone]. Children aged 654-14, please call [phone] to request a pediatric application.  Dental services are provided in all areas of dental care including fillings, crowns and bridges, complete and partial dentures, implants, gum treatment, root canals, and extractions. Preventive care is also provided. Treatment is provided to both adults and children. Patients are selected via a lottery and there is often a waiting  list.   [facility] [address], [place]  [phone]) [phone] [email or web address]   [facility] [address], [place], [place] [phone], Ext. [phone] Second and Fourth Thursday of each month, opens at 6:30 AM; Clinic ends at 9 AM.  Patients are seen on a first-come first-served basis, and a limited number are seen during each clinic.   [facility]  [address], [place], [place] [phone]) [phone]   Eligibility Requirements You must have lived in [place], [place], or [place] counties for at least the last three months.   You cannot be eligible for state or federal sponsored National Cityhealthcare insurance, including CIGNAVeterans Administration, [place]icaid, or [employer]icare.   You generally cannot be eligible for healthcare insurance through your employer.    How to apply: Eligibility screenings are held every Tuesday and Wednesday afternoon from 1:00 pm until 4:00 pm. You do not need an appointment for the interview!  [facility] [address], [place], [place] [phone]   [facility]  [phone]   [facility]  [phone]   [facility]  [phone]    Behavioral Health Resources in the Community: Intensive Outpatient Programs Organization         Address  Phone  Notes  [facility] [address], [place], [place] [phone]   [facility] [address], [place], [place] [phone]   [facility] [address], [place], [place]  [phone]   [facility] [address],  [place], [place] [phone] or [phone]   Substance Abuse Resources Organization         Address  Phone  Notes  [facility]  [phone]   [facility]  [phone]   [facility]  [phone]   [facility]  [phone]   Residential & Outpatient Substance Abuse Program   [phone]   Psychological Services Organization         Address  Phone  Notes  [facility]  [phone]- [phone]   [facility]  [phone]- [phone]  [facility] [address], [place] [phone] or [phone]    Mobile Crisis Teams Organization         Address  Phone  Notes  [facility], Mobile Crisis Care Unit  [phone]   [facility]  [address] [place], [place] [phone]   Doristine LocksSharon DeEsch [address] [place] [phone]    Self-Help/Support Groups Organization         Address  Phone             Notes  [facility] [place] - variety of support groups  336- [phone] Call for more information  Narcotics Anonymous (NA), [facility] [address], [place]  2 meetings at this location   Statisticianesidential Treatment Programs Organization         Address  Phone  Notes  [facility] [address],    [place]  [phone]   [facility]  [address], [place] [phone], [place], [place] [phone]   [facility] [address], [place] [phone] Admissions: 8am-3pm M-F  [facility] [address]    [place], [place] [phone]   [facility] [address], [place], [place] [phone]   [facility] [address]  [place], [place] [phone]   [facility] - Intensive Outpatient [address], [place], [place] [phone]   [facility] ([facility] [address]  [place], [place] [phone] or [phone]   [facility] ([facility]) [address] [place], [place] [phone] Accepts Medicaid  [facility] [address]  [place] [phone] Substance Abuse/Addiction Treatment   [facility] Resources Organization          Address  Phone  Notes  [facility]  [phone]   Angie FavaJulie Brannon, PhD [address], [address] [place], [place]   [phone] or [phone]   [facility]   [address] [place], [place] [phone]   [facility] [address], [place], [place] [phone]) [phone] Insurance/Medicaid/sponsorship through [facility]  [facility] [address]                                    [place], [place] [phone]) [phone] Therapy/tele-psych/case  [facility] [address]   [place], [place] [phone]    Dr. Lolly MustacheArfeen  [phone]   [facility] [place]  [employer] [facility] 1) [address], [place] 2) [address], [place] 3)  [address], [place] [phone]) [phone]  [phone]   [facility] Child Abuse Hotline [phone]) [phone] or [phone](336) [phone] (After Hours)

## 2014-09-14 NOTE — ED Provider Notes (Signed)
CSN: [id number]     Arrival date & time 09/14/14  2020 History  This chart was scribed for Mirian Mo, MD by Elon Spanner, ED Scribe. This patient was seen in room MH01/MH01 and the patient's care was started at 9:03 PM.   Chief Complaint  Patient presents with  . Depression   The history is provided by the patient. No language interpreter was used.   HPI Comments: Crystal Sampson is a 39 y.o. female with a history of depression who presents to the Emergency Department complaining of worsening depression with associated interrupted and decreased sleep.  Patient reports increased stress lately after losing her home to foreclosure, having to move to a new location because her daughter was having school difficulties, ending a relationship with a boyfriend, having to care for the landlord's ailing dog, some disputes over rent with the landlord, and her landlord telling the patient that certain visitors are no longer allowed.  Patient reports she went online to look for ways to kill herself but did not like the mechanics of it all, so turned her from thinking that was a viable option.  Patient denies SI/HI, perceptual disturbance, CP, sore throat, cough.  Patient does not have a current plan to harm herself and states she has no intention to do so.   She also complains of nausea and vomiting onset 1 week ago with 5 episodes in the past 4 days, most recently this morning.  She also notes an associated intermittent headache worsened by stress onset this morning when she awoke as well as diarrhea, constipation, body aches, malodorous urine.  Patient denies dysuria, changes in urine color, visual disturbance.   Past Medical History  Diagnosis Date  . Asthma   . Depression     goes to [facility], quit taking meds  . 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 No past surgeries     Family History  Problem Relation Age of Onset  .  Glaucoma Mother   . Hypertension Father   . Other Neg Hx    History  Substance Use Topics  . Smoking status: Never Smoker   . Smokeless tobacco: Never Used  . Alcohol Use: No   OB History    Gravida Para Term Preterm AB TAB SAB Ectopic Multiple Living   Review of Systems  Eyes: Negative for visual disturbance.  Gastrointestinal: Positive for nausea, vomiting, diarrhea and constipation.  Genitourinary: Negative for dysuria.  Musculoskeletal: Positive for myalgias.  Psychiatric/Behavioral: Positive for sleep disturbance. Negative for suicidal ideas and hallucinations. The patient is not nervous/anxious.   All other systems reviewed and are negative.   Allergies  Penicillins  Home Medications   Prior to Admission medications   Medication Sig Start Date End Date Taking? Authorizing Provider  albuterol (PROVENTIL) (2.5 MG/3ML) 0.083% nebulizer solution Take 2.5 mg by nebulization every 6 (six) hours as needed for wheezing or shortness of breath.   Yes Historical Provider, MD  EPINEPHrine 0.3 mg/0.3 mL IJ SOAJ injection Inject 0.3 mg into the muscle daily as needed (allergic reaction).   Yes Historical Provider, MD  Fluticasone-Salmeterol (ADVAIR DISKUS) 500-50 MCG/DOSE AEPB Inhale 1 puff into the lungs 2 (two) times daily. 01/11/14  Yes Barbaraann Share, MD  albuterol (PROVENTIL HFA;VENTOLIN HFA) 108 (90 BASE) MCG/ACT inhaler Inhale 2 puffs into the lungs  every 6 (six) hours as needed for wheezing. 02/06/13   Barbaraann ShareKeith M Clance, MD  albuterol (PROVENTIL) (2.5 MG/3ML) 0.083% nebulizer solution USE ONE VIAL IN NEBULIZER EVERY 6 HOURS AS NEEDED FOR SHORTNESS OF BREATH OR WHEEZING 05/17/14   Barbaraann ShareKeith M Clance, MD  cyclobenzaprine (FLEXERIL) 10 MG tablet Take 1 tablet (10 mg total) by mouth 2 (two) times daily as needed for muscle spasms. 12/01/13   Nicole Pisciotta, PA-C  ondansetron (ZOFRAN ODT) 4 MG disintegrating tablet 4mg  ODT q4 hours prn nausea/vomit 09/14/14   Mirian MoMatthew Sahasra Belue,  MD  predniSONE (DELTASONE) 20 MG tablet Take 2 tablets (40 mg total) by mouth daily. 12/01/13   Nicole Pisciotta, PA-C   BP 125/74 mmHg  Pulse 81  Temp(Src) 98.7 F (37.1 C) (Oral)  Resp 16  Ht 5\' 8"  (1.727 m)  Wt 222 lb (100.699 kg)  BMI 33.76 kg/m2  SpO2 100%  LMP 09/12/2014 Physical Exam  Constitutional: She is oriented to person, place, and time. She appears well-developed and well-nourished.  HENT:  Head: Normocephalic and atraumatic.  Right Ear: External ear normal.  Left Ear: External ear normal.  Eyes: Conjunctivae and EOM are normal. Pupils are equal, round, and reactive to light.  Neck: Normal range of motion. Neck supple.  Cardiovascular: Normal rate, regular rhythm, normal heart sounds and intact distal pulses.   Pulmonary/Chest: Effort normal and breath sounds normal.  Abdominal: Soft. Bowel sounds are normal. There is no tenderness.  Musculoskeletal: Normal range of motion.  Neurological: She is alert and oriented to person, place, and time.  Skin: Skin is warm and dry.  Vitals reviewed.   ED Course  Procedures (including critical care time)  DIAGNOSTIC STUDIES: Oxygen Saturation is 100% on RA, normal by my interpretation.    COORDINATION OF CARE:  9:17 PM Discussed treatment plan with patient at bedside.  Patient acknowledges and agrees with plan.    Labs Review Labs Reviewed  URINALYSIS, ROUTINE W REFLEX MICROSCOPIC - Abnormal; Notable for the following:    APPearance CLOUDY (*)    Hgb urine dipstick LARGE (*)    All other components within normal limits  URINE MICROSCOPIC-ADD ON - Abnormal; Notable for the following:    Squamous Epithelial / LPF FEW (*)    Bacteria, UA MANY (*)    All other components within normal limits  URINE RAPID DRUG SCREEN (HOSP PERFORMED)    Imaging Review No results found.   EKG Interpretation None      MDM   Final diagnoses:  Depression    39 y.o. female with pertinent PMH of depression presents with  recurrent passive suicidal ideations, no active plan. She also endorses vomiting, diarrhea, myalgias. On arrival today the patient's vital signs and physical exam as above. From a psychiatric standpoint the patient denies current suicidal ideations, states that she feels safe to go home, would not hurt herself.  With regards the patient's medical symptoms, these appear consistent with gastroenteritis. The patient has a benign exam at this time, including no abdominal tenderness. Her vitals are unremarkable. We'll have the patient prescribed Zofran, follow up with PCP. She was given follow-up with primary care doctor and resource sheet for psychiatric referral.    I have reviewed all laboratory and imaging studies if ordered as above  1. Depression          Mirian MoMatthew Adaria Hole, MD 09/14/14 2329

## 2014-09-14 NOTE — ED Notes (Signed)
Spoke with pt at length about resource guide and need to have PCP and follow up for c/o stress and coping skills-pt is agreeable and does not want in pt service at this time-states "i feel a lot better after coming in"-NAD-requested RTW note for tomorrow

## 2014-09-14 NOTE — ED Notes (Signed)
Pt sts she has been under a lot of stress lately and feeling depressed. She sts that a couple of days ago she began questioning "Why am I here?". She also sts that she googled ways to kill herself but found the methods not to her liking. She sts that she does not have SI/HI and sts that it was a "momentary thing" and she was just confused at why others are happy but she is not. She sts that she is "past that stage." She is also c/o HA and body aches.

## 2014-10-13 ENCOUNTER — Telehealth: Payer: Self-pay | Admitting: Internal Medicine

## 2014-10-13 NOTE — Telephone Encounter (Signed)
When pt. Came in for her last shot 12/28/13 she said she would have to waiting until her new ins. Went into effect. [place] Do we consider her d/c'd ?

## 2014-10-13 NOTE — Telephone Encounter (Signed)
Yes, She is dc'd

## 2014-10-14 NOTE — Telephone Encounter (Signed)
Noted  

## 2014-11-08 ENCOUNTER — Encounter: Payer: Self-pay | Admitting: Internal Medicine

## 2014-12-09 ENCOUNTER — Telehealth: Payer: Self-pay | Admitting: Pulmonary Disease

## 2014-12-09 MED ORDER — ALBUTEROL SULFATE HFA 108 (90 BASE) MCG/ACT IN AERS: 2.0000 | INHALATION_SPRAY | Freq: Four times a day (QID) | RESPIRATORY_TRACT | Status: DC | PRN

## 2014-12-09 NOTE — Telephone Encounter (Signed)
lmtcb X1 for pt.  [employer] assistance forms have been placed up front for pt pickup.

## 2014-12-09 NOTE — Telephone Encounter (Signed)
Pt aware that rx has been sent to preferred pharmacy.  Nothing further needed.

## 2014-12-09 NOTE — Telephone Encounter (Signed)
Patient came back and states refill to be called in [place], [place], [phone].

## 2014-12-09 NOTE — Telephone Encounter (Signed)
LM for patient - nothing needed after reading message- pt can disregard the voicemail I left.  We will call her if anything needed further

## 2014-12-09 NOTE — Telephone Encounter (Signed)
Patient completed paperwork while here and has returned it back to me.  Will be up front.

## 2014-12-10 ENCOUNTER — Emergency Department (HOSPITAL_COMMUNITY): Payer: Medicaid Other

## 2014-12-10 ENCOUNTER — Emergency Department (HOSPITAL_COMMUNITY)
Admission: EM | Admit: 2014-12-10 | Discharge: 2014-12-10 | Disposition: A | Discharge status: Home / Self Care | Payer: Medicaid Other | Attending: Emergency Medicine | Admitting: Emergency Medicine

## 2014-12-10 ENCOUNTER — Encounter (HOSPITAL_COMMUNITY): Payer: Self-pay | Admitting: *Deleted

## 2014-12-10 DIAGNOSIS — Z8619 Personal history of other infectious and parasitic diseases: Secondary | ICD-10-CM | POA: Diagnosis not present

## 2014-12-10 DIAGNOSIS — Z7952 Long term (current) use of systemic steroids: Secondary | ICD-10-CM | POA: Insufficient documentation

## 2014-12-10 DIAGNOSIS — R079 Chest pain, unspecified: Secondary | ICD-10-CM | POA: Insufficient documentation

## 2014-12-10 DIAGNOSIS — Z79899 Other long term (current) drug therapy: Secondary | ICD-10-CM | POA: Insufficient documentation

## 2014-12-10 DIAGNOSIS — Z7951 Long term (current) use of inhaled steroids: Secondary | ICD-10-CM | POA: Insufficient documentation

## 2014-12-10 DIAGNOSIS — J45909 Unspecified asthma, uncomplicated: Secondary | ICD-10-CM | POA: Diagnosis not present

## 2014-12-10 DIAGNOSIS — Z8659 Personal history of other mental and behavioral disorders: Secondary | ICD-10-CM | POA: Diagnosis not present

## 2014-12-10 DIAGNOSIS — Z880 Allergy status to penicillin: Secondary | ICD-10-CM | POA: Diagnosis not present

## 2014-12-10 LAB — BASIC METABOLIC PANEL
Anion gap: 8 (ref 5–15)
BUN: 9 mg/dL (ref 6–20)
CO2: 23 mmol/L (ref 22–32)
CREATININE: 0.79 mg/dL (ref 0.44–1.00)
Calcium: 9.1 mg/dL (ref 8.9–10.3)
Chloride: 106 mmol/L (ref 101–111)
GFR calc Af Amer: >60 mL/min (ref >60)
GLUCOSE: 94 mg/dL (ref 65–99)
POTASSIUM: 4.3 mmol/L (ref 3.5–5.1)
Sodium: 137 mmol/L (ref 135–145)

## 2014-12-10 LAB — CBC
HCT: 39.7 % (ref 36.0–46.0)
HEMOGLOBIN: 13.5 g/dL (ref 12.0–15.0)
MCH: 30.7 pg (ref 26.0–34.0)
MCHC: 34 g/dL (ref 30.0–36.0)
MCV: 90.2 fL (ref 78.0–100.0)
Platelets: 363 10*3/uL (ref 150–400)
RBC: 4.4 MIL/uL (ref 3.87–5.11)
RDW: 12.7 % (ref 11.5–15.5)
WBC: 8.6 10*3/uL (ref 4.0–10.5)

## 2014-12-10 LAB — I-STAT TROPONIN, ED: Troponin i, poc: 0 ng/mL (ref 0.00–0.08)

## 2014-12-10 LAB — BRAIN NATRIURETIC PEPTIDE: B Natriuretic Peptide: 6.6 pg/mL (ref 0.0–100.0)

## 2014-12-10 MED ORDER — GI COCKTAIL ~~LOC~~
30.0000 mL | Freq: Once | ORAL | Status: AC
  Administered 2014-12-10: 30 mL via ORAL
  Filled 2014-12-10: qty 30

## 2014-12-10 MED ORDER — MORPHINE SULFATE 4 MG/ML IJ SOLN
4.0000 mg | Freq: Once | INTRAMUSCULAR | Status: DC
  Filled 2014-12-10: qty 1

## 2014-12-10 MED ORDER — IPRATROPIUM-ALBUTEROL 0.5-2.5 (3) MG/3ML IN SOLN
3.0000 mL | Freq: Once | RESPIRATORY_TRACT | Status: AC
  Administered 2014-12-10: 3 mL via RESPIRATORY_TRACT
  Filled 2014-12-10: qty 3

## 2014-12-10 MED ORDER — KETOROLAC TROMETHAMINE 30 MG/ML IJ SOLN
30.0000 mg | Freq: Once | INTRAMUSCULAR | Status: AC
  Administered 2014-12-10: 30 mg via INTRAVENOUS
  Filled 2014-12-10: qty 1

## 2014-12-10 MED ORDER — ALBUTEROL SULFATE (2.5 MG/3ML) 0.083% IN NEBU: 5.0000 mg | INHALATION_SOLUTION | Freq: Four times a day (QID) | RESPIRATORY_TRACT | Status: DC | PRN

## 2014-12-10 MED ORDER — SIMETHICONE 80 MG PO CHEW
160.0000 mg | CHEWABLE_TABLET | Freq: Once | ORAL | Status: AC
  Administered 2014-12-10: 160 mg via ORAL
  Filled 2014-12-10: qty 2

## 2014-12-10 NOTE — ED Notes (Signed)
Pt. States still unable to void.

## 2014-12-10 NOTE — ED Notes (Signed)
Pt. States the chest pain started 3 days ago with what felt like gas bubbles passing through her chest. Pt. Claims she was burping constantly throughout today. Pt. States that yesterday she started having shortness of breath with radiation to her back. Pt. Unable to lay down comfortably. Pain is a 7/10

## 2014-12-10 NOTE — ED Provider Notes (Signed)
CSN: [id number]     Arrival date & time 12/10/14  7829 History   First MD Initiated Contact with Patient 12/10/14 [phone]     Chief Complaint  Patient presents with  . Chest Pain     (Consider location/radiation/quality/duration/timing/severity/associated sxs/prior Treatment) The history is provided by the patient and medical records.    This is a 39 y.o. F with PMH significant for asthma, depression, HSV, presenting to the ED for chest pain.  Patient states pain began 3 days ago, described as a "gas bubbles" moving in her chest.  She states pain is fairly constant but variable in severity.  She reports when pain is most intense it "takes her breath away" and causes some pain in her back.  She denies dizziness, diaphoresis, nausea, vomiting, or weakness.  Patient admits she has been burping a great deal over the past few days which provides some relief of pain.  No fever, chills.  No personal cardiac hx.  Mother has hx of CAD, no MI.  Patient has never been a smoker.  No recent travel, surgeries, prolonged immobilization, or exogenous estrogen use.  No hx of DVT or PE.  She has tried OTC pepto bismol without relief of symptoms.  States she went to the [facility] clinic yesterday for refill of her medications, namely her albuterol solution for nebulizer machine, however called in the wrong script.  VSS.  Past Medical History  Diagnosis Date  . Asthma   . Depression     goes to [facility], quit taking meds  . 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 No past surgeries     Family History  Problem Relation Age of Onset  . Glaucoma Mother   . Hypertension Father   . Other Neg Hx    History  Substance Use Topics  . Smoking status: Never Smoker   . Smokeless tobacco: Never Used  . Alcohol Use: No   OB History    Gravida Para Term Preterm AB TAB SAB Ectopic Multiple Living   Review of Systems   Cardiovascular: Positive for chest pain.  Gastrointestinal:       Belching  All other systems reviewed and are negative.     Allergies  Penicillins  Home Medications   Prior to Admission medications   Medication Sig Start Date End Date Taking? Authorizing Provider  albuterol (PROVENTIL HFA;VENTOLIN HFA) 108 (90 BASE) MCG/ACT inhaler Inhale 2 puffs into the lungs every 6 (six) hours as needed for wheezing. 12/09/14  Yes Nyoka Cowden, MD  albuterol (PROVENTIL) (2.5 MG/3ML) 0.083% nebulizer solution USE ONE VIAL IN NEBULIZER EVERY 6 HOURS AS NEEDED FOR SHORTNESS OF BREATH OR WHEEZING 05/17/14  Yes Barbaraann Share, MD  EPINEPHrine 0.3 mg/0.3 mL IJ SOAJ injection Inject 0.3 mg into the muscle daily as needed (allergic reaction).   Yes Historical Provider, MD  Fluticasone-Salmeterol (ADVAIR DISKUS) 500-50 MCG/DOSE AEPB Inhale 1 puff into the lungs 2 (two) times daily. 01/11/14  Yes Barbaraann Share, MD  cyclobenzaprine (FLEXERIL) 10 MG tablet Take 1 tablet (10 mg total) by mouth 2 (two) times daily as needed for muscle spasms. Patient not taking: Reported on 12/10/2014 12/01/13   Joni Reining Pisciotta, PA-C  ondansetron (ZOFRAN ODT) 4 MG disintegrating tablet  ODT q4 hours prn nausea/vomit Patient not taking: Reported on 12/10/2014 09/14/14  Mirian Mo, MD  predniSONE (DELTASONE) 20 MG tablet Take 2 tablets (40 mg total) by mouth daily. Patient not taking: Reported on 12/10/2014 12/01/13   Joni Reining Pisciotta, PA-C   BP 107/71 mmHg  Pulse 78  Temp(Src) 98.7 F (37.1 C) (Oral)  Resp 13  SpO2 96%  LMP 11/21/2014   Physical Exam  Constitutional: She is oriented to person, place, and time. She appears well-developed and well-nourished. No distress.  HENT:  Head: Normocephalic and atraumatic.  Mouth/Throat: Oropharynx is clear and moist.  Eyes: Conjunctivae and EOM are normal. Pupils are equal, round, and reactive to light.  Neck: Normal range of motion. Neck supple.  Cardiovascular: Normal  rate, regular rhythm and normal heart sounds.   Pulmonary/Chest: Effort normal and breath sounds normal. No respiratory distress. She has no wheezes.  Abdominal: Soft. Bowel sounds are normal. There is no tenderness. There is no guarding.  Musculoskeletal: Normal range of motion. She exhibits no edema.  Neurological: She is alert and oriented to person, place, and time.  Skin: Skin is warm and dry. She is not diaphoretic.  Psychiatric: She has a normal mood and affect.  Nursing note and vitals reviewed.   ED Course  Procedures (including critical care time) Labs Review Labs Reviewed  CBC  BASIC METABOLIC PANEL  BRAIN NATRIURETIC PEPTIDE  I-STAT TROPOININ, ED  POC URINE PREG, ED    Imaging Review Dg Chest Port 1 View  12/10/2014   CLINICAL DATA:  Left-sided chest pain for 1 day.  EXAM: PORTABLE CHEST - 1 VIEW  COMPARISON:  07/19/2012  FINDINGS: The heart size and mediastinal contours are within normal limits. Both lungs are clear. The visualized skeletal structures are unremarkable.  IMPRESSION: No active disease.   Electronically Signed   By: Burman Nieves M.D.   On: 12/10/2014 06:13     EKG Interpretation   Date/Time:  Friday December 10 2014 05:38:11 EDT Ventricular Rate:  70 PR Interval:  192 QRS Duration: 82 QT Interval:  382 QTC Calculation: 412 R Axis:   84 Text Interpretation:  Sinus rhythm Borderline T abnormalities, anterior  leads ST elev, probable normal early repol pattern When compared with ECG  of 01/04/2004, No significant change was found Confirmed by [facility]  MD,  DAVID ([id number]) on 12/10/2014 5:45:26 AM      MDM   Final diagnoses:  Chest pain, unspecified chest pain type   39 year old female here with 3 days of ongoing chest pain, variable in severity. She equates this to "gas bubbles" moving throughout her chest. She does admit to a large amount of belching. patient does have some expiratory wheezing on exam-- has been without her albuterol for a few days  now.  EKG sinus rhythm without acute ischemic changes. Lab work and CXR pending.  Patient doing albuterol treatment now.  Ordered additional GI cocktail and gas-x.  Lab work reassuring.  CXR pending.    9:02 AM Went into check on patient, she is asleep in room.  Awoke her, discussed lab and CXR results.  States chest pain has eased off but still mildly present.  Given negative work-up here after 3 days of ongoing CP, i have lower suspicion for ACS, PE, dissection, or other acute cardiac event.  Patient is PERC negative, VSS.  Will d/c home with supportive care, will refill her neb solution for home.  FU with PCP.  Discussed plan with patient, he/she acknowledged understanding and agreed with plan of care.  Return precautions given for new or worsening  symptoms.  Garlon Hatchet, PA-C 12/10/14 1026  Dione Booze, MD 12/10/14 [phone]

## 2014-12-10 NOTE — ED Notes (Signed)
Lisa Sanders, PA at bedside.  

## 2014-12-10 NOTE — ED Notes (Signed)
Pt. States chest pain increased to an 8/10 another EKG captured and given to MD

## 2014-12-10 NOTE — Discharge Instructions (Signed)
May need to continue TUMS, pepto, gas-x, maalox, etc at home if gas pains continues. Use albuterol neb treatments when needed. Follow-up with your primary care physician. Return to the ED for new or worsening symptoms.

## 2014-12-14 ENCOUNTER — Ambulatory Visit (INDEPENDENT_AMBULATORY_CARE_PROVIDER_SITE_OTHER): Payer: Medicaid Other | Admitting: Internal Medicine

## 2014-12-14 ENCOUNTER — Encounter: Payer: Self-pay | Admitting: Internal Medicine

## 2014-12-14 VITALS — BP 130/76 | HR 77 | Ht 68.0 in | Wt 216.0 lb

## 2014-12-14 DIAGNOSIS — E669 Obesity, unspecified: Secondary | ICD-10-CM | POA: Diagnosis not present

## 2014-12-14 DIAGNOSIS — R0789 Other chest pain: Secondary | ICD-10-CM

## 2014-12-14 DIAGNOSIS — J4540 Moderate persistent asthma, uncomplicated: Secondary | ICD-10-CM | POA: Diagnosis not present

## 2014-12-14 MED ORDER — FLUTICASONE FUROATE-VILANTEROL 100-25 MCG/INH IN AEPB: 1.0000 | INHALATION_SPRAY | Freq: Every morning | RESPIRATORY_TRACT | Status: DC

## 2014-12-14 MED ORDER — ALBUTEROL SULFATE (2.5 MG/3ML) 0.083% IN NEBU: 5.0000 mg | INHALATION_SOLUTION | Freq: Four times a day (QID) | RESPIRATORY_TRACT | Status: DC | PRN

## 2014-12-14 NOTE — Progress Notes (Signed)
Subjective:     Patient ID: Crystal Sampson, female   DOB: 08/21/1975,   MRN: [id number]  HPI  16 yobf prev followed by Dr Shelle Iron seen in pulmonary clinic 12/14/2014 for recurrent L cp.   12/14/2014 f/u ov/Carissa Musick re: L cp Chief Complaint  Patient presents with  . Acute Visit    Pt c/o left side chest and back pain for the past wk.  Her breathing is unchanged since her last visit.   in the past sev years, recurrent pain in same location lasts for sev days goes away with tums but this time is the worst ever started a week prior to OV abuptly  And nothing has helped except GI cocktail at [facility] ER 12/10/14  Pain is "real sharp" comes in waves, best in am and worst in sitting position, ant > post but diffuse and no assoc sob, diaph n or v    In terms of asthma is maintained on advair 500 one bid. But requesting albuterol neb and did not like it when we called saba hfa - says doesn't really  Use the albuterol "but find I need it at bedtime"  No obvious day to day or daytime variabilty or assoc chronic cough or classically pleuritic or ex cp or chest tightness, subjective wheeze overt sinus or hb symptoms. No unusual exp hx or h/o childhood pna/ asthma or knowledge of premature birth.  Sleeping ok without nocturnal  or early am exacerbation  of respiratory  c/o's or need for noct saba. Also denies any obvious fluctuation of symptoms with weather or environmental changes or other aggravating or alleviating factors except as outlined above   Current Medications, Allergies, Complete Past Medical History, Past Surgical History, Family History, and Social History were reviewed in [employer] record.  ROS  The following are not active complaints unless bolded sore throat, dysphagia, dental problems, itching, sneezing,  nasal congestion or excess/ purulent secretions, ear ache,   fever, chills, sweats, unintended wt loss,   hemoptysis,  orthopnea pnd or leg swelling, presyncope,  palpitations, abdominal pain, anorexia, nausea, vomiting, diarrhea  or change in bowel or urinary habits, change in stools or urine, dysuria,hematuria,  rash, arthralgias, visual complaints, headache, numbness weakness or ataxia or problems with walking or coordination,  change in mood/affect or memory.           Review of Systems     Objective:   Physical Exam amb obese wf nad  Wt Readings from Last 3 Encounters:  12/14/14 216 lb (97.977 kg)  09/14/14 222 lb (100.699 kg)  12/01/13 229 lb (103.874 kg)    Vital signs reviewed   HEENT: nl dentition, turbinates, and orophanx. Nl external ear canals without cough reflex   NECK :  without JVD/Nodes/TM/ nl carotid upstrokes bilaterally   LUNGS: no acc muscle use, clear to A and P bilaterally without cough on insp or exp maneuvers   CV:  RRR  no s3 or murmur or increase in P2, no edema   ABD:  soft and nontender with nl excursion in the supine position. No bruits or organomegaly, bowel sounds nl  MS:  warm without deformities, calf tenderness, cyanosis or clubbing  SKIN: warm and dry without lesions    NEURO:  alert, approp, no deficits     I personally reviewed images and agree with radiology impression as follows:  CXR:  12/10/14 The heart size and mediastinal contours are within normal limits. Both lungs are clear. The visualized skeletal  structures are Unremarkable.  Labs  reviewed:      Lab 12/10/14 0604  NA 137  K 4.3  CL 106  CO2 23  BUN 9  CREATININE 0.79  GLUCOSE 94    Recent Labs Lab 12/10/14 0604  HGB 13.5  HCT 39.7  WBC 8.6  PLT 363              Assessment:

## 2014-12-14 NOTE — Patient Instructions (Addendum)
Classic subdiaphragmatic pain pattern suggests ibs:  Stereotypical, migratory with a very limited distribution of pain locations, daytime, not exacerbated by ex or coughing, worse in sitting position, associated with generalized abd bloating, not present supine due to the dome effect of the diaphragm is  canceled in that position. Frequently these patients have had multiple negative GI workups and CT scans.  Treatment consists of avoiding foods that cause gas (especially [place] food, boiled eggs/ beans and raw vegetables like spinach and salads)  and citrucel 1 heaping tsp twice daily with a large glass of water.  Pain should improve w/in 2 weeks and if not then consider further GI work up.  BREO one puff each am > fill the prescription if you like it  If you start needing the nebulizer more than very rarely we need to see you back here right away  Call if condition worsening but this should gradually get better       If you are satisfied with your treatment plan,  let your doctor know and he/she can either refill your medications or you can return here when your prescription runs out.     If in any way you are not 100% satisfied,  please tell us.  If 100% better, tell your friends!  Pulmonary follow up is as needed

## 2014-12-15 ENCOUNTER — Encounter: Payer: Self-pay | Admitting: Internal Medicine

## 2014-12-15 DIAGNOSIS — R079 Chest pain, unspecified: Secondary | ICD-10-CM | POA: Insufficient documentation

## 2014-12-15 DIAGNOSIS — R0789 Other chest pain: Secondary | ICD-10-CM | POA: Insufficient documentation

## 2014-12-15 DIAGNOSIS — E669 Obesity, unspecified: Secondary | ICD-10-CM | POA: Insufficient documentation

## 2014-12-15 NOTE — Assessment & Plan Note (Addendum)
Old records obtained:  IgE 1877, RAST very abnormal 2007. Aspergillus CF <1:8 [name] 2001: FEV1 2.38 (68%), ratio 80. Acute exacerbation/hospitalization 03/2012 [name] 04/2012:  FEV1 1.72 (58%), ratio 68 No response to singulair.   I strongly doubt any of her present symptoms are related to asthma or allergy but worry she has very poor insight and poor hfa/ dpi technique and may be overreliant on saba   I had an extended discussion with the patient reviewing all relevant studies completed to date and  lasting 52m If your breathing worsens or you need to use your rescue inhaler more than twice weekly or wake up more than twice a month with any respiratory symptoms or require more than two rescue inhalers per year, we need to see you right away because this means we're not controlling the underlying problem (inflammation) adequately.  Rescue inhalers do not control inflammation and overuse can lead to unnecessary and costly consequences.  They can make you feel better temporarily but eventually they will quit working effectively much as sleep aids lead to more insomnia if used regularly.   The proper method of use, as well as anticipated side effects, of a dry powder  inhaler are discussed and demonstrated to the patient. Improved effectiveness after extensive coaching during this visit to a level of approximately  90% from a baseline of < 50%   Not clear she benefitted from allergy rx > f/u can be prn  As above  Each maintenance medication was reviewed in detail including most importantly the difference between maintenance and as needed and under what circumstances the prns are to be used.  Please see instructions for details which were reviewed in writing and the patient given a copy.     See sep discussion re recurrent cp

## 2014-12-15 NOTE — Assessment & Plan Note (Signed)
Body mass index is 32.85 kg/(m^2).  Lab Results  Component Value Date   TSH 0.337* 03/24/2012     Neg cal balance needed > f/u primary care encouraged

## 2014-12-15 NOTE — Assessment & Plan Note (Signed)
Classic subdiaphragmatic pain pattern suggests ibs:  Stereotypical, migratory with a very limited distribution of pain locations, daytime, not exacerbated by ex or coughing, worse in sitting position, associated with generalized abd bloating, not present supine due to the dome effect of the diaphragm is  canceled in that position. Frequently these patients have had multiple negative GI workups and CT scans.  Treatment consists of avoiding foods that cause gas (especially beans and raw vegetables like spinach and salads)  and citrucel 1 heaping tsp twice daily with a large glass of water.  Pain should improve w/in 2 weeks and if not then consider further pulmonary / gi w/u .

## 2014-12-31 ENCOUNTER — Telehealth: Payer: Self-pay | Admitting: Internal Medicine

## 2014-12-31 NOTE — Telephone Encounter (Signed)
Patient came by office requesting sample of Breo and [facility]. Patient given sample of Breo and [employer] paperwork. Nothing further needed.

## 2015-01-03 ENCOUNTER — Telehealth: Payer: Self-pay | Admitting: Internal Medicine

## 2015-01-03 MED ORDER — FLUTICASONE FUROATE-VILANTEROL 100-25 MCG/INH IN AEPB: 1.0000 | INHALATION_SPRAY | Freq: Every morning | RESPIRATORY_TRACT | Status: DC

## 2015-01-03 NOTE — Telephone Encounter (Signed)
Spoke with the pt. She had all the correct paperwork. These forms have been given to [place] to follow up on.

## 2015-01-04 NOTE — Telephone Encounter (Signed)
Forms were faxed along with 90 day supply rx

## 2015-01-11 ENCOUNTER — Ambulatory Visit: Payer: Medicaid Other | Admitting: Pulmonary Disease

## 2015-01-18 ENCOUNTER — Telehealth: Payer: Self-pay | Admitting: Critical Care Medicine

## 2015-01-18 NOTE — Telephone Encounter (Signed)
Pt in office, states her application for assistance for breo was denied d/t her insurance: states that her insurance should cover the medication.  Per pt, she is having a lot of difficulty with getting her insurance info straightened out, and in the meantime is here for a coupon or sample of breo.  We do not have any breo 100 samples at this time or any coupons, but I was able to print off a 30-day supply card for pt.  I also advised her to call back periodically to see if we have samples to place up front for her.  Pt aware.  Nothing further needed.

## 2015-03-11 ENCOUNTER — Telehealth: Payer: Self-pay | Admitting: Internal Medicine

## 2015-03-11 NOTE — Telephone Encounter (Signed)
Two Breo 100 samples given to the pt. Pt stated she is going through issues with her insurance as it was not filled correctly. Pt stated she has an upcoming hearing with her insurance to settle things. Pt aware to contact us back for any further issues. Nothing further needed at this time. Will sign off.

## 2015-04-11 ENCOUNTER — Telehealth: Payer: Self-pay | Admitting: Internal Medicine

## 2015-04-11 NOTE — Telephone Encounter (Signed)
Spoke to pt. Pt is requesting Breo 100 sample. One sample given to pt. Pt stated she applied and was denied for pt assistance and now working with the state to have her medicaid cover both her doctor visits and her medications (right now her medicaid does not cover medication). Nothing further needed at this time.

## 2015-04-28 ENCOUNTER — Telehealth: Payer: Self-pay | Admitting: Internal Medicine

## 2015-04-28 NOTE — Telephone Encounter (Signed)
Pt returned call.Crystal Sampson ° °

## 2015-04-28 NOTE — Telephone Encounter (Signed)
lmomtcb x1 

## 2015-04-28 NOTE — Telephone Encounter (Signed)
Spoke with the pt  She states that she is still having trouble with getting her medicaid to cover med costs  I have left her 2 boxes of Breo 100 up front for pick up  Nothing further needed

## 2015-05-27 ENCOUNTER — Telehealth: Payer: Self-pay | Admitting: Internal Medicine

## 2015-05-27 NOTE — Telephone Encounter (Signed)
Called and spoke with the patient. She states that she worked with her insurance on getting breo covered. She states [facility] [address] I informed her that [facility] [address] [facility]2014 and I stated that [facility] I explained to her that I would contact [facility] to make sure she had refills left and I would call her back with confirmation. [facility] He stated that patient had 4 refills left and they were available for pickup.  I called informed patient that [facility] and [facility] further [facility] [address]. Nothing further needed.

## 2015-06-02 ENCOUNTER — Telehealth: Payer: Self-pay | Admitting: Internal Medicine

## 2015-06-02 NOTE — Telephone Encounter (Signed)
Spoke with pt, states that her pharmacy is not able to fill her Virgel BouquetBreo and is requesting a sample to last until this is straightened out.  Sample left up front for pt.   Called pharmacy, states that breo needs PA.  PA form being faxed to office.  Will await fax.

## 2015-06-03 NOTE — Telephone Encounter (Signed)
Still waiting on PA form

## 2015-06-08 NOTE — Telephone Encounter (Signed)
Pt is aware of MW's recommendation. Samples have been left at the front desk. OV has been scheduled for 07/08/15 at 11:30am. Nothing further was needed.

## 2015-06-08 NOTE — Telephone Encounter (Signed)
MW you told pt to f/u PRN in June. Do you want [facility] or should pt go through PCP?

## 2015-06-08 NOTE — Telephone Encounter (Signed)
Give samples/ schedule ov with formulary 4 weeks

## 2015-06-27 ENCOUNTER — Emergency Department (HOSPITAL_COMMUNITY)
Admission: EM | Admit: 2015-06-27 | Discharge: 2015-06-27 | Disposition: A | Discharge status: Home / Self Care | Payer: Medicaid Other | Attending: Emergency Medicine | Admitting: Emergency Medicine

## 2015-06-27 ENCOUNTER — Encounter (HOSPITAL_COMMUNITY): Payer: Self-pay | Admitting: Emergency Medicine

## 2015-06-27 DIAGNOSIS — J45909 Unspecified asthma, uncomplicated: Secondary | ICD-10-CM | POA: Diagnosis not present

## 2015-06-27 DIAGNOSIS — Z7951 Long term (current) use of inhaled steroids: Secondary | ICD-10-CM | POA: Diagnosis not present

## 2015-06-27 DIAGNOSIS — R21 Rash and other nonspecific skin eruption: Secondary | ICD-10-CM | POA: Diagnosis present

## 2015-06-27 DIAGNOSIS — Z880 Allergy status to penicillin: Secondary | ICD-10-CM | POA: Insufficient documentation

## 2015-06-27 DIAGNOSIS — L299 Pruritus, unspecified: Secondary | ICD-10-CM | POA: Insufficient documentation

## 2015-06-27 DIAGNOSIS — Z8659 Personal history of other mental and behavioral disorders: Secondary | ICD-10-CM | POA: Insufficient documentation

## 2015-06-27 DIAGNOSIS — Z8619 Personal history of other infectious and parasitic diseases: Secondary | ICD-10-CM | POA: Insufficient documentation

## 2015-06-27 NOTE — ED Provider Notes (Signed)
CSN: [id number]     Arrival date & time 06/27/15  0038 History   First MD Initiated Contact with Patient 06/27/15 [phone]     Chief Complaint  Patient presents with  . Allergic Reaction     (Consider location/radiation/quality/duration/timing/severity/associated sxs/prior Treatment) The history is provided by the patient.   Crystal Sampson is a 40 y.o. female who presents to the ED with what she thinks was an allergic reaction. She reports that she noted itching and redness of the palms of her hands after using peroxide on her hands. The rash had gone by the time she arrived at the ED. She denies any other problems.   Past Medical History  Diagnosis Date  . Asthma   . Depression     [facility], quit taking meds  . 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 No past surgeries     Family History  Problem Relation Age of Onset  . Glaucoma Mother   . Hypertension Father   . Other Neg Hx    Social History  Substance Use Topics  . Smoking status: Never Smoker   . Smokeless tobacco: Never Used  . Alcohol Use: No   OB History    Gravida Para Term Preterm AB TAB SAB Ectopic Multiple Living   3 2 2  1 1    2      Review of Systems Negative except as stated in HPI   Allergies  Penicillins  Home Medications   Prior to Admission medications   Medication Sig Start Date End Date Taking? Authorizing Provider  albuterol (PROVENTIL) (2.5 MG/3ML) 0.083% nebulizer solution Take 6 mLs (5 mg total) by nebulization every 6 (six) hours as needed for wheezing or shortness of breath. 12/14/14   Nyoka CowdenMichael B Wert, MD  EPINEPHrine 0.3 mg/0.3 mL IJ SOAJ injection Inject 0.3 mg into the muscle daily as needed (allergic reaction).    Historical Provider, MD  Fluticasone Furoate-Vilanterol (BREO ELLIPTA) 100-25 MCG/INH AEPB Inhale 1 puff into the lungs every morning. 01/03/15   Nyoka CowdenMichael B Wert, MD   BP 132/72 mmHg  Pulse 57   Temp(Src) 98.3 F (36.8 C) (Oral)  Resp 16  SpO2 99% Physical Exam  Constitutional: She is oriented to person, place, and time. She appears well-developed and well-nourished. No distress.  HENT:  Head: Normocephalic.  Mouth/Throat: Uvula is midline, oropharynx is clear and moist and mucous membranes are normal.  Eyes: Conjunctivae and EOM are normal.  Neck: Neck supple.  Cardiovascular: Normal rate and regular rhythm.   Pulmonary/Chest: Effort normal and breath sounds normal. She has no wheezes.  Musculoskeletal: Normal range of motion.  Palms of hand dry but no open lesions, no rash noted.   Neurological: She is alert and oriented to person, place, and time. No cranial nerve deficit.  Skin: Skin is warm and dry.  Psychiatric: She has a normal mood and affect. Her behavior is normal.  Nursing note and vitals reviewed.   ED Course  Procedures   MDM  40 y.o. female with rash and itching of her hand after using peroxide that resolved prior to exam. Stable for d/c without any symptoms at this time. Discussed with the patient need for moisturizing cream and return for worsening symptoms.   Final diagnoses:  Rash of hands       Janne NapoleonHope M Muhamed Luecke, NP 06/27/15 0235  Melene Planan Floyd, DO 06/27/15 1816

## 2015-06-27 NOTE — ED Notes (Signed)
Pt was using peroxide and had "white spots" develop all over her hands.  Since arriving the spots have disappeared.

## 2015-06-27 NOTE — ED Notes (Signed)
See NP assessment. 

## 2015-06-27 NOTE — Discharge Instructions (Signed)
Your hands are dry due to the use of the peroxide on them. Use a moisturizing cream. Return as needed for any problems.

## 2015-07-08 ENCOUNTER — Encounter: Payer: Self-pay | Admitting: Internal Medicine

## 2015-07-08 ENCOUNTER — Ambulatory Visit (INDEPENDENT_AMBULATORY_CARE_PROVIDER_SITE_OTHER): Payer: Medicaid Other | Admitting: Internal Medicine

## 2015-07-08 ENCOUNTER — Ambulatory Visit: Payer: Self-pay | Admitting: Internal Medicine

## 2015-07-08 VITALS — BP 122/78 | HR 83 | Ht 68.0 in | Wt 212.0 lb

## 2015-07-08 DIAGNOSIS — J4540 Moderate persistent asthma, uncomplicated: Secondary | ICD-10-CM

## 2015-07-08 MED ORDER — MOMETASONE FURO-FORMOTEROL FUM 100-5 MCG/ACT IN AERO: INHALATION_SPRAY | RESPIRATORY_TRACT | Status: DC

## 2015-07-08 MED ORDER — ALBUTEROL SULFATE HFA 108 (90 BASE) MCG/ACT IN AERS: INHALATION_SPRAY | RESPIRATORY_TRACT | Status: DC

## 2015-07-08 NOTE — Progress Notes (Signed)
Subjective:     Patient ID: Crystal Sampson, female   DOB: [birth year],   MRN: [id number]  HPI  51 yobf prev followed by Dr Shelle Iron seen in pulmonary clinic 12/14/2014 for recurrent L cp.   12/14/2014 f/u ov/Sharin Altidor re: L cp Chief Complaint  Patient presents with  . Acute Visit    Pt c/o left side chest and back pain for the past wk.  Her breathing is unchanged since her last visit.   in the past sev years, recurrent pain in same location lasts for sev days goes away with tums but this time is the worst ever started a week prior to OV abuptly  And nothing has helped except GI cocktail at [facility] ER 12/10/14  Pain is "real sharp" comes in waves, best in am and worst in sitting position, ant > post but diffuse and no assoc sob, diaph n or v  rec Classic subdiaphragmatic pain pattern suggests ibs:    Treatment consists of avoiding foods that cause gas (especially [place] food, boiled eggs/ beans and raw vegetables like spinach and salads)  and citrucel 1 heaping tsp twice daily with a large glass of water.  Pain should improve w/in 2 weeks and if not then consider further GI work up. BREO one puff each am > fill the prescription if you like it If you start needing the nebulizer more than very rarely we need to see you back here right away   07/08/2015  f/u ov/Janaisa Birkland re: asthma better on breo /  Has neb but not using  Chief Complaint  Patient presents with  . Follow-up    Pt states that her breathing is doing well. Pt denies cough/wheeze/SOB/CP/tightness. Pt states that her asthma seems well controlled on the Breo.    insurance not paying for BREO but she's doing well. Not limited by breathing from desired activities    No obvious day to day or daytime variabilty or assoc chronic cough or classically pleuritic or ex cp or chest tightness, subjective wheeze overt sinus or hb symptoms. No unusual exp hx or h/o childhood pna/ asthma or knowledge of premature birth.  Sleeping ok without nocturnal  or early am  exacerbation  of respiratory  c/o's or need for noct saba. Also denies any obvious fluctuation of symptoms with weather or environmental changes or other aggravating or alleviating factors except as outlined above   Current Medications, Allergies, Complete Past Medical History, Past Surgical History, Family History, and Social History were reviewed in [employer] record.  ROS  The following are not active complaints unless bolded sore throat, dysphagia, dental problems, itching, sneezing,  nasal congestion or excess/ purulent secretions, ear ache,   fever, chills, sweats, unintended wt loss,   hemoptysis,  orthopnea pnd or leg swelling, presyncope, palpitations, abdominal pain, anorexia, nausea, vomiting, diarrhea  or change in bowel or urinary habits, change in stools or urine, dysuria,hematuria,  rash, arthralgias, visual complaints, headache, numbness weakness or ataxia or problems with walking or coordination,  change in mood/affect or memory.           Objective:   Physical Exam  amb obese wf nad   07/08/2015       212   12/14/14 216 lb (97.977 kg)  09/14/14 222 lb (100.699 kg)  12/01/13 229 lb (103.874 kg)    Vital signs reviewed   HEENT: nl dentition, turbinates, and orophanx. Nl external ear canals without cough reflex   NECK :  without JVD/Nodes/TM/ nl  carotid upstrokes bilaterally   LUNGS: no acc muscle use, clear to A and P bilaterally without cough on insp or exp maneuvers   CV:  RRR  no s3 or murmur or increase in P2, no edema   ABD:  soft and nontender with nl excursion in the supine position. No bruits or organomegaly, bowel sounds nl  MS:  warm without deformities, calf tenderness, cyanosis or clubbing  SKIN: warm and dry without lesions    NEURO:  alert, approp, no deficits                 Assessment:        Outpatient Encounter Prescriptions as of 07/08/2015  Medication Sig  . acetaminophen-codeine (TYLENOL #3) 300-30 MG  tablet Take 1 tablet by mouth every 6 (six) hours as needed. for pain  . albuterol (PROVENTIL) (2.5 MG/3ML) 0.083% nebulizer solution Take 6 mLs (5 mg total) by nebulization every 6 (six) hours as needed for wheezing or shortness of breath.  . [DISCONTINUED] Fluticasone Furoate-Vilanterol (BREO ELLIPTA) 100-25 MCG/INH AEPB Inhale 1 puff into the lungs every morning.  Marland Kitchen. albuterol (PROAIR HFA) 108 (90 Base) MCG/ACT inhaler 2 puffs every 4 hours as needed only  if your can't catch your breath  . mometasone-formoterol (DULERA) 100-5 MCG/ACT AERO Take 2 puffs first thing in am and then another 2 puffs about 12 hours later.  . [DISCONTINUED] EPINEPHrine 0.3 mg/0.3 mL IJ SOAJ injection Inject 0.3 mg into the muscle daily as needed (allergic reaction).   No facility-administered encounter medications on file as of 07/08/2015.

## 2015-07-08 NOTE — Patient Instructions (Signed)
Plan A = automatic = dulera 100 Take 2 puffs first thing in am and then another 2 puffs about 12 hours later.    Plan B = backup Only use your albuterol(proair) as a rescue medication to be used if you can't catch your breath by resting or doing a relaxed purse lip breathing pattern.  - The less you use it, the better it will work when you need it. - Ok to use up to 2 puffs  every 4 hours if you must but call for immediate appointment if use goes up over your usual need - Don't leave home without it !!  (think of it like the spare tire for your car)    Plan C = Crisis Ok to use nebulizer with albuterol up to every 4 hours if plan B didn't but call right away for appointment    If you are satisfied with your treatment plan,  let your doctor know and he/she can either refill your medications or you can return here when your prescription runs out.     If in any way you are not 100% satisfied,  please tell us.  If 100% better, tell your friends!  Pulmonary follow up is as needed

## 2015-07-09 ENCOUNTER — Encounter: Payer: Self-pay | Admitting: Internal Medicine

## 2015-07-09 NOTE — Assessment & Plan Note (Signed)
Old records obtained:  IgE 1877, RAST very abnormal 2007. 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 Acute exacerbation/hospitalization 03/2012 Arlyce Harman 04/2012:  FEV1 1.72 (58%), ratio 68 No response to singulair.  - changed advair to [facility] 12/15/2014 > changed to Dulera 100 due to insurance restrictions 07/08/2015  - 07/08/2015  extensive coaching HFA effectiveness =    75%   I had an extended final summary discussion with the patient reviewing all relevant studies completed to date and  lasting 15 to 20 minutes of a 25 minute visit on the following issues:    All goals of chronic asthma control met including optimal function and elimination of symptoms with minimal need for rescue therapy.  Contingencies discussed in full including contacting this office immediately if not controlling the symptoms using the rule of two's.  Formulary restrictions will be an ongoing challenge for the forseable future and I would be happy to pick an alternative if the pt will first  provide me a list of them but pt  will need to return here for training for any new device that is required eg dpi vs hfa vs respimat.    In meantime we can always provide samples so the patient never runs out of any needed respiratory medications.    Try dulera 100 2bid as on formulary > f/u is prn

## 2015-09-12 ENCOUNTER — Telehealth: Payer: Self-pay | Admitting: Internal Medicine

## 2015-09-12 NOTE — Telephone Encounter (Signed)
Spoke with pt, requesting dulera 100 samples.  Per pt, states her pharmacy has made several attempts to contact us regarding her insurance not covering this medication.   Pt given 2 samples of dulera.   [facility] [address], verified that insurance will not cover dulera- does not give any covered alternatives or option to initiate a PA.  Pharmacist tried running anoro, breo, and symbicort-none of which were covered by insurance.   Looked up pt's 2017 formulary online, states that dulera is in fact a preferred medication.  Called pharmacy again, verified that they have correct insurance info for pt- after reviewing this it was discovered that pt only has rx coverage for family planning medications-no inhaled medications will be covered with this plan.  lmtcb X1 to make pt aware.

## 2015-09-14 NOTE — Telephone Encounter (Signed)
Spoke with pt and she has already spoken with ins co and is aware of prescription coverage limitations. Pt was told that she needed to update/reapply for prescription coverage. Pt states she is doing that immediately and will keep us advised of coverage. Nothing further needed at this time.

## 2015-09-21 ENCOUNTER — Telehealth: Payer: Self-pay | Admitting: Internal Medicine

## 2015-09-21 NOTE — Telephone Encounter (Signed)
Spoke with pt and she states that due to a new law her MCD now only covers [employer]. She is needing pt assistance forms for her medications. Dulera Pt Assistance forms printed and at front desk for pt to pick up. Pt aware.

## 2015-09-27 ENCOUNTER — Telehealth: Payer: Self-pay

## 2015-09-27 NOTE — Telephone Encounter (Signed)
April I need this patients daily record it is 02/08/15 for a legal disability claim. If you could just place it in the box at the checkout desk i will get it thank you!

## 2015-09-27 NOTE — Telephone Encounter (Signed)
Done

## 2015-10-10 ENCOUNTER — Telehealth: Payer: Self-pay | Admitting: Internal Medicine

## 2015-10-10 NOTE — Telephone Encounter (Signed)
Form received and placed in MW's lookat Message routed to [facility] and [place]lie

## 2015-10-10 NOTE — Telephone Encounter (Signed)
Placed in MW's lookat 

## 2015-10-14 NOTE — Telephone Encounter (Signed)
Already done 4/17 and placed in scan folder

## 2015-10-14 NOTE — Telephone Encounter (Signed)
Verlon Au[place]lie - has form been completed?  Please advise.

## 2015-10-31 ENCOUNTER — Telehealth: Payer: Self-pay | Admitting: Internal Medicine

## 2015-10-31 NOTE — Telephone Encounter (Signed)
Spoke with pt. She is following up on her patient assistance forms. Advised her that patient assistance could take several weeks to be processed. Her forms were submitted 2 weeks ago per Leslie's documentation. She will need samples to hold her over. These have been left at the front desk. Nothing further was needed at this time.

## 2015-12-01 ENCOUNTER — Telehealth: Payer: Self-pay | Admitting: Internal Medicine

## 2015-12-01 NOTE — Telephone Encounter (Signed)
Pt was not calling for results  She was calling to ask about pt assistance update  I advised that she contact the pt assistance program since she states she has not done so already  Nothing further needed

## 2015-12-12 ENCOUNTER — Telehealth: Payer: Self-pay | Admitting: Internal Medicine

## 2015-12-12 NOTE — Telephone Encounter (Signed)
Spoke with pt. She is needing samples of Dulera 100. These have been left at the front desk for pick up. Nothing further was needed.

## 2016-01-16 ENCOUNTER — Telehealth: Payer: Self-pay | Admitting: Internal Medicine

## 2016-01-16 NOTE — Telephone Encounter (Signed)
Spoke with pt and she still has not heard anything from [facility] pt assistance.  Called [employer] pt assist program and they do not have anything on file for this pt.  I notified that pt that she will need to come by and pick up form to fill out so we can resend to [employer].  Pt verbalized understanding.  She will come by tomorrow to pick up forms along with samples of Dulera.  Form and samples left at front desk.

## 2016-02-21 ENCOUNTER — Telehealth: Payer: Self-pay | Admitting: Internal Medicine

## 2016-02-21 NOTE — Telephone Encounter (Signed)
Form in MW's lookat to be signed

## 2016-02-21 NOTE — Telephone Encounter (Signed)
done

## 2016-02-21 NOTE — Telephone Encounter (Signed)
Forms were already placed in MW look at. Will forward to leslie to document once done.

## 2016-02-22 NOTE — Telephone Encounter (Signed)
Spoke with Verlon [place]lie, MW is still needing to sign a portion of the pt assistance form. Will route to [place] to follow.

## 2016-02-28 NOTE — Telephone Encounter (Signed)
Verlon AuLeslie please advise if these patient assistance forms have been completed. Thanks.

## 2016-02-28 NOTE — Telephone Encounter (Signed)
Forms completed and faxed.  

## 2016-03-02 ENCOUNTER — Telehealth: Payer: Self-pay | Admitting: Internal Medicine

## 2016-03-02 MED ORDER — ALBUTEROL SULFATE (2.5 MG/3ML) 0.083% IN NEBU: 5.0000 mg | INHALATION_SOLUTION | Freq: Four times a day (QID) | RESPIRATORY_TRACT | 1 refills | Status: DC | PRN

## 2016-03-02 NOTE — Telephone Encounter (Signed)
Spoke with patient-she is aware that [facility] to [employer] on 02-28-16; forms have been sent to scan in EPIC chart.   Verlon AuLeslie will keep an eye out next week for forms to be scanned in and then re-fax to [employer]. Pt was also given number to [facility] to contact again on Monday to ensure they have her forms as they have a lag in processing faxed forms.

## 2016-03-02 NOTE — Telephone Encounter (Signed)
Pt calling again for Dulera samples. Requests call back asap.

## 2016-03-02 NOTE — Telephone Encounter (Signed)
Pt is aware that we do not have any samples of Dulera at this time. She is aware that I will send her albuterol neb solution to [facility] [address] location. Nothing more needed at this time.

## 2016-03-02 NOTE — Telephone Encounter (Signed)
Pt requesting samples of Dulera 100-pt is currently in process of pt assistance application. [facility] [address]

## 2016-03-27 ENCOUNTER — Ambulatory Visit (INDEPENDENT_AMBULATORY_CARE_PROVIDER_SITE_OTHER): Payer: Medicaid Other | Admitting: Adult Health

## 2016-03-27 ENCOUNTER — Telehealth: Payer: Self-pay | Admitting: Internal Medicine

## 2016-03-27 ENCOUNTER — Encounter: Payer: Self-pay | Admitting: Adult Health

## 2016-03-27 DIAGNOSIS — Z23 Encounter for immunization: Secondary | ICD-10-CM | POA: Diagnosis not present

## 2016-03-27 DIAGNOSIS — J4531 Mild persistent asthma with (acute) exacerbation: Secondary | ICD-10-CM

## 2016-03-27 MED ORDER — MOMETASONE FURO-FORMOTEROL FUM 100-5 MCG/ACT IN AERO: INHALATION_SPRAY | RESPIRATORY_TRACT | 5 refills | Status: DC

## 2016-03-27 NOTE — Telephone Encounter (Signed)
lmomtcb x 1 for the pt to call back to verify the correct pharmacy.

## 2016-03-27 NOTE — Progress Notes (Signed)
Subjective:     Patient ID: Crystal Sampson, female   DOB: 08/03/1975,   MRN: [id number]  HPI  [age] yobf prev followed by Dr Shelle Ironlance seen in pulmonary clinic 12/14/2014 for recurrent L cp.   12/14/2014 f/u ov/Wert re: L cp Chief Complaint  Patient presents with  . Acute Visit    Pt c/o left side chest and back pain for the past wk.  Her breathing is unchanged since her last visit.   in the past sev years, recurrent pain in same location lasts for sev days goes away with tums but this time is the worst ever started a week prior to OV abuptly  And nothing has helped except [facility] 12[facility]10[facility]14  Pain is "real sharp" comes in waves, best in am and worst in sitting position, ant > post but diffuse and no assoc sob, diaph n or v  rec Classic subdiaphragmatic pain pattern suggests ibs:    Treatment consists of avoiding foods that cause gas (especially [place] food, boiled eggs/ beans and raw vegetables like spinach and salads)  and citrucel 1 heaping tsp twice daily with a large glass of water.  Pain should improve w/in 2 weeks and if not then consider further GI work up. BREO one puff each am > fill the prescription if you like it If you start needing the nebulizer more than very rarely we need to see you back here right away   07/08/2015  f/u ov/Wert re: asthma better on breo /  Has neb but not using  Chief Complaint  Patient presents with  . Follow-up    Pt states that her breathing is doing well. Pt denies cough/wheeze/SOB/CP/tightness. Pt states that her asthma seems well controlled on the Breo.    insurance not paying for BREO but she's doing well. Not limited by breathing from desired activities   >>[facility]  03/27/2016 Follow up ; Asthma  Patient presents for a follow-up for asthma. Patient says that she has been doing exceptionally well with her asthma. Up until 5 days ago. She [facility], she is starting to have more asthma symptoms with intermittent wheezing. Had  to use her albuterol inhaler. . He says her insurance has recently changed and not sure if it will cover Dulera. She is hoping it will because it has worked the best.  She denies any fever, hemoptysis, orthopnea, PND, discolored mucus.    Current Medications, Allergies, Complete Past Medical History, Past Surgical History, Family History, and Social History were reviewed in [facility] electronic medical record.  ROS  The following are not active complaints unless bolded sore throat, dysphagia, dental problems, itching, sneezing,  nasal congestion or excess/ purulent secretions, ear ache,   fever, chills, sweats, unintended wt loss,   hemoptysis,  orthopnea pnd or leg swelling, presyncope, palpitations, abdominal pain, anorexia, nausea, vomiting, diarrhea  or change in bowel or urinary habits, change in stools or urine, dysuria,hematuria,  rash, arthralgias, visual complaints, headache, numbness weakness or ataxia or problems with walking or coordination,  change in mood/affect or memory.           Objective:   Physical Exam  amb obese wf nad Vitals:   03/27/16 1455  BP: 134/86  Pulse: 78  Temp: 98.1 F (36.7 C)  TempSrc: Oral  SpO2: 98%  Weight: 178 lb (80.7 kg)  Height: 5\' 8"  (1.727 m)      Vital signs reviewed   HEENT: nl dentition,  turbinates, and orophanx. Nl external ear canals without cough reflex   NECK :  without JVD/Nodes/TM/ nl carotid upstrokes bilaterally   LUNGS: no acc muscle use, clear to A and P bilaterally without cough on insp or exp maneuvers   CV:  RRR  no s3 or murmur or increase in P2, no edema   ABD:  soft and nontender with nl excursion in the supine position. No bruits or organomegaly, bowel sounds nl  MS:  warm without deformities, calf tenderness, cyanosis or clubbing  SKIN: warm and dry without lesions    NEURO:  alert, approp, no deficits     Tammy Parrett NP-C  [place] [facility]  03/27/2016

## 2016-03-27 NOTE — Progress Notes (Signed)
Chart and office note reviewed in detail a - note she can get symbicort free regardless of insurance restrictions as long as not on a medicare or medicaid plan  > agree with a/p as outlined

## 2016-03-27 NOTE — Patient Instructions (Signed)
Restart Dulera 100  2 puffs Twice daily  , rinse after use.  Will send prescription to pharmacy, if not covered can look at patient assistance program. If this does not work will need to find alternative.  Flu shot today  follow up Dr. Sherene SiresWert  In 3 months and As needed   Please contact office for sooner follow up if symptoms do not improve or worsen or seek emergency care

## 2016-03-27 NOTE — Assessment & Plan Note (Signed)
Mild flare off Dulera   Plan  Patient Instructions  Restart Dulera 100  2 puffs Twice daily  , rinse after use.  Will send prescription to pharmacy, if not covered can look at patient assistance program. If this does not work will need to find alternative.  Flu shot today  follow up Dr. Sherene SiresWert  In 3 months and As needed   Please contact office for sooner follow up if symptoms do not improve or worsen or seek emergency care

## 2016-03-28 MED ORDER — MOMETASONE FURO-FORMOTEROL FUM 100-5 MCG/ACT IN AERO: INHALATION_SPRAY | RESPIRATORY_TRACT | 5 refills | Status: DC

## 2016-03-28 NOTE — Telephone Encounter (Signed)
lmtcb x1 for pt. 

## 2016-03-28 NOTE — Telephone Encounter (Signed)
Pt will need to have an order for nebulizer machine sent to a local DME.  TP please advise if ok to send order in.  thanks

## 2016-03-28 NOTE — Telephone Encounter (Signed)
Yes please send  Thanks  [facility] ???

## 2016-03-29 NOTE — Telephone Encounter (Signed)
lmtcb x2 for pt. 

## 2016-03-30 NOTE — Telephone Encounter (Signed)
LMOM TCB x3 Per office protocol, will sign off and a new message can be generated when pt returns call

## 2016-04-17 ENCOUNTER — Telehealth: Payer: Self-pay | Admitting: Internal Medicine

## 2016-04-17 DIAGNOSIS — J4540 Moderate persistent asthma, uncomplicated: Secondary | ICD-10-CM

## 2016-04-17 NOTE — Telephone Encounter (Signed)
Called and spoke with pt and she is aware of order placed for her to be set up with [facility]  Nothing further is needed.

## 2016-06-27 ENCOUNTER — Ambulatory Visit: Payer: Self-pay | Admitting: Internal Medicine

## 2016-07-17 ENCOUNTER — Encounter: Payer: Self-pay | Admitting: Adult Health

## 2016-07-17 ENCOUNTER — Ambulatory Visit (INDEPENDENT_AMBULATORY_CARE_PROVIDER_SITE_OTHER): Payer: Self-pay | Admitting: Adult Health

## 2016-07-17 DIAGNOSIS — J302 Other seasonal allergic rhinitis: Secondary | ICD-10-CM

## 2016-07-17 DIAGNOSIS — J4540 Moderate persistent asthma, uncomplicated: Secondary | ICD-10-CM

## 2016-07-17 DIAGNOSIS — J3089 Other allergic rhinitis: Secondary | ICD-10-CM

## 2016-07-17 MED ORDER — MOMETASONE FURO-FORMOTEROL FUM 100-5 MCG/ACT IN AERO: INHALATION_SPRAY | RESPIRATORY_TRACT | 0 refills | Status: DC

## 2016-07-17 NOTE — Assessment & Plan Note (Signed)
Well controlled without flare   Plan  Patient Instructions  Continue on Dulera 2 puffs Twice daily  , rinse after use.  Saline nasal rinses As needed   Begin Flonase 1 puff Twice daily   Follow up with Dr. Sherene SiresWert  In 6 months and As needed

## 2016-07-17 NOTE — Addendum Note (Signed)
Addended by: Abigail MiyamotoPHELPS, Rayford Williamsen D on: 07/17/2016 10:26 AM   Modules accepted: Orders

## 2016-07-17 NOTE — Progress Notes (Signed)
Chart and office note reviewed in detail  > agree with a/p as outlined    

## 2016-07-17 NOTE — Progress Notes (Signed)
 @Patient  ID: Crystal PeanBelinda G Greenstreet, female    DOB: 01/14/1976, 41 y.o.   MRN: [id number]  Chief Complaint  Patient presents with  . Follow-up    Asthma     Referring provider: No ref. provider found  HPI: [age] yobf followed for asthma and AR , high IgE  TEST Old records obtained:  IgE 1877, RAST very abnormal 2007. 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 Acute exacerbation/hospitalization 03/2012 Cleda DaubSpiro 04/2012:  FEV1 1.72 (58%), ratio 68 No response to singulair.  - [facility] 12/15[facility] > changed to Dulera 100 due to insurance restrictions 07/08/2015  Allergy Profile 07/17/2012-total IgE 1144.9 with elevation of all potential allergens on the panel Allergy Skin Test- 03/25/13- significant positive for grass weed and tree pollens dust mite, cat AP 03/20/2004-total IgE 1977.5 with broad elevation of almost all allergens and extremely high level against dog dander IgE 01/04/2006-1877.5 Aspergillus fumigatus CF 01/04/06 <1:8 Allergy Profile 07/17/2012-total IgE 1144.9 with elevation of all potential allergens on the panel  07/17/2016 follow-up asthma. Patient returns for a three-month follow-up. Patient says she is doing well with her asthma. She denies any flare of wheezing or cough. Patient does get Dulera through patient assistance program. She says that she will be changing her insurance soon and would like her patient assistance papers filled back out. She does complain of some nasal drip and stuffiness intermittently. She's been using some over-the-counter nasal sprays. Denies any fever or discolored mucus, chest pain, orthopnea, PND, or increased leg swelling. She denies any increased use of albuterol.  Allergies  Allergen Reactions  . Penicillins Swelling    Immunization History  Administered Date(s) Administered  . Influenza Split 03/25/2012  . Influenza,inj,Quad PF,36+ Mos 03/25/2013, 03/27/2016  . Pneumococcal Polysaccharide-23 03/25/2012    Past  Medical History:  Diagnosis Date  . Asthma   . Depression    [facility], quit [facility] meds  . 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 Never Smoker  Smokeless Tobacco  . Never Used   Counseling given: Not Answered   Outpatient Encounter Prescriptions as of 07/17/2016  Medication Sig  . albuterol (PROAIR HFA) 108 (90 Base) MCG/ACT inhaler 2 puffs every 4 hours as needed only  if your can't catch your breath  . albuterol (PROVENTIL) (2.5 MG/3ML) 0.083% nebulizer solution Take 6 mLs (5 mg total) by nebulization every 6 (six) hours as needed for wheezing or shortness of breath.  . mometasone-formoterol (DULERA) 100-5 MCG/ACT AERO Take 2 puffs first thing in am and then another 2 puffs about 12 hours later.  Marland Kitchen. acetaminophen-codeine (TYLENOL #3) 300-30 MG tablet Take 1 tablet by mouth every 6 (six) hours as needed. for pain   No facility-administered encounter medications on file as of 07/17/2016.      Review of Systems  Constitutional:   No  weight loss, night sweats,  Fevers, chills, fatigue, or  lassitude.  HEENT:   No headaches,  Difficulty swallowing,  Tooth/dental problems, or  Sore throat,                No sneezing, itching, ear ache,  +nasal congestion, post nasal drip,   CV:  No chest pain,  Orthopnea, PND, swelling in lower extremities, anasarca, dizziness, palpitations, syncope.   GI  No heartburn, indigestion, abdominal pain, nausea, vomiting, diarrhea, change in bowel habits, loss of appetite, bloody stools.   Resp: No  shortness of breath with exertion or at rest.  No excess mucus, no productive cough,  No non-productive cough,  No coughing up of blood.  No change in color of mucus.  No wheezing.  No chest wall deformity  Skin: no rash or lesions.  GU: no dysuria, change in color of urine, no urgency or frequency.  No flank pain, no hematuria   MS:  No joint pain or swelling.  No decreased range  of motion.  No back pain.    Physical Exam  BP 120/60   Pulse 68   Ht 5\' 8"  (1.727 m)   Wt 208 lb 3.2 oz (94.4 kg)   SpO2 97%   BMI 31.66 kg/m   GEN: A/Ox3; pleasant , NAD, well nourished    HEENT:  [place]/AT,  EACs-clear, TMs-wnl, NOSE-clear drainage  THROAT-clear, no lesions, no postnasal drip or exudate noted.   NECK:  Supple w/ fair ROM; no JVD; normal carotid impulses w/o bruits; no thyromegaly or nodules palpated; no lymphadenopathy.    RESP  Clear  P & A; w/o, wheezes/ rales/ or rhonchi. no accessory muscle use, no dullness to percussion  CARD:  RRR, no m/r/g, no peripheral edema, pulses intact, no cyanosis or clubbing.  GI:   Soft & nt; nml bowel sounds; no organomegaly or masses detected.   Musco: Warm bil, no deformities or joint swelling noted.   Neuro: alert, no focal deficits noted.    Skin: Warm, no lesions or rashes  Psych:  No change in mood or affect. No depression or anxiety.  No memory loss.  Lab Results:  CBC    Component Value Date/Time   WBC 8.6 12/10/2014 0604   RBC 4.40 12/10/2014 0604   HGB 13.5 12/10/2014 0604   HCT 39.7 12/10/2014 0604   PLT 363 12/10/2014 0604   MCV 90.2 12/10/2014 0604   MCH 30.7 12/10/2014 0604   MCHC 34.0 12/10/2014 0604   RDW 12.7 12/10/2014 0604   LYMPHSABS 2.1 10/30/2012 0830   MONOABS 1.0 10/30/2012 0830   EOSABS 0.3 10/30/2012 0830   BASOSABS 0.1 10/30/2012 0830    BMET    Component Value Date/Time   NA 137 12/10/2014 0604   K 4.3 12/10/2014 0604   CL 106 12/10/2014 0604   CO2 23 12/10/2014 0604   GLUCOSE 94 12/10/2014 0604   BUN 9 12/10/2014 0604   CREATININE 0.79 12/10/2014 0604   CALCIUM 9.1 12/10/2014 0604   GFRNONAA >60 12/10/2014 0604   GFRAA >60 12/10/2014 0604    BNP    Component Value Date/Time   BNP 6.6 12/10/2014 0604    ProBNP No results found for: PROBNP  Imaging: No results found.   Assessment & Plan:   Moderate persistent asthma in adult without complication Well  controlled without flare   Plan  Patient Instructions  Continue on Dulera 2 puffs Twice daily  , rinse after use.  Saline nasal rinses As needed   Begin Flonase 1 puff Twice daily   Follow up with Dr. Sherene Sires  In 6 months and As needed      Seasonal and perennial allergic rhinitis Add Flonase   Plan  Patient Instructions  Continue on Dulera 2 puffs Twice daily  , rinse after use.  Saline nasal rinses As needed   Begin Flonase 1 puff Twice daily   Follow up with Dr. Sherene Sires  In 6 months and As needed         Rubye Oaks, NP 07/17/2016

## 2016-07-17 NOTE — Assessment & Plan Note (Signed)
Add Flonase   Plan  Patient Instructions  Continue on Dulera 2 puffs Twice daily  , rinse after use.  Saline nasal rinses As needed   Begin Flonase 1 puff Twice daily   Follow up with Dr. Sherene SiresWert  In 6 months and As needed

## 2016-07-17 NOTE — Patient Instructions (Addendum)
Continue on Dulera 2 puffs Twice daily  , rinse after use.  Saline nasal rinses As needed   Begin Flonase 1 puff Twice daily   Follow up with Dr. Sherene SiresWert  In 6 months and As needed

## 2016-09-11 ENCOUNTER — Telehealth: Payer: Self-pay | Admitting: Internal Medicine

## 2016-09-11 NOTE — Telephone Encounter (Signed)
lmtcb X1 for pt  

## 2016-09-13 NOTE — Telephone Encounter (Signed)
lmtcb x2 for pt. 

## 2016-09-14 NOTE — Telephone Encounter (Signed)
Pt also stated that the dulera needs to be sent to  [facility] [address] [place], [place] [id number] [phone]

## 2016-09-14 NOTE — Telephone Encounter (Signed)
Called and spoke with pt and she stated that she can come back by and fill the forms out for the pt assistance for the dulera.  Advised the pt that she will need to ask to speak to someone about the forms.

## 2016-09-14 NOTE — Telephone Encounter (Signed)
Patient waiting in lobby about forms.Charm RingsErica R Sampson

## 2016-09-14 NOTE — Telephone Encounter (Signed)
Went out and spoke with pt and she was given the pt assistance forms for the dulera.  She is aware that these will need to be filled out and brought back to our office with her yearly income papers.  Pt voiced her understanding and nothing further is needed.

## 2016-09-20 ENCOUNTER — Telehealth: Payer: Self-pay | Admitting: Internal Medicine

## 2016-09-20 DIAGNOSIS — J4540 Moderate persistent asthma, uncomplicated: Secondary | ICD-10-CM

## 2016-09-20 MED ORDER — MOMETASONE FURO-FORMOTEROL FUM 100-5 MCG/ACT IN AERO: INHALATION_SPRAY | RESPIRATORY_TRACT | 3 refills | Status: DC

## 2016-09-20 NOTE — Telephone Encounter (Signed)
Forms placed in MW's lookat to be signed

## 2016-09-20 NOTE — Telephone Encounter (Signed)
Done

## 2016-09-24 NOTE — Telephone Encounter (Signed)
Verlon Au have you received these forms?

## 2016-09-25 NOTE — Telephone Encounter (Signed)
Forms filled out and mailed to [employer] and already sent to be scanned  Pt aware

## 2017-01-14 ENCOUNTER — Ambulatory Visit: Payer: Self-pay | Admitting: Internal Medicine

## 2017-02-11 ENCOUNTER — Telehealth: Payer: Self-pay | Admitting: Internal Medicine

## 2017-02-11 NOTE — Telephone Encounter (Signed)
Patient assistance forms have been placed up front in brown folder, for pt to fill out during her OV. Lm to make pt aware.

## 2017-02-12 NOTE — Telephone Encounter (Signed)
LR pt has an appt with MW on 8/23 and there are forms in the brown folder up front that the pt will need to fill out.  They left a message for the pt to make her aware.  Just FYI for you.  Thanks

## 2017-02-12 NOTE — Telephone Encounter (Signed)
I have the forms at my desk and will complete when she comes in for ov- want to ensure MW will keep her on the [facility]

## 2017-02-14 ENCOUNTER — Ambulatory Visit (INDEPENDENT_AMBULATORY_CARE_PROVIDER_SITE_OTHER): Payer: Self-pay | Admitting: Internal Medicine

## 2017-02-14 ENCOUNTER — Telehealth: Payer: Self-pay | Admitting: Internal Medicine

## 2017-02-14 ENCOUNTER — Encounter: Payer: Self-pay | Admitting: Internal Medicine

## 2017-02-14 VITALS — BP 124/88 | HR 81 | Ht 68.0 in | Wt 204.8 lb

## 2017-02-14 DIAGNOSIS — J4540 Moderate persistent asthma, uncomplicated: Secondary | ICD-10-CM

## 2017-02-14 MED ORDER — MOMETASONE FURO-FORMOTEROL FUM 100-5 MCG/ACT IN AERO: 2.0000 | INHALATION_SPRAY | Freq: Two times a day (BID) | RESPIRATORY_TRACT | 0 refills | Status: DC

## 2017-02-14 MED ORDER — MOMETASONE FURO-FORMOTEROL FUM 100-5 MCG/ACT IN AERO: INHALATION_SPRAY | RESPIRATORY_TRACT | 3 refills | Status: DC

## 2017-02-14 NOTE — Telephone Encounter (Signed)
Forms mailed to [facility] pt assistance

## 2017-02-14 NOTE — Telephone Encounter (Signed)
Forms received and paced in MW's cubby.  Will forward to LR to follow up on.

## 2017-02-14 NOTE — Assessment & Plan Note (Signed)
AP 03/20/2004  IgE 1877, RAST very abnormal 2007. 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 Acute exacerbation/hospitalization 03/2012 Arlyce Harman 04/2012:  FEV1 1.72 (58%), ratio 68 No response to singulair.  - changed advair to [facility] 12/15/2014 > changed to Dulera 100 due to insurance restrictions 07/08/2015   - 02/14/2017  extensive coaching HFA effectiveness =    75%  From a baseline of 25%  Despite very poor baseline hfa and insight into asthma (does not carry rescue) and obstacles to acquiring maint rx in setting of very impressive allergy profile >  All goals of chronic asthma control met including optimal function and elimination of symptoms with minimal need for rescue therapy.  Contingencies discussed in full including contacting this office immediately if not controlling the symptoms using the rule of two's.     Reviewed rule of 2s If your breathing worsens or you need to use your rescue inhaler more than twice weekly or wake up more than twice a month with any respiratory symptoms or require more than two rescue inhalers per year, we need to see you right away because this means we're not controlling the underlying problem (inflammation) adequately.  Rescue inhalers do not control inflammation and overuse can lead to unnecessary and costly consequences.  They can make you feel better temporarily but eventually they will quit working effectively much as sleep aids lead to more insomnia if used regularly.    I had an extended discussion with the patient reviewing all relevant studies completed to date and  lasting 15 to 20 minutes of a 25 minute visit    Each maintenance medication was reviewed in detail including most importantly the difference between maintenance and prns and under what circumstances the prns are to be triggered using an action plan format that is not reflected in the computer generated alphabetically organized AVS.    Please see AVS for specific  instructions unique to this visit that I personally wrote and verbalized to the the pt in detail and then reviewed with pt  by my nurse highlighting any  changes in therapy recommended at today's visit to their plan of care.

## 2017-02-14 NOTE — Telephone Encounter (Signed)
Crystal Sampson filled out the forms today

## 2017-02-14 NOTE — Patient Instructions (Signed)
Plan A = Automatic =  Dulera 100 Take 2 puffs first thing in am and then another 2 puffs about 12 hours later.   Work on inhaler technique:  relax and gently blow all the way out then take a nice smooth deep breath back in, triggering the inhaler at same time you start breathing in.  Hold for up to 5 seconds if you can. Blow out thru nose. Rinse and gargle with water when done      Plan B = Backup Only use your albuterol (ventolin) as a rescue medication to be used if you can't catch your breath by resting or doing a relaxed purse lip breathing pattern.  - The less you use it, the better it will work when you need it. - Ok to use the inhaler up to 2 puffs  every 4 hours if you must but call for appointment if use goes up over your usual need - Don't leave home without it !!  (think of it like the spare tire for your car)    Plan C = Crisis - only use your albuterol nebulizer if you first try Plan B and it fails to help > ok to use the nebulizer up to every 4 hours but if start needing it regularly call for immediate appointment    Please schedule a follow up visit in 3 months but call sooner if needed and bring your inhalers with you

## 2017-02-14 NOTE — Progress Notes (Signed)
@Patient  ID: Crystal Sampson, female    DOB: 09-Mar-1976 .   MRN: [id number]      HPI: 69 yobf followed for asthma and AR , high IgE  Studies :    Crystal Sampson 2001: FEV1 2.38 (68%), ratio 80.  AP 03/20/2004-total IgE 1977.5 with broad elevation of almost all allergens and extremely high level against dog dander Crystal Sampson 04/2012:  FEV1 1.72 (58%), ratio 68 No response to singulair.  - changed advair to [facility] 12/15/2014 > changed to Dulera 100 due to insurance restrictions 07/08/2015  Allergy Profile 07/17/2012-total IgE 1144.9 with elevation of all potential allergens on the panel Allergy Skin Test- 03/25/13- significant positive for grass weed and tree pollens dust mite, cat   07/17/2016 follow-up asthma. NP  Patient returns for a three-month follow-up. Patient says she is doing well with her asthma. She denies any flare of wheezing or cough. Patient does get Dulera through patient assistance program. She says that she will be changing her insurance soon and would like her patient assistance papers filled back out. She does complain of some nasal drip and stuffiness intermittently. She's been using some over-the-counter nasal sprays. Denies any fever or discolored mucus, chest pain, orthopnea, PND, or increased leg swelling. She denies any increased use of albuterol. rec Continue on Dulera 2 puffs Twice daily  , rinse after use.  Saline nasal rinses As needed   Begin Flonase 1 puff Twice daily     02/14/2017  f/u ov/Crystal Sampson re:  Chief Complaint  Patient presents with  . Follow-up    Breathing is overall doing well. She rarely uses albuterol inhaler or neb.    despite very poor and inconsistent hfa technique no daytime or noct symptoms or need for hfa  No obvious day to day or daytime variability or assoc excess/ purulent sputum or mucus plugs or hemoptysis or cp or chest tightness, subjective wheeze or overt sinus or hb symptoms. No unusual exp hx or h/o childhood pna/ asthma or knowledge of  premature birth.  Sleeping ok without nocturnal  or early am exacerbation  of respiratory  c/o's or need for noct saba. Also denies any obvious fluctuation of symptoms with weather or environmental changes or other aggravating or alleviating factors except as outlined above   Current Medications, Allergies, Complete Past Medical History, Past Surgical History, Family History, and Social History were reviewed in Crystal Sampson record.  ROS  The following are not active complaints unless bolded sore throat, dysphagia, dental problems, itching, sneezing,  nasal congestion or excess/ purulent secretions, ear ache,   fever, chills, sweats, unintended wt loss, classically pleuritic or exertional cp,  orthopnea pnd or leg swelling, presyncope, palpitations, abdominal pain, anorexia, nausea, vomiting, diarrhea  or change in bowel or bladder habits, change in stools or urine, dysuria,hematuria,  rash, arthralgias, visual complaints, headache, numbness, weakness or ataxia or problems with walking or coordination,  change in mood/affect or memory.                 Physical Exam  amb bf nad  Wt Readings from Last 3 Encounters:  02/14/17 204 lb 12.8 oz (92.9 kg)  07/17/16 208 lb 3.2 oz (94.4 kg)  03/27/16 178 lb (80.7 kg)    Vital signs reviewed   - Note on arrival 02 sats  98% on RA     HEENT: nl dentition, turbinates bilaterally, and oropharynx. Nl external ear canals without cough reflex   NECK :  without JVD/Nodes/TM/ nl carotid  upstrokes bilaterally   LUNGS: no acc muscle use,  Nl contour chest which is clear to A and P bilaterally without cough on insp or exp maneuvers   CV:  RRR  no s3 or murmur or increase in P2, and no edema   ABD:  soft and nontender with nl inspiratory excursion in the supine position. No bruits or organomegaly appreciated, bowel sounds nl  MS:  Nl gait/ ext warm without deformities, calf tenderness, cyanosis or clubbing No obvious joint  restrictions   SKIN: warm and dry without lesions    NEURO:  alert, approp, nl sensorium with  no motor or cerebellar deficits apparent.        Assessment & Plan:

## 2017-03-11 ENCOUNTER — Telehealth: Payer: Self-pay | Admitting: Internal Medicine

## 2017-03-11 MED ORDER — ALBUTEROL SULFATE (2.5 MG/3ML) 0.083% IN NEBU: 5.0000 mg | INHALATION_SOLUTION | Freq: Four times a day (QID) | RESPIRATORY_TRACT | 5 refills | Status: DC | PRN

## 2017-03-11 NOTE — Telephone Encounter (Signed)
Pt requesting albuterol neb refill.  rx has been sent to preferred pharmacy.  Nothing further needed.

## 2017-03-13 ENCOUNTER — Ambulatory Visit: Payer: Self-pay | Admitting: Acute Care

## 2017-03-14 ENCOUNTER — Ambulatory Visit (INDEPENDENT_AMBULATORY_CARE_PROVIDER_SITE_OTHER): Payer: Self-pay | Admitting: Adult Health

## 2017-03-14 ENCOUNTER — Encounter: Payer: Self-pay | Admitting: Adult Health

## 2017-03-14 ENCOUNTER — Ambulatory Visit (INDEPENDENT_AMBULATORY_CARE_PROVIDER_SITE_OTHER)
Admission: RE | Admit: 2017-03-14 | Discharge: 2017-03-14 | Disposition: A | Discharge status: Home / Self Care | Payer: Self-pay | Source: Ambulatory Visit | Attending: Adult Health | Admitting: Adult Health

## 2017-03-14 VITALS — BP 112/74 | HR 85 | Ht 68.0 in | Wt 201.2 lb

## 2017-03-14 DIAGNOSIS — J4540 Moderate persistent asthma, uncomplicated: Secondary | ICD-10-CM

## 2017-03-14 DIAGNOSIS — F419 Anxiety disorder, unspecified: Secondary | ICD-10-CM

## 2017-03-14 LAB — NITRIC OXIDE: NITRIC OXIDE: 9

## 2017-03-14 NOTE — Progress Notes (Signed)
  ID: Crystal Sampson, female    DOB: 12-Jul-1975, 41 y.o.   MRN: [id number]  Chief Complaint  Patient presents with  . Acute Visit    Anxiety     Referring provider: No ref. provider found  HPI: HPI: 70  yobf followed for asthma and AR , high IgE  TEST Old records obtained: IgE 1877, RAST very abnormal 2007. Aspergillus CF <1:8 [name] 2001: FEV1 2.38 (68%), ratio 80. Acute exacerbation/hospitalization 03/2012 [name] 04/2012: FEV1 1.72 (58%), ratio 68 No response to singulair.  - changed advair to BREO 12/15/2014>changed to Dulera 100 due to insurance restrictions 07/08/2015 Allergy Profile 07/17/2012-total IgE 1144.9 with elevation of all potential allergens on the panel Allergy Skin Test- 03/25/13- significant positive for grass weed and tree pollens dust mite, cat AP 03/20/2004-total IgE 1977.5 with broad elevation of almost all allergens and extremely high level against dog dander IgE 01/04/2006-1877.5 Aspergillus fumigatus CF 01/04/06 <1:8 Allergy Profile 07/17/2012-total IgE 1144.9 with elevation of all potential allergens on the panel  03/14/2017 Acute OV : Anxiety  Pt presents for a work in visit. Complains that over last several months she has had increased anxiety , stress, worry, panic attacks and tightness in chest . She feels overwhelmed with taking caring of mother and kids. She says she works all the time and tired. She started to see therapist this week. She denies cough or wheezing.  Does wake up at night startled and stressed, feels like she cant catch her breath during these episodes. She denies suicidal or homicidal ideations. No exertional chest pain, orthopnea, edema , nv/d. She remains on [facility] Twice daily  . No increased SABA use.  FENO test today is 9. Spirometry shows FEV1  69%, ratio 74, FVC 76%. Similar to 2001.  We discussed stress reduction techniques.   Allergies  Allergen Reactions  . Penicillins Swelling    Immunization History    Administered Date(s) Administered  . Influenza Split 03/25/2012  . Influenza,inj,Quad PF,6+ Mos 03/25/2013, 03/27/2016  . Pneumococcal Polysaccharide-23 03/25/2012    Past Medical History:  Diagnosis Date  . Asthma   . Depression    goes to [facility], quit taking meds  . 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 Never Smoker  Smokeless Tobacco  . Never Used   Counseling given: Not Answered   Outpatient Encounter Prescriptions as of 03/14/2017  Medication Sig  . albuterol (PROAIR HFA) 108 (90 Base) MCG/ACT inhaler 2 puffs every 4 hours as needed only  if your can't catch your breath  . albuterol (PROVENTIL) (2.5 MG/3ML) 0.083% nebulizer solution Take 6 mLs (5 mg total) by nebulization every 6 (six) hours as needed for wheezing or shortness of breath. DX: J45.40  . mometasone-formoterol (DULERA) 100-5 MCG/ACT AERO Take 2 puffs first thing in am and then another 2 puffs about 12 hours later.   No facility-administered encounter medications on file as of 03/14/2017.      Review of Systems  Constitutional:   No  weight loss, night sweats,  Fevers, chills, fatigue, or  lassitude.  HEENT:   No headaches,  Difficulty swallowing,  Tooth/dental problems, or  Sore throat,                No sneezing, itching, ear ache, nasal congestion, post nasal drip,   CV:  No chest pain,  Orthopnea, PND, swelling in lower extremities, anasarca, dizziness, palpitations, syncope.   GI  No heartburn, indigestion, abdominal pain, nausea, vomiting, diarrhea, change in bowel habits, loss of appetite, bloody stools.   Resp:   No excess mucus, no productive cough,  No non-productive cough,  No coughing up of blood.  No change in color of mucus.  No wheezing.  No chest wall deformity  Skin: no rash or lesions.  GU: no dysuria, change in color of urine, no urgency or frequency.  No flank pain, no hematuria   MS:  No joint pain or  swelling.  No decreased range of motion.  No back pain.    Physical Exam  BP 112/74 (BP Location: Left Arm, Cuff Size: Normal)   Pulse 85   Ht  (1.727 m)   Wt 201 lb 3.2 oz (91.3 kg)   SpO2 98%   BMI 30.59 kg/m   GEN: A/Ox3; pleasant , NAD, well nourished    HEENT:  /AT,  EACs-clear, TMs-wnl, NOSE-clear, THROAT-clear, no lesions, no postnasal drip or exudate noted.   NECK:  Supple w/ fair ROM; no JVD; normal carotid impulses w/o bruits; no thyromegaly or nodules palpated; no lymphadenopathy.    RESP  Clear  P & A; w/o, wheezes/ rales/ or rhonchi. no accessory muscle use, no dullness to percussion  CARD:  RRR, no m/r/g, no peripheral edema, pulses intact, no cyanosis or clubbing.  GI:   Soft & nt; nml bowel sounds; no organomegaly or masses detected.   Musco: Warm bil, no deformities or joint swelling noted.   Neuro: alert, no focal deficits noted.    Skin: Warm, no lesions or rashes    Lab Results:  CBC    Component Value Date/Time   WBC 8.6 12/10/2014 0604   RBC 4.40 12/10/2014 0604   HGB 13.5 12/10/2014 0604   HCT 39.7 12/10/2014 0604   PLT 363 12/10/2014 0604   MCV 90.2 12/10/2014 0604   MCH 30.7 12/10/2014 0604   MCHC 34.0 12/10/2014 0604   RDW 12.7 12/10/2014 0604   LYMPHSABS 2.1 10/30/2012 0830   MONOABS 1.0 10/30/2012 0830   EOSABS 0.3 10/30/2012 0830   BASOSABS 0.1 10/30/2012 0830    BMET    Component Value Date/Time   NA 137 12/10/2014 0604   K 4.3 12/10/2014 0604   CL 106 12/10/2014 0604   CO2 23 12/10/2014 0604   GLUCOSE 94 12/10/2014 0604   BUN 9 12/10/2014 0604   CREATININE 0.79 12/10/2014 0604   CALCIUM 9.1 12/10/2014 0604   GFRNONAA >60 12/10/2014 0604   GFRAA >60 12/10/2014 0604    BNP    Component Value Date/Time   BNP 6.6 12/10/2014 0604    ProBNP No results found for: PROBNP  Imaging: No results found.   Assessment & Plan:   No problem-specific Assessment & Plan notes found for this encounter.     Rubye Oaks, NP 03/14/2017

## 2017-03-14 NOTE — Patient Instructions (Signed)
Chest xray and labs today .  Continue on Dulera 2 puffs Twice daily  , rinse after use.  Refer to Internal medicine to establish .  Continue to go to therapist .  Follow up with Dr. Sherene Sires  In 3 months and As needed   Please contact office for sooner follow up if symptoms do not improve or worsen or seek emergency care

## 2017-03-14 NOTE — Assessment & Plan Note (Signed)
Spirometry is stable . FENO low . Asthma appears stable without flare  Check cxr and labs to make sure not contributing to sx.  Suspect this is anxiety /panic that is main issue.   Plan  Patient Instructions  Chest xray and labs today .  Continue on Dulera 2 puffs Twice daily  , rinse after use.  Refer to Internal medicine to establish .  Continue to go to therapist .  Follow up with Dr. Sherene Sires  In 3 months and As needed   Please contact office for sooner follow up if symptoms do not improve or worsen or seek emergency care

## 2017-03-14 NOTE — Addendum Note (Signed)
Addended by: Boone Master E on: 03/14/2017 05:16 PM   Modules accepted: Orders

## 2017-03-14 NOTE — Assessment & Plan Note (Signed)
Anxiety /stress - refer to internal med  Labs pending

## 2017-04-17 ENCOUNTER — Encounter: Payer: Self-pay | Admitting: Internal Medicine

## 2017-04-17 ENCOUNTER — Ambulatory Visit (INDEPENDENT_AMBULATORY_CARE_PROVIDER_SITE_OTHER): Payer: Self-pay | Admitting: Internal Medicine

## 2017-04-17 VITALS — BP 132/92 | HR 72 | Ht 68.0 in | Wt 198.4 lb

## 2017-04-17 DIAGNOSIS — J4541 Moderate persistent asthma with (acute) exacerbation: Secondary | ICD-10-CM

## 2017-04-17 MED ORDER — PREDNISONE 10 MG PO TABS: ORAL_TABLET | ORAL | 0 refills | Status: DC

## 2017-04-17 MED ORDER — DOXYCYCLINE HYCLATE 100 MG PO TABS: 100.0000 mg | ORAL_TABLET | Freq: Two times a day (BID) | ORAL | 0 refills | Status: DC

## 2017-04-17 MED ORDER — TRAMADOL HCL 50 MG PO TABS: ORAL_TABLET | ORAL | 0 refills | Status: DC

## 2017-04-17 NOTE — Progress Notes (Signed)
@Patient  ID: Crystal Sampson, female    DOB: [birth year] .   MRN: [id number]      HPI: 68 yobf   followed for asthma and AR , high IgE  Studies :    Crystal Sampson 2001: FEV1 2.38 (68%), ratio 80.  AP 03/20/2004-total IgE 1977.5 with broad elevation of almost all allergens and extremely high level against dog dander Crystal Sampson 04/2012:  FEV1 1.72 (58%), ratio 68 No response to singulair.  - changed advair to [facility] 12/15/2014 > changed to Dulera 100 due to insurance restrictions 07/08/2015  Allergy Profile 07/17/2012-total IgE 1144.9 with elevation of all potential allergens on the panel Allergy Skin Test- 03/25/13- significant positive for grass weed and tree pollens dust mite, cat   07/17/2016 follow-up asthma. NP  Patient returns for a three-month follow-up. Patient says she is doing well with her asthma. She denies any flare of wheezing or cough. Patient does get Dulera through patient assistance program. She says that she will be changing her insurance soon and would like her patient assistance papers filled back out. She does complain of some nasal drip and stuffiness intermittently. She's been using some over-the-counter nasal sprays. Denies any fever or discolored mucus, chest pain, orthopnea, PND, or increased leg swelling. She denies any increased use of albuterol. rec Continue on Dulera 2 puffs Twice daily  , rinse after use.  Saline nasal rinses As needed   Begin Flonase 1 puff Twice daily     02/14/2017  f/u ov/Crystal Sampson re: asthma/ atopic  Chief Complaint  Patient presents with  . Follow-up    Breathing is overall doing well. She rarely uses albuterol inhaler or neb.    despite very poor and inconsistent hfa technique no daytime or noct symptoms or need for hfa rec Plan A = Automatic =  Dulera 100 Take 2 puffs first thing in am and then another 2 puffs about 12 hours later.  Work on inhaler technique:   Plan B = Backup Only use your albuterol (ventolin)  Plan C = Crisis - only use your  albuterol nebulizer if you first try Plan B and it fails to help > ok to use the nebulizer up to every 4 hours but if start needing it regularly call for immediate appointment   04/17/2017 acute extended ov/Crystal Sampson re: atopic asthma with cough flare Chief Complaint  Patient presents with  . Acute Visit    Increased cough and congestion x 2 wks. She states cough is waking her up in the night and she is coughing so hard she is vomiting. She is coughing up green sputum.  She is using her albuterol inhaler 3-4 x per wk, and has not needed neb.   fine 2 weeks prior to OV  On just on dulera 100 Take 2 puffs first thing in am and then another 2 puffs about 12 hours later.  Getting dulera directly from [employer] contact 2 week prior to OV  > sev days later developed  sore throat dry cough / rx mucinex  Worse at hs than daytime s nasal congestion / Mucus light yellow turning to green  No rescue in hand but never takes more than one a day and could not tell me what the written action plan has for saba use     No obvious day to day or daytime variability or assoc mucus plugs or hemoptysis or cp or chest tightness, subjective wheeze or overt sinus or hb symptoms. No unusual exp hx or h/o childhood  pna/ asthma or knowledge of premature birth.     Also denies any obvious fluctuation of symptoms with weather or environmental changes or other aggravating or alleviating factors except as outlined above   Current Allergies, Complete Past Medical History, Past Surgical History, Family History, and Social History were reviewed in [facility] electronic medical record.  ROS  The following are not active complaints unless bolded Hoarseness, sore throat, dysphagia, dental problems, itching, sneezing,  nasal congestion or discharge of excess mucus or purulent secretions, ear ache,   fever, chills, sweats, unintended wt loss or wt gain, classically pleuritic or exertional cp,  orthopnea pnd or leg swelling,  presyncope, palpitations, abdominal pain, anorexia, nausea, vomiting, diarrhea  or change in bowel habits or change in bladder habits, change in stools or change in urine, dysuria, hematuria,  rash, arthralgias, visual complaints, headache, numbness, weakness or ataxia or problems with walking or coordination,  change in mood/affect or memory.        No outpatient prescriptions have been marked as taking for the 04/17/17 encounter (Office Visit) with Crystal Sampson, Crystal Sampson B, MD. As  Unsure of meds                       Physical Exam  amb bf nad   04/17/2017    198   02/14/17 204 lb 12.8 oz (92.9 kg)  07/17/16 208 lb 3.2 oz (94.4 kg)  03/27/16 178 lb (80.7 kg)    Vital signs reviewed   - Note on arrival 02 sats  98% on RA     HEENT: nl dentition, turbinates bilaterally, and oropharynx. Nl external ear canals without cough reflex   NECK :  without JVD/Nodes/TM/ nl carotid upstrokes bilaterally   LUNGS: no acc muscle use,  Nl contour chest which is clear to A and P bilaterally with mid bilateral exp rhonchi/harsh cough on fvc maneuver    CV:  RRR  no s3 or murmur or increase in P2, and no edema   ABD:  soft and nontender with nl inspiratory excursion in the supine position. No bruits or organomegaly appreciated, bowel sounds nl  MS:  Nl gait/ ext warm without deformities, calf tenderness, cyanosis or clubbing No obvious joint restrictions   SKIN: warm and dry without lesions    NEURO:  alert, approp, nl sensorium with  no motor or cerebellar deficits apparent.       I personally reviewed images and agree with radiology impression as follows:  CXR:   03/14/17 No active cardiopulmonary disease.     Assessment & Plan:

## 2017-04-17 NOTE — Patient Instructions (Addendum)
For cough > mucinex dm 1200 mg every 12 hours and supplement with tramadol 50 mg every 4 hours as needed  Work on inhaler technique:  relax and gently blow all the way out then take a nice smooth deep breath back in, triggering the inhaler at same time you start breathing in.  Hold for up to 5 seconds if you can. Blow out thru nose. Rinse and gargle with water when done     Prednisone 10 mg take  4 each am x 2 days,   2 each am x 2 days,  1 each am x 2 days and stop   Doxycycline  100 mg twice twice daily x 10 days    If the cough continues >   You will need to add Try prilosec otc 20mg   Take 30-60 min before first meal of the day and Pepcid ac (famotidine) 20 mg one @  bedtime until cough is completely gone for at least a week without the need for cough suppression     GERD (REFLUX)  is an extremely common cause of respiratory symptoms just like yours , many times with no obvious heartburn at all.    It can be treated with medication, but also with lifestyle changes including elevation of the head of your bed (ideally with 6 inch  bed blocks),  Smoking cessation, avoidance of late meals, excessive alcohol, and avoid fatty foods, chocolate, peppermint, colas, red wine, and acidic juices such as orange juice.  NO MINT OR MENTHOL PRODUCTS SO NO COUGH DROPS   USE SUGARLESS CANDY INSTEAD (Jolley ranchers or Stover's or Life Savers) or even ice chips will also do - the key is to swallow to prevent all throat clearing. NO OIL BASED VITAMINS - use powdered substitutes.   Keep your follow up appt to see me in November but call sooner if needed.

## 2017-04-19 NOTE — Assessment & Plan Note (Addendum)
AP 03/20/2004  IgE 1877, RAST very abnormal 2007. 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 Acute exacerbation/hospitalization 03/2012 Cleda DaubSpiro 04/2012:  FEV1 1.72 (58%), ratio 68 No response to singulair.  - [facility] > [facility] 07/08/2015   - 04/17/2017  extensive coaching HFA effectiveness =    75%  From a baseline of 50%   Despite poor baseline hfa use she was doing well until acute flare likely related to uri with sick contact now with acute bronchitis > rec  Prednisone 10 mg take  4 each am x 2 days,   2 each am x 2 days,  1 each am x 2 days and stop and doxy 100 bid x 10 days and add gerd rx if not improving rapidly back to 100% baseline based on following concerns:  Of the three most common causes of  Sub-acute or recurrent or chronic cough, only one (GERD)  can actually contribute to/ trigger  the other two (asthma and post nasal drip syndrome)  and perpetuate the cylce of cough.  While not intuitively obvious, many patients with chronic low grade reflux do not cough until there is a primary insult that disturbs the protective epithelial barrier and exposes sensitive nerve endings.   This is typically viral but can be direct physical injury such as with an endotracheal tube.   The point is that once this occurs, it is difficult to eliminate the cycle  using anything but a maximally effective acid suppression regimen at least in the short run, accompanied by an appropriate diet to address non acid GERD and control / eliminate the cough itself for at least 3 days > try rx with mucinex dm/ tramadol then add gerd rx next plus start diet now and continue indefinitely    I had an extended discussion with the patient reviewing all relevant studies completed to date and  lasting 15 to 20 minutes of a 25 minute acute visit    Each maintenance medication was reviewed in detail including most importantly the difference  between maintenance and prns and under what circumstances the prns are to be triggered using an action plan format that is not reflected in the computer generated alphabetically organized AVS.    Please see AVS for specific instructions unique to this visit that I personally wrote and verbalized to the the pt in detail and then reviewed with pt  by my nurse highlighting any  changes in therapy recommended at today's visit to their plan of care.

## 2017-04-23 ENCOUNTER — Encounter (HOSPITAL_COMMUNITY): Payer: Self-pay | Admitting: Family Medicine

## 2017-04-23 ENCOUNTER — Ambulatory Visit (HOSPITAL_COMMUNITY)
Admission: EM | Admit: 2017-04-23 | Discharge: 2017-04-23 | Disposition: A | Discharge status: Home / Self Care | Payer: Medicaid Other | Attending: Family Medicine | Admitting: Family Medicine

## 2017-04-23 DIAGNOSIS — R05 Cough: Secondary | ICD-10-CM

## 2017-04-23 DIAGNOSIS — R059 Cough, unspecified: Secondary | ICD-10-CM

## 2017-04-23 MED ORDER — OMEPRAZOLE 10 MG PO CPDR: 10.0000 mg | DELAYED_RELEASE_CAPSULE | Freq: Every day | ORAL | 1 refills | Status: DC

## 2017-04-23 MED ORDER — FAMOTIDINE 20 MG PO TABS: 20.0000 mg | ORAL_TABLET | Freq: Two times a day (BID) | ORAL | 1 refills | Status: DC

## 2017-04-23 NOTE — ED Triage Notes (Signed)
Pt here for cough follow up from last week. Reports she took prednisone but still not better.

## 2017-04-23 NOTE — Discharge Instructions (Signed)
Try over the counter prilosec (omeprazole) 20mg , take 30-60 min before first meal of the day and pepcid (famotidine) 20 mg one @  bedtime until cough is completely gone for at least a week without the need for cough suppression. Since your sputum is turning clear, I do not think you need to get the antibiotic. [facility] as you have been and your rescue inhaler if you need it. You should restart the mucinex 1200 mg every 12 hours as well.

## 2017-04-23 NOTE — ED Provider Notes (Signed)
[facility]    CSN: [id number] Arrival date & time: 04/23/17  1020     History   Chief Complaint Chief Complaint  Patient presents with  . Cough    HPI Crystal Sampson is a 41 y.o. female.   Patient is a 21yo F with asthma who presents with cough for the last 4 weeks. She states started after friend with a cold visited her and she subsequently developed a cough. She saw her pulmonologist Dr Sherene Sires for this when the cough persisted who instructed her on the correct way to use her inhalers and treated her with prednisone and doxycycline. Patient was able to pick up and take the prednisone but was not able to afford the doxycycline. She states her cough is unchanged in frequency but perhaps she is having less posttussive emesis but sputum production is now turning clear. She is compliant on her home dulera but has not needed her rescue inhaler or nebulizer treatments at all. Only has SOB or wheezing after a coughing episode. No fever/chills. Has not tried any acid reducer medications. Stopped taking mucinex because she felt like it wasn't helping.         Past Medical History:  Diagnosis Date  . Asthma   . Depression    goes to [facility], quit taking meds  . 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 Anxiety 03/14/2017  . Other chest pain 12/15/2014  . Obesity 12/15/2014  . Seasonal and perennial allergic rhinitis 10/19/2012  . Acute sinusitis 08/26/2012  . 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 Moderate persistent asthma in adult without complication 05/07/2012  . Anxiety and depression 03/24/2012  . Tachycardia 03/24/2012  . Leukocytosis 03/24/2012  . Noncompliance 03/24/2012  . Asthma exacerbation 03/23/2012    Past Surgical History:  Procedure Laterality Date  . NO PAST SURGERIES      OB History    Gravida Para Term Preterm AB Living   3 2 2   1 2    SAB TAB Ectopic Multiple  Live Births     1     2       Home Medications    Prior to Admission medications   Medication Sig Start Date End Date Taking? Authorizing Provider  albuterol (PROAIR HFA) 108 (90 Base) MCG/ACT inhaler 2 puffs every 4 hours as needed only  if your can't catch your breath 07/08/15   Nyoka Cowden, MD  albuterol (PROVENTIL) (2.5 MG/3ML) 0.083% nebulizer solution Take 6 mLs (5 mg total) by nebulization every 6 (six) hours as needed for wheezing or shortness of breath. DX: J45.40 03/11/17   Lupita Leash, MD  doxycycline (VIBRA-TABS) 100 MG tablet Take 1 tablet (100 mg total) by mouth 2 (two) times daily. 04/17/17   Nyoka Cowden, MD  mometasone-formoterol (DULERA) 100-5 MCG/ACT AERO Take 2 puffs first thing in am and then another 2 puffs about 12 hours later. 02/14/17   Nyoka Cowden, MD  predniSONE (DELTASONE) 10 MG tablet Take  4 each am x 2 days,   2 each am x 2 days,  1 each am x 2 days and stop 04/17/17   Nyoka Cowden, MD  traMADol Janean Sark) 50 MG tablet 1-2 every 4 hours as needed for cough or pain 04/17/17   Nyoka Cowden, MD    Family History Family History  Problem Relation Age of Onset  .  Glaucoma Mother   . Hypertension Father   . Other Neg Hx     Social History Social History  Substance Use Topics  . Smoking status: Never Smoker  . Smokeless tobacco: Never Used  . Alcohol use No     Allergies   Penicillins   Review of Systems Review of Systems  Constitutional: Negative for chills and fever.  HENT: Negative for congestion, postnasal drip, rhinorrhea, sinus pain, sinus pressure and sore throat.   Respiratory: Positive for cough. Negative for shortness of breath.   Cardiovascular: Negative for chest pain and palpitations.  Gastrointestinal: Negative for abdominal pain, diarrhea, nausea and vomiting.  Genitourinary: Negative for dysuria.  Neurological: Negative for light-headedness and headaches.     Physical Exam Triage Vital Signs ED Triage  Vitals [04/23/17 1121]  Enc Vitals Group     BP 118/75     Pulse Rate 83     Resp 18     Temp 98.6 F (37 C)     Temp src      SpO2 100 %     Weight      Height      Head Circumference      Peak Flow      Pain Score      Pain Loc      Pain Edu?      Excl. in GC?    No data found.   Updated Vital Signs BP 118/75   Pulse 83   Temp 98.6 F (37 C)   Resp 18   SpO2 100%   Visual Acuity Right Eye Distance:   Left Eye Distance:   Bilateral Distance:    Right Eye Near:   Left Eye Near:    Bilateral Near:     Physical Exam  Constitutional: She is oriented to person, place, and time. She appears well-developed and well-nourished. No distress.  HENT:  Head: Normocephalic and atraumatic.  Right Ear: External ear normal.  Left Ear: External ear normal.  Nose: Nose normal.  Mouth/Throat: Oropharynx is clear and moist. No oropharyngeal exudate.  Eyes: Conjunctivae and EOM are normal. Right eye exhibits no discharge. Left eye exhibits no discharge.  Neck: Normal range of motion. Neck supple.  Cardiovascular: Normal rate, regular rhythm and normal heart sounds.   No murmur heard. Pulmonary/Chest: Effort normal and breath sounds normal. No respiratory distress. She has no wheezes.  Abdominal: Soft. Bowel sounds are normal. She exhibits no distension. There is no tenderness. There is no rebound and no guarding.  Musculoskeletal: Normal range of motion.  Lymphadenopathy:    She has no cervical adenopathy.  Neurological: She is alert and oriented to person, place, and time. She exhibits normal muscle tone.  Skin: Skin is warm and dry. Capillary refill takes less than 2 seconds. She is not diaphoretic.  Psychiatric: She has a normal mood and affect.     UC Treatments / Results  Labs (all labs ordered are listed, but only abnormal results are displayed) Labs Reviewed - No data to display  EKG  EKG Interpretation None       Radiology No results  found.  Procedures Procedures (including critical care time)  Medications Ordered in UC Medications - No data to display   Initial Impression / Assessment and Plan / UC Course  I have reviewed the triage vital signs and the nursing notes.  Pertinent labs & imaging results that were available during my care of the patient were reviewed by me and considered  in my medical decision making (see chart for details).   Patient is a 41 yo F with h/o asthma who presented with cough for the past 4 weeks that has been consistent but not worsening. She took prednisone as direct by her pulmonologist without significant relief but had not trialed GERD medications. Her sputum purulence is now self resolving and she is well appearing on exam without wheezing and afebrile so advised that she likely does not need doxycycline. Recommended for patient to trial omeprazole and famotidine as instructed by her pulmonologist.   Final Clinical Impressions(s) / UC Diagnoses   Final diagnoses:  Cough    New Prescriptions Discharge Medication List as of 04/23/2017 11:50 AM    START taking these medications   Details  famotidine (PEPCID) 20 MG tablet Take 1 tablet (20 mg total) by mouth 2 (two) times daily., Starting Tue 04/23/2017, Until Wed 04/23/2018, Normal    omeprazole (PRILOSEC) 10 MG capsule Take 1 capsule (10 mg total) by mouth daily., Starting Tue 04/23/2017, Until Wed 04/23/2018, Normal         Jeneen RinksYoo, Anushree Dorsi J, DO 04/23/17 1223

## 2017-05-20 ENCOUNTER — Ambulatory Visit: Payer: Self-pay | Admitting: Internal Medicine

## 2017-12-03 ENCOUNTER — Inpatient Hospital Stay (HOSPITAL_COMMUNITY)
Admission: AD | Admit: 2017-12-03 | Discharge: 2017-12-03 | Disposition: A | Discharge status: Home / Self Care | Payer: Medicaid Other | Source: Ambulatory Visit | Attending: Obstetrics & Gynecology | Admitting: Obstetrics & Gynecology

## 2017-12-03 DIAGNOSIS — N912 Amenorrhea, unspecified: Secondary | ICD-10-CM

## 2017-12-03 NOTE — MAU Provider Note (Signed)
Ms. Crystal PeanBelinda G Sampson is a 42 y.o. [id number] who present to MAU today for pregnancy confirmation. She denies abdominal pain or vaginal bleeding. She took 2 HPT test that were both positive.   BP (!) 133/93 (BP Location: Right Arm)   Pulse 92   Temp 99.1 F (37.3 C)   Resp 18   Ht 5\' 8"  (1.727 m)   Wt 169 lb (76.7 kg)   LMP 11/02/2017   BMI 25.70 kg/m  CONSTITUTIONAL: Well-developed, well-nourished female in no acute distress.  CARDIOVASCULAR: Normal heart rate noted RESPIRATORY: Effort and breath sounds normal GASTROINTESTINAL:Soft, no distention noted.  No tenderness, rebound or guarding.  SKIN: Skin is warm and dry. No rash noted. Not diaphoretic. No erythema. No pallor. PSYCHIATRIC: Normal mood and affect. Normal behavior. Normal judgment and thought content.  MDM Medical screening exam complete Patient does not endorse any symptoms concerning for ectopic pregnancy or pregnancy related complication today.   A:  Amenorrhea  P: Discharge home Patient advised that she can present as a walk-in to [facility] for a pregnancy test M-Th between 8am-4pm or Friday between 8am -11am Reasons to return to MAU reviewed  Patient may return to MAU as needed or if her condition were to change or worsen Safe medication in pregnancy list given to patient and OBGYN providers in [place] list given   Sharyon CableRogers, Dakwan Pridgen C, CNM 12/03/2017 11:10 PM

## 2017-12-03 NOTE — MAU Note (Addendum)
Pt states her period has been late x7 days and she had 2 +upt at home. Pt wants confirmation. No complaints. LMP: 11/02/2017

## 2017-12-04 ENCOUNTER — Encounter: Payer: Self-pay | Admitting: Obstetrics and Gynecology

## 2017-12-04 ENCOUNTER — Ambulatory Visit (INDEPENDENT_AMBULATORY_CARE_PROVIDER_SITE_OTHER): Payer: Self-pay

## 2017-12-04 ENCOUNTER — Other Ambulatory Visit: Payer: Self-pay

## 2017-12-04 VITALS — BP 129/67 | HR 90 | Wt 170.0 lb

## 2017-12-04 DIAGNOSIS — Z3201 Encounter for pregnancy test, result positive: Secondary | ICD-10-CM

## 2017-12-04 DIAGNOSIS — N912 Amenorrhea, unspecified: Secondary | ICD-10-CM

## 2017-12-04 LAB — POCT PREGNANCY, URINE: Preg Test, Ur: POSITIVE — AB

## 2017-12-04 NOTE — Patient Instructions (Signed)
Common Medications Safe in Pregnancy  Acne:      Constipation:  Benzoyl Peroxide     Colace  Clindamycin      Dulcolax Suppository  Topica Erythromycin     Fibercon  Salicylic Acid      Metamucil         Miralax AVOID:        Senakot   Accutane    Cough:  Retin-A       Cough Drops  Tetracycline      Phenergan w/ Codeine if Rx  Minocycline      Robitussin (Plain & DM)  Antibiotics:     Crabs/Lice:  Ceclor       RID  Cephalosporins    AVOID:  E-Mycins      Kwell  Keflex  Macrobid/Macrodantin   Diarrhea:  Penicillin      Kao-Pectate  Zithromax      Imodium AD         PUSH FLUIDS AVOID:       Cipro     Fever:  Tetracycline      Tylenol (Regular or Extra  Minocycline       Strength)  Levaquin      Extra Strength-Do not          Exceed 8 tabs/24 hrs Caffeine:        <200mg/day (equiv. To 1 cup of coffee or  approx. 3 12 oz sodas)         Gas: Cold/Hayfever:       Gas-X  Benadryl      Mylicon  Claritin       Phazyme  **Claritin-D        Chlor-Trimeton    Headaches:  Dimetapp      ASA-Free Excedrin  Drixoral-Non-Drowsy     Cold Compress  Mucinex (Guaifenasin)     Tylenol (Regular or Extra  Sudafed/Sudafed-12 Hour     Strength)  **Sudafed PE Pseudoephedrine   Tylenol Cold & Sinus     Vicks Vapor Rub  Zyrtec  **AVOID if Problems With Blood Pressure         Heartburn: Avoid lying down for at least 1 hour after meals  Aciphex      Maalox     Rash:  Milk of Magnesia     Benadryl    Mylanta       1% Hydrocortisone Cream  Pepcid  Pepcid Complete   Sleep Aids:  Prevacid      Ambien   Prilosec       Benadryl  Rolaids       Chamomile Tea  Tums (Limit 4/day)     Unisom  Zantac       Tylenol PM         Warm milk-add vanilla or  Hemorrhoids:       Sugar for taste  Anusol/Anusol H.C.  (RX: Analapram 2.5%)  Sugar Substitutes:  Hydrocortisone OTC     Ok in moderation  Preparation H      Tucks        Vaseline lotion applied to tissue with  wiping    Herpes:     Throat:  Acyclovir      Oragel  Famvir  Valtrex     Vaccines:         Flu Shot Leg Cramps:       *Gardasil  Benadryl      Hepatitis A         Hepatitis B Nasal Spray:         Pneumovax  Saline Nasal Spray     Polio Booster         Tetanus Nausea:       Tuberculosis test or PPD  Vitamin B6 25 mg TID   AVOID:    Dramamine      *Gardasil  Emetrol       Live Poliovirus  Ginger Root 250 mg QID    MMR (measles, mumps &  High Complex Carbs @ Bedtime    rebella)  Sea Bands-Accupressure    Varicella (Chickenpox)  Unisom 1/2 tab TID     *No known complications           If received before Pain:         Known pregnancy;   Darvocet       Resume series after  Lortab        Delivery  Percocet    Yeast:   Tramadol      Femstat  Tylenol 3      Gyne-lotrimin  Ultram       Monistat  Vicodin           MISC:         All Sunscreens           Hair Coloring/highlights          Insect Repellant's          (Including DEET)         Mystic Tans  BENEFITS OF BREASTFEEDING Many women wonder if they should breastfeed. Research shows that breast milk contains the perfect balance of vitamins, protein and fat that your baby needs to grow. It also contains antibodies that help your baby's immune system to fight off viruses and bacteria and can reduce the risk of sudden infant death syndrome (SIDS). In addition, the colostrum (a fluid secreted from the breast in the first few days after delivery) helps your newborn's digestive system to grow and function well. Breast milk is easier to digest than formula. Also, if your baby is born preterm, breast milk can help to reduce both short- and long-term health problems. BENEFITS OF BREASTFEEDING FOR MOM . Breastfeeding causes a hormone to be released that helps the uterus to contract and return to its normal size more quickly. . It aids in postpartum weight loss, reduces risk of breast and ovarian cancer, heart disease and rheumatoid  arthritis. . It decreases the amount of bleeding after the baby is born. benefits of breastfeeding for baby . Provides comfort and nutrition . Protects baby against - Obesity - Diabetes - Asthma - Childhood cancers - Heart disease - Ear infections - Diarrhea - Pneumonia - Stomach problems - Serious allergies - Skin rashes . Promotes growth and development . Reduces the risk of baby having Sudden Infant Death Syndrome (SIDS) only breastmilk for the first 6 months . Protects baby against diseases/allergies . It's the perfect amount for tiny bellies . It restores baby's energy . Provides the best nutrition for baby . Giving water or formula can make baby more likely to get sick, decrease Mom's milk supply, make baby less content with breastfeeding Skin to Skin After delivery, the staff will place your baby on your chest. This helps with the following: . Regulates baby's temperature, breathing, heart rate and blood sugar . Increases Mom's milk supply . Promotes bonding . Keeps baby and Mom calm and decreases baby's crying Rooming In Your baby will stay in your room with you for the entire time you are in the  hospital. This helps with the following: . Allows Mom to learn baby's feeding cues - Fluttering eyes - Sucking on tongue or hand - Rooting (opens mouth and turns head) - Nuzzling into the breast - Bringing hand to mouth . Allows breastfeeding on demand (when your baby is ready) . Helps baby to be calm and content . Ensures a good milk supply . Prevents complications with breastfeeding . Allows parents to learn to care for baby . Allows you to request assistance with breastfeeding Importance of a good latch . Increases milk transfer to baby - baby gets enough milk . Ensures you have enough milk for your baby . Decreases nipple soreness . Don't use pacifiers and bottles - these cause baby to suck differently than breastfeeding . Promotes continuation of  breastfeeding Risks of Formula Supplementation with Breastfeeding Giving your infant formula in addition to your breast-milk EXCEPT when medically necessary can lead to: Marland Kitchen Decreases your milk supply  . Loss of confidence in yourself for providing baby's nutrition  . Engorgement and possibly mastitis  . Asthma & allergies in the baby BREASTFEEDING FAQS How long should I breastfeed my baby? It is recommended that you provide your baby with breast milk only for the first 6 months and then continue for the first year and longer as desired. During the first few weeks after birth, your baby will need to feed 8-12 times every 24 hours, or every 2-3 hours. They will likely feed for 15-30 minutes. How can I help my baby begin breastfeeding? Babies are born with an instinct to breastfeed. A healthy baby can begin breastfeeding right away without specific help. At the hospital, a nurse (or lactation consultant) will help you begin the process and will give you tips on good positioning. It may be helpful to take a breastfeeding class before you deliver in order to know what to expect. How can I help my baby latch on? In order to assist your baby in latching-on, cup your breast in your hand and stroke your baby's lower lip with your nipple to stimulate your baby's rooting reflex. Your baby will look like he or she is yawning, at which point you should bring the baby towards your breast, while aiming the nipple at the roof of his or her mouth. Remember to bring the baby towards you and not your breast towards the baby. How can I tell if my baby is latched-on? Your baby will have all of your nipple and part of the dark area around the nipple in his or her mouth and your baby's nose will be touching your breast. You should see or hear the baby swallowing. If the baby is not latched-on properly, start the process over. To remove the suction, insert a clean finger between your breast and the baby's mouth. Should I  switch breasts during feeding? After feeding on one side, switch the baby to your other breast. If he or she does not continue feeding - that is OK. Your baby will not necessarily need to feed from both breasts in a single feeding. On the next feeding, start with the other breast for efficiency and comfort. How can I tell if my baby is hungry? When your baby is hungry, they will nuzzle against your breast, make sucking noises and tongue motions and may put their hands near their mouth. Crying is a late sign of hunger, so you should not wait until this point. When they have received enough milk, they will unlatch from the breast.  Is it okay to use a pacifier? Until your baby gets the hang of breastfeeding, experts recommend limiting pacifier usage. If you have questions about this, please contact your pediatrician. What can I do to ensure proper nutrition while breastfeeding? . Make sure that you support your own health and your baby's by eating a healthy, well-balanced diet . Your provider may recommend that you continue to take your prenatal vitamin . Drink plenty of fluids. It is a good rule to drink one glass of water before or after feeding . Alcohol will remain in the breast milk for as long as it will remain in the blood stream. If you choose to have a drink, it is recommended that you wait at least 2 hours before feeding . Moderate amounts of caffeine are OK . Some over-the-counter or prescription medications are not recommended during breastfeeding. Check with your provider if you have questions What types of birth control methods are safe while breastfeeding? Progestin-only methods, including a daily pill, an IUD, the implant and the injection are safe while breastfeeding. Methods that contain estrogen (such as combination birth control pills, the vaginal ring and the patch) should not be used during the first month of breastfeeding as these can decrease your milk supply.

## 2017-12-04 NOTE — Progress Notes (Signed)
Patient here today for pregnancy test.  Test is positive.  LMP: 11/02/2017  EDD:08/12/2018.

## 2017-12-08 ENCOUNTER — Inpatient Hospital Stay (HOSPITAL_COMMUNITY): Payer: Medicaid Other

## 2017-12-08 ENCOUNTER — Inpatient Hospital Stay (HOSPITAL_COMMUNITY)
Admission: AD | Admit: 2017-12-08 | Discharge: 2017-12-09 | Disposition: A | Discharge status: Home / Self Care | Payer: Medicaid Other | Source: Ambulatory Visit | Attending: Obstetrics and Gynecology | Admitting: Obstetrics and Gynecology

## 2017-12-08 ENCOUNTER — Other Ambulatory Visit: Payer: Self-pay

## 2017-12-08 ENCOUNTER — Encounter (HOSPITAL_COMMUNITY): Payer: Self-pay | Admitting: *Deleted

## 2017-12-08 DIAGNOSIS — O99511 Diseases of the respiratory system complicating pregnancy, first trimester: Secondary | ICD-10-CM | POA: Insufficient documentation

## 2017-12-08 DIAGNOSIS — Y999 Unspecified external cause status: Secondary | ICD-10-CM | POA: Insufficient documentation

## 2017-12-08 DIAGNOSIS — Y92009 Unspecified place in unspecified non-institutional (private) residence as the place of occurrence of the external cause: Secondary | ICD-10-CM

## 2017-12-08 DIAGNOSIS — G9389 Other specified disorders of brain: Secondary | ICD-10-CM | POA: Insufficient documentation

## 2017-12-08 DIAGNOSIS — O9A211 Injury, poisoning and certain other consequences of external causes complicating pregnancy, first trimester: Secondary | ICD-10-CM

## 2017-12-08 DIAGNOSIS — O99341 Other mental disorders complicating pregnancy, first trimester: Secondary | ICD-10-CM | POA: Diagnosis not present

## 2017-12-08 DIAGNOSIS — R109 Unspecified abdominal pain: Secondary | ICD-10-CM | POA: Insufficient documentation

## 2017-12-08 DIAGNOSIS — F329 Major depressive disorder, single episode, unspecified: Secondary | ICD-10-CM | POA: Diagnosis not present

## 2017-12-08 DIAGNOSIS — Z79899 Other long term (current) drug therapy: Secondary | ICD-10-CM | POA: Insufficient documentation

## 2017-12-08 DIAGNOSIS — O26899 Other specified pregnancy related conditions, unspecified trimester: Secondary | ICD-10-CM

## 2017-12-08 DIAGNOSIS — T1490XA Injury, unspecified, initial encounter: Secondary | ICD-10-CM | POA: Insufficient documentation

## 2017-12-08 DIAGNOSIS — O99351 Diseases of the nervous system complicating pregnancy, first trimester: Secondary | ICD-10-CM | POA: Insufficient documentation

## 2017-12-08 DIAGNOSIS — Y92019 Unspecified place in single-family (private) house as the place of occurrence of the external cause: Secondary | ICD-10-CM | POA: Diagnosis not present

## 2017-12-08 DIAGNOSIS — S0003XA Contusion of scalp, initial encounter: Secondary | ICD-10-CM | POA: Diagnosis not present

## 2017-12-08 DIAGNOSIS — Z3A01 Less than 8 weeks gestation of pregnancy: Secondary | ICD-10-CM | POA: Insufficient documentation

## 2017-12-08 DIAGNOSIS — S098XXA Other specified injuries of head, initial encounter: Secondary | ICD-10-CM

## 2017-12-08 DIAGNOSIS — O3680X0 Pregnancy with inconclusive fetal viability, not applicable or unspecified: Secondary | ICD-10-CM

## 2017-12-08 DIAGNOSIS — O26891 Other specified pregnancy related conditions, first trimester: Secondary | ICD-10-CM | POA: Diagnosis present

## 2017-12-08 DIAGNOSIS — J45909 Unspecified asthma, uncomplicated: Secondary | ICD-10-CM | POA: Insufficient documentation

## 2017-12-08 LAB — URINALYSIS, ROUTINE W REFLEX MICROSCOPIC
Bilirubin Urine: NEGATIVE
Glucose, UA: NEGATIVE mg/dL
Hgb urine dipstick: NEGATIVE
Ketones, ur: NEGATIVE mg/dL
Leukocytes, UA: NEGATIVE
Nitrite: NEGATIVE
Protein, ur: NEGATIVE mg/dL
Specific Gravity, Urine: 1.008 (ref 1.005–1.030)
pH: 6 (ref 5.0–8.0)

## 2017-12-08 LAB — RAPID URINE DRUG SCREEN, HOSP PERFORMED
Amphetamines: NOT DETECTED
Benzodiazepines: NOT DETECTED
COCAINE: NOT DETECTED
Opiates: NOT DETECTED
Tetrahydrocannabinol: NOT DETECTED

## 2017-12-08 LAB — CBC
HCT: 35.7 % — ABNORMAL LOW (ref 36.0–46.0)
HEMOGLOBIN: 12.6 g/dL (ref 12.0–15.0)
MCH: 32.3 pg (ref 26.0–34.0)
MCHC: 35.3 g/dL (ref 30.0–36.0)
MCV: 91.5 fL (ref 78.0–100.0)
Platelets: 357 10*3/uL (ref 150–400)
RBC: 3.9 MIL/uL (ref 3.87–5.11)
RDW: 12.8 % (ref 11.5–15.5)
WBC: 12.8 10*3/uL — ABNORMAL HIGH (ref 4.0–10.5)

## 2017-12-08 LAB — WET PREP, GENITAL
Clue Cells Wet Prep HPF POC: NONE SEEN
Sperm: NONE SEEN
Trich, Wet Prep: NONE SEEN
Yeast Wet Prep HPF POC: NONE SEEN

## 2017-12-08 LAB — ABO/RH: ABO/RH(D): O POS

## 2017-12-08 LAB — HCG, QUANTITATIVE, PREGNANCY: hCG, Beta Chain, Quant, S: 3695 m[IU]/mL — ABNORMAL HIGH
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
## 2017-12-08 NOTE — MAU Note (Signed)
Applied an ice pack to the left side of pt forehead over  the 4cm X 5cm knot  Pt also has scratches on both forearms and abrasions on both knees and a cut on her left great toe.
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
## 2017-12-09 DIAGNOSIS — O9A211 Injury, poisoning and certain other consequences of external causes complicating pregnancy, first trimester: Secondary | ICD-10-CM

## 2017-12-09 LAB — HIV ANTIBODY (ROUTINE TESTING W REFLEX): HIV SCREEN 4TH GENERATION: NONREACTIVE

## 2017-12-09 LAB — GC/CHLAMYDIA PROBE AMP (~~LOC~~) NOT AT ARMC
Chlamydia: NEGATIVE
Neisseria Gonorrhea: NEGATIVE

## 2017-12-09 MED ORDER — ACETAMINOPHEN 500 MG PO TABS
1000.0000 mg | ORAL_TABLET | Freq: Once | ORAL | Status: AC
  Administered 2017-12-09: 1000 mg via ORAL
  Filled 2017-12-09: qty 2

## 2017-12-09 MED ORDER — TRAMADOL HCL 50 MG PO TABS: 50.0000 mg | ORAL_TABLET | Freq: Four times a day (QID) | ORAL | 0 refills | Status: DC | PRN
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
## 2017-12-11 ENCOUNTER — Ambulatory Visit (INDEPENDENT_AMBULATORY_CARE_PROVIDER_SITE_OTHER): Payer: Self-pay

## 2017-12-11 DIAGNOSIS — O3680X0 Pregnancy with inconclusive fetal viability, not applicable or unspecified: Secondary | ICD-10-CM

## 2017-12-11 LAB — HCG, QUANTITATIVE, PREGNANCY: HCG, BETA CHAIN, QUANT, S: 4399 m[IU]/mL — AB (ref <5)
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
## 2017-12-13 ENCOUNTER — Inpatient Hospital Stay (HOSPITAL_COMMUNITY)
Admission: AD | Admit: 2017-12-13 | Discharge: 2017-12-13 | Disposition: A | Discharge status: Home / Self Care | Payer: Medicaid Other | Source: Ambulatory Visit | Attending: Obstetrics & Gynecology | Admitting: Obstetrics & Gynecology

## 2017-12-13 ENCOUNTER — Ambulatory Visit (HOSPITAL_COMMUNITY)
Admission: RE | Admit: 2017-12-13 | Discharge: 2017-12-13 | Disposition: A | Discharge status: Home / Self Care | Payer: Medicaid Other | Source: Ambulatory Visit | Attending: Obstetrics & Gynecology | Admitting: Obstetrics & Gynecology

## 2017-12-13 ENCOUNTER — Other Ambulatory Visit: Payer: Self-pay

## 2017-12-13 ENCOUNTER — Ambulatory Visit (INDEPENDENT_AMBULATORY_CARE_PROVIDER_SITE_OTHER): Payer: Medicaid Other | Admitting: General Practice

## 2017-12-13 ENCOUNTER — Encounter: Payer: Self-pay | Admitting: General Practice

## 2017-12-13 ENCOUNTER — Ambulatory Visit: Payer: Self-pay

## 2017-12-13 DIAGNOSIS — Z3A01 Less than 8 weeks gestation of pregnancy: Secondary | ICD-10-CM | POA: Diagnosis not present

## 2017-12-13 DIAGNOSIS — O0281 Inappropriate change in quantitative human chorionic gonadotropin (hCG) in early pregnancy: Secondary | ICD-10-CM

## 2017-12-13 DIAGNOSIS — O3680X0 Pregnancy with inconclusive fetal viability, not applicable or unspecified: Secondary | ICD-10-CM

## 2017-12-13 LAB — HCG, QUANTITATIVE, PREGNANCY: hCG, Beta Chain, Quant, S: 4186 m[IU]/mL — ABNORMAL HIGH (ref <5)

## 2017-12-13 NOTE — Progress Notes (Signed)
Per Dr. Marice Potterove pt needs to have a STAT US scheduled beta level result abnormal.   STAT  US scheduled and pt walked to radiology.  Radiology informed to please bring pt back to the office for her results.

## 2017-12-13 NOTE — Progress Notes (Signed)
Here for stat bhcg. Denies pain or bleeding.  Explained we will draw stat bhcg and have her wait in lobby for results which we will then discuss with provider and then her. She voices understanding.

## 2017-12-13 NOTE — Progress Notes (Signed)
Patient was sent for stat ultrasound & returned to office.   Reviewed results with Vonzella NippleJulie Wenzel who finds progression in ultrasound compared to last but slight decrease in bhcg levels- too early to confirm eventual SAB or normal progressing pregnancy. Patient needs follow up ultrasound in 5-6 days. Scheduled for 6/25 @ 9am & patient informed.

## 2017-12-13 NOTE — MAU Note (Signed)
Pt here for F/U BHCG, had appointment in clinic this morning, could not make it.  Pt denies pain or bleeding.

## 2017-12-13 NOTE — Progress Notes (Signed)
Reviewed results from today and recent visits regarding this pregnancy. No evidence of ectopic today. Minimal progression of pregnancy, still IUGS only with irregular shape. Patient counseled on likelihood of failed pregnancy, however since it has only been 5 days from previous US diagnosis cannot be made today. Quant hCG levels have decreased today from 48 hours ago, but only slightly. Rn to schedule US for 10 days from first US to confirm viability vs blighted ovum. Patient will return to MAU for heavy bleeding, pain or fever if prior to next US on Tuesday. All questions answered. Patient agrees with plan of care. Will return to [facility] for results following next US.   Marny LowensteinWenzel, Cordelro Gautreau N, PA-C 12/13/2017 3:58 PM

## 2017-12-13 NOTE — MAU Note (Signed)
Clinic contacted by M. Bhambri CNM, OK for pt to go to clinic today @ 1300 for repeat labs.  Plan discussed with pt, she verbalizes understanding.  Will return to clinic @ 1300.

## 2017-12-16 ENCOUNTER — Ambulatory Visit (INDEPENDENT_AMBULATORY_CARE_PROVIDER_SITE_OTHER): Payer: Medicaid Other | Admitting: Internal Medicine

## 2017-12-16 ENCOUNTER — Encounter: Payer: Self-pay | Admitting: Internal Medicine

## 2017-12-16 VITALS — BP 110/78 | HR 100 | Ht 68.0 in | Wt 166.0 lb

## 2017-12-16 DIAGNOSIS — J4540 Moderate persistent asthma, uncomplicated: Secondary | ICD-10-CM | POA: Diagnosis not present

## 2017-12-16 LAB — NITRIC OXIDE: Nitric Oxide: 7

## 2017-12-16 MED ORDER — BUDESONIDE-FORMOTEROL FUMARATE 80-4.5 MCG/ACT IN AERO: 2.0000 | INHALATION_SPRAY | Freq: Two times a day (BID) | RESPIRATORY_TRACT | 0 refills | Status: DC

## 2017-12-16 MED ORDER — BUDESONIDE-FORMOTEROL FUMARATE 80-4.5 MCG/ACT IN AERO: 2.0000 | INHALATION_SPRAY | Freq: Two times a day (BID) | RESPIRATORY_TRACT | 11 refills | Status: DC

## 2017-12-16 NOTE — Progress Notes (Signed)
@Patient  ID: Crystal Sampson, female    DOB: 04-20-1976 .   MRN: [id number]      HPI: [id number]   followed for asthma and AR , high IgE  Studies :    [name] 2001: FEV1 2.38 (68%), ratio 80.  AP 03/20/2004-total IgE 1977.5 with broad elevation of almost all allergens and extremely high level against dog dander [name] 04/2012:  FEV1 1.72 (58%), ratio 68 No response to singulair.  - changed advair to [facility] 12/15/2014 > changed to Dulera 100 due to insurance restrictions 07/08/2015  Allergy Profile 07/17/2012-total IgE 1144.9 with elevation of all potential allergens on the panel Allergy Skin Test- 03/25/13- significant positive for grass weed and tree pollens dust mite, cat   07/17/2016 follow-up asthma. NP  Patient returns for a three-month follow-up. Patient says she is doing well with her asthma. She denies any flare of wheezing or cough. Patient does get Dulera through patient assistance program. She says that she will be changing her insurance soon and would like her patient assistance papers filled back out. She does complain of some nasal drip and stuffiness intermittently. She's been using some over-the-counter nasal sprays. Denies any fever or discolored mucus, chest pain, orthopnea, PND, or increased leg swelling. She denies any increased use of albuterol. rec Continue on Dulera 2 puffs Twice daily  , rinse after use.  Saline nasal rinses As needed   Begin Flonase 1 puff Twice daily     02/14/2017  f/u ov/Crystal Sampson re: asthma/ atopic  Chief Complaint  Patient presents with  . Follow-up    Breathing is overall doing well. She rarely uses albuterol inhaler or neb.    despite very poor and inconsistent hfa technique no daytime or noct symptoms or need for hfa rec Plan A = Automatic =  Dulera 100 Take 2 puffs first thing in am and then another 2 puffs about 12 hours later.  Work on inhaler technique:   Plan B = Backup Only use your albuterol (ventolin)  Plan C = Crisis - only use your  albuterol nebulizer if you first try Plan B and it fails to help > ok to use the nebulizer up to every 4 hours but if start needing it regularly call for immediate appointment   04/17/2017 acute extended ov/Crystal Sampson re: atopic asthma with cough flare Chief Complaint  Patient presents with  . Acute Visit    Increased cough and congestion x 2 wks. She states cough is waking her up in the night and she is coughing so hard she is vomiting. She is coughing up green sputum.  She is using her albuterol inhaler 3-4 x per wk, and has not needed neb.   fine 2 weeks prior to OV  On just on dulera 100 Take 2 puffs first thing in am and then another 2 puffs about 12 hours later.  Getting dulera directly from [employer] contact 2 week prior to OV  > sev days later developed  sore throat dry cough / rx mucinex  Worse at hs than daytime s nasal congestion / Mucus light yellow turning to green  No rescue in hand but never takes more than one a day and could not tell me what the written action plan has for saba use  rec For cough > mucinex dm 1200 mg every 12 hours and supplement with tramadol 50 mg every 4 hours as needed Work on inhaler technique:  Prednisone 10 mg take  4 each am x 2  days,   2 each am x 2 days,  1 each am x 2 days and stop  Doxycycline  100 mg twice twice daily x 10 days  If the cough continues >   You will need to add Try prilosec otc 20mg   Take 30-60 min before first meal of the day and Pepcid ac (famotidine) 20 mg one @  bedtime until cough is completely gone for at least a week without the need for cough suppression GERD  Keep your follow up appt to see me in November but call sooner if needed. > did not return as req    12/16/2017 acute extended ov/Crystal Sampson re: sob in pt with asthma on dulera 100 2bid  Chief Complaint  Patient presents with  . Acute Visit    Increased SOB due to stress x 2 wks. She states occ feels some pressure in her chest. She is using her albuterol inhaler about every  other day and she has used neb x 1 in the past 2 wks.   more chest tight/sob x 2 weeks no better either saba or neb  Waking up thru the night can't breath resolves s rx p few minutes s saba Mornings are not all that bad / uses dulera w/in 30 min of rising  Cough not an issue    Has medicaid due to 6 weeks IUP  - last 16 years prior to OV  Asthma poorly controlled during that time    No obvious day to day or daytime variability or assoc excess/ purulent sputum or mucus plugs or hemoptysis or cp or chest tightness, subjective wheeze or overt sinus or hb symptoms.    Also denies any obvious fluctuation of symptoms with weather or environmental changes or other aggravating or alleviating factors except as outlined above   No unusual exposure hx or h/o childhood pna/ asthma or knowledge of premature birth.  Current Allergies, Complete Past Medical History, Past Surgical History, Family History, and Social History were reviewed in [facility] electronic medical record.  ROS  The following are not active complaints unless bolded Hoarseness, sore throat, dysphagia, dental problems, itching, sneezing,  nasal congestion or discharge of excess mucus or purulent secretions, ear ache,   fever, chills, sweats, unintended wt loss or wt gain, classically pleuritic or exertional cp,  orthopnea pnd or arm/hand swelling  or leg swelling, presyncope, palpitations, abdominal pain, anorexia, nausea, vomiting, diarrhea  or change in bowel habits or change in bladder habits, change in stools or change in urine, dysuria, hematuria,  rash, arthralgias, visual complaints, headache, numbness, weakness or ataxia or problems with walking or coordination,  change in mood= depression  or  memory.        Current Meds  Medication Sig  . albuterol (PROAIR HFA) 108 (90 Base) MCG/ACT inhaler 2 puffs every 4 hours as needed only  if your can't catch your breath  . albuterol (PROVENTIL) (2.5 MG/3ML) 0.083% nebulizer solution  Take 6 mLs (5 mg total) by nebulization every 6 (six) hours as needed for wheezing or shortness of breath. DX: J45.40  .   mometasone-formoterol (DULERA) 100-5 MCG/ACT AERO Inhale 2 puffs into the lungs 2 (two) times daily.                          Physical Exam  amb somber bf nad   12/16/2017       166   04/17/2017    198   02/14/17 204  lb 12.8 oz (92.9 kg)  07/17/16 208 lb 3.2 oz (94.4 kg)  03/27/16 178 lb (80.7 kg)    Vital signs reviewed - Note on arrival 02 sats  96% on RA     HEENT: nl dentition,  and oropharynx. Nl external ear canals without cough reflex - moderate bilateral non-specific turbinate edema    NECK :  without JVD/Nodes/TM/ nl carotid upstrokes bilaterally   LUNGS: no acc muscle use,  Nl contour chest which is clear to A and P bilaterally without cough on insp or exp maneuvers   CV:  RRR  no s3 or murmur or increase in P2, and no edema   ABD: obese/ c/w iup stated age  soft and nontender with nl inspiratory excursion in the supine position. No bruits or organomegaly appreciated, bowel sounds nl  MS:  Nl gait/ ext warm without deformities, calf tenderness, cyanosis or clubbing No obvious joint restrictions   SKIN: warm and dry without lesions    NEURO:  alert, approp, nl sensorium with  no motor or cerebellar deficits apparent.       Assessment & Plan:

## 2017-12-16 NOTE — Patient Instructions (Addendum)
Plan A = Automatic = change to symbicort 80 Take 2 puffs first thing in am and then another 2 puffs about 12 hours later.     Work on inhaler technique:  relax and gently blow all the way out then take a nice smooth deep breath back in, triggering the inhaler at same time you start breathing in.  Hold for up to 5 seconds if you can. Blow out thru nose. Rinse and gargle with water when done      Plan B = Backup Only use your albuterol as a rescue medication to be used if you can't catch your breath by resting or doing a relaxed purse lip breathing pattern.  - The less you use it, the better it will work when you need it. - Ok to use the inhaler up to 2 puffs  every 4 hours if you must but call for appointment if use goes up over your usual need - Don't leave home without it !!  (think of it like the spare tire for your car)   Plan C = Crisis - only use your albuterol nebulizer if you first try Plan B and it fails to help > ok to use the nebulizer up to every 4 hours but if start needing it regularly call for immediate appointment    Please schedule a follow up office visit in 4 weeks, sooner if needed  with all medications /inhalers/ solutions in hand so we can verify exactly what you are taking. This includes all medications from all doctors and over the counters

## 2017-12-17 ENCOUNTER — Ambulatory Visit (INDEPENDENT_AMBULATORY_CARE_PROVIDER_SITE_OTHER): Payer: Medicaid Other | Admitting: General Practice

## 2017-12-17 ENCOUNTER — Ambulatory Visit (HOSPITAL_COMMUNITY): Payer: Medicaid Other

## 2017-12-17 ENCOUNTER — Ambulatory Visit: Payer: Self-pay

## 2017-12-17 ENCOUNTER — Ambulatory Visit (HOSPITAL_COMMUNITY)
Admission: RE | Admit: 2017-12-17 | Discharge: 2017-12-17 | Disposition: A | Discharge status: Home / Self Care | Payer: Medicaid Other | Source: Ambulatory Visit | Attending: Obstetrics & Gynecology | Admitting: Obstetrics & Gynecology

## 2017-12-17 DIAGNOSIS — O3680X0 Pregnancy with inconclusive fetal viability, not applicable or unspecified: Secondary | ICD-10-CM

## 2017-12-17 DIAGNOSIS — O283 Abnormal ultrasonic finding on antenatal screening of mother: Secondary | ICD-10-CM

## 2017-12-17 LAB — HCG, QUANTITATIVE, PREGNANCY: hCG, Beta Chain, Quant, S: 3950 m[IU]/mL — ABNORMAL HIGH

## 2017-12-17 NOTE — Progress Notes (Signed)
Patient presents to office today for stat bhcg. Patient denies pain or bleeding at this time. Patient has ultrasound scheduled for tomorrow morning. Per chart review, no mention of repeat stat bhcg was indicated. Patient requests stat bhcg because she wants to know what her numbers are today. Discussed her bhcg levels today will likely not change the plan for ultrasound tomorrow, but we will see what they are. Patient verbalized understanding & had no questions at this time. Patient will wait for results in office.

## 2017-12-17 NOTE — Progress Notes (Signed)
Returned from US as ordered by Dr. Macon LargeAnyanwu. Reviewed US results, bhcg results and chart with Dr. Jolayne Pantheronstant. Informed patient this does not look like a normal pregnancy and may be a blighted ovum. There has been no real change in her US results and bhcg has stayed about the same, dropped slightly. Recommend go to [facility] for methotrexate.  Patient requests to wait a few more days.  Discussed with Dr. Jolayne Pantheronstant and patient must come to [facility] Sunday 12/22/17 or sooner if she has heavy bleeding or severe pain. Stressed [facility] or [facility] Sunday if she has not had any pain/bleeding. She voices understanding. Called [facility] to notify.

## 2017-12-18 ENCOUNTER — Encounter: Payer: Self-pay | Admitting: Internal Medicine

## 2017-12-18 ENCOUNTER — Ambulatory Visit (HOSPITAL_COMMUNITY): Payer: Medicaid Other

## 2017-12-18 NOTE — Progress Notes (Signed)
I have reviewed the chart and agree with nursing staff's documentation of this patient's encounter.  Catalina AntiguaPeggy Kolter Reaver, MD 12/18/2017 2:01 PM

## 2017-12-18 NOTE — Assessment & Plan Note (Signed)
AP 03/20/2004  IgE 1877, RAST very abnormal 2007. 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 Acute exacerbation/hospitalization 03/2012 Cleda DaubSpiro 04/2012:  FEV1 1.72 (58%), ratio 68 No response to singulair.  - [facility]  - 04/17/2017  extensive coaching HFA effectiveness =    75%  From a baseline of 50%  - Spirometry 12/16/2017  FEV1 2.14 (74%)  Ratio 79 p am dulera 100 x 2  - FENO 12/16/2017  =   7 p dulera 100 x 2  - 12/16/2017  After extensive coaching inhaler device  effectiveness =  75% and 6 weeks iup > change to symb  80 2bid   Doubt present symptoms due to asthma but she is at risk based on prior hx and poor insight.  Discussed IUP and risk to fetus from meds vs hypeventilation vs poorly controlled asthma in detail   Discussed in detail all the  indications, usual  risks and alternatives  relative to the benefits with patient who agrees to proceed with transition to symbicort 80 2bid since budesonide has best rating for IUP  I had an extended discussion with the patient reviewing all relevant studies completed to date and  lasting 25 minutes of a 40  minute office visit to address asthma management at age 42 in what likely will be a very high risk pregnancy and address       non-specific but potentially very serious refractory respiratory symptoms of uncertain and potentially multiple  etiologies.  See device teaching which extended face to face time for this visit   Each maintenance medication was reviewed in detail including most importantly the difference between maintenance and prns and under what circumstances the prns are to be triggered using an action plan format that is not reflected in the computer generated alphabetically organized AVS.    Please see AVS for specific instructions unique to this office visit that I personally wrote and verbalized to the the pt in detail and then  reviewed with pt  by my nurse highlighting any changes in therapy/plan of care  recommended at today's visit.

## 2018-01-13 ENCOUNTER — Ambulatory Visit: Payer: Medicaid Other | Admitting: Internal Medicine

## 2018-01-13 ENCOUNTER — Encounter: Payer: Self-pay | Admitting: Internal Medicine

## 2018-01-13 ENCOUNTER — Encounter: Payer: Self-pay | Admitting: Advanced Practice Midwife

## 2018-01-13 ENCOUNTER — Ambulatory Visit: Payer: Self-pay | Admitting: Internal Medicine

## 2018-01-13 VITALS — BP 118/68 | HR 97 | Ht 68.0 in | Wt 162.0 lb

## 2018-01-13 DIAGNOSIS — J4540 Moderate persistent asthma, uncomplicated: Secondary | ICD-10-CM | POA: Diagnosis not present

## 2018-01-13 NOTE — Patient Instructions (Addendum)
Symbicort  Or   [facility] SleightDulera can be used up to 2 every 12 hours if having any symptoms  - whichever you can acquire is fine  Work on inhaler technique:  relax and gently blow all the way out then take a nice smooth deep breath back in, triggering the inhaler at same time you start breathing in.  Hold for up to 5 seconds if you can. Blow out thru nose. Rinse and gargle with water when done     Please remember to go to the lab department downstairs in the basement  for your tests - we will call you with the results when they are available.       Please schedule a follow up visit in 3 months but call sooner if needed

## 2018-01-13 NOTE — Progress Notes (Signed)
@Patient  ID: Crystal Sampson, female    DOB: [birth year] .   MRN: [id number]      HPI: 41 yobf   followed for asthma and AR , high IgE  Studies :    Crystal Sampson 2001: FEV1 2.38 (68%), ratio 80.  AP 03/20/2004-total IgE 1977.5 with broad elevation of almost all allergens and extremely high level against dog dander Crystal Sampson 04/2012:  FEV1 1.72 (58%), ratio 68 No response to singulair.  - changed advair to [facility] 12/15/2014 > changed to Dulera 100 due to insurance restrictions 07/08/2015  Allergy Profile 07/17/2012-total IgE 1144.9 with elevation of all potential allergens on the panel Allergy Skin Test- 03/25/13- significant positive for grass weed and tree pollens dust mite, cat   07/17/2016 follow-up asthma. NP  Patient returns for a three-month follow-up. Patient says she is doing well with her asthma. She denies any flare of wheezing or cough. Patient does get Dulera through patient assistance program. She says that she will be changing her insurance soon and would like her patient assistance papers filled back out. She does complain of some nasal drip and stuffiness intermittently. She's been using some over-the-counter nasal sprays. Denies any fever or discolored mucus, chest pain, orthopnea, PND, or increased leg swelling. She denies any increased use of albuterol. rec Continue on Dulera 2 puffs Twice daily  , rinse after use.  Saline nasal rinses As needed   Begin Flonase 1 puff Twice daily     02/14/2017  f/u ov/Crystal Sampson re: asthma/ atopic  Chief Complaint  Patient presents with  . Follow-up    Breathing is overall doing well. She rarely uses albuterol inhaler or neb.    despite very poor and inconsistent hfa technique no daytime or noct symptoms or need for hfa rec Plan A = Automatic =  Dulera 100 Take 2 puffs first thing in am and then another 2 puffs about 12 hours later.  Work on inhaler technique:   Plan B = Backup Only use your albuterol (ventolin)  Plan C = Crisis - only use your  albuterol nebulizer if you first try Plan B and it fails to help > ok to use the nebulizer up to every 4 hours but if start needing it regularly call for immediate appointment   04/17/2017 acute extended ov/Crystal Sampson re: atopic asthma with cough flare Chief Complaint  Patient presents with  . Acute Visit    Increased cough and congestion x 2 wks. She states cough is waking her up in the night and she is coughing so hard she is vomiting. She is coughing up green sputum.  She is using her albuterol inhaler 3-4 x per wk, and has not needed neb.   fine 2 weeks prior to OV  On just on dulera 100 Take 2 puffs first thing in am and then another 2 puffs about 12 hours later.  Getting dulera directly from [employer] contact 2 week prior to OV  > sev days later developed  sore throat dry cough / rx mucinex  Worse at hs than daytime s nasal congestion / Mucus light yellow turning to green  No rescue in hand but never takes more than one a day and could not tell me what the written action plan has for saba use  rec For cough > mucinex dm 1200 mg every 12 hours and supplement with tramadol 50 mg every 4 hours as needed Work on inhaler technique:  Prednisone 10 mg take  4 each am x 2  days,   2 each am x 2 days,  1 each am x 2 days and stop  Doxycycline  100 mg twice twice daily x 10 days  If the cough continues >   You will need to add Try prilosec otc 20mg   Take 30-60 min before first meal of the day and Pepcid ac (famotidine) 20 mg one @  bedtime until cough is completely gone for at least a week without the need for cough suppression GERD  Keep your follow up appt to see me in November but call sooner if needed. > did not return as req    12/16/2017 acute extended ov/Crystal Sampson re: sob in pt with asthma on dulera 100 2bid  Chief Complaint  Patient presents with  . Acute Visit    Increased SOB due to stress x 2 wks. She states occ feels some pressure in her chest. She is using her albuterol inhaler about every  other day and she has used neb x 1 in the past 2 wks.   more chest tight/sob x 2 weeks no better either saba or neb  Waking up thru the night can't breath resolves s rx p few minutes s saba Mornings are not all that bad / uses dulera w/in 30 min of rising  Cough not an issue  Has medicaid due to 6 weeks IUP  - last 16 years prior to OV  Asthma poorly controlled during that time rec Plan A = Automatic = change to symbicort 80 Take 2 puffs first thing in am and then another 2 puffs about 12 hours later.  Work on inhaler technique:  Plan B = Backup Only use your albuterol as a rescue medication  Plan C = Crisis - only use your albuterol nebulizer if you first try Plan B  Please schedule a follow up office visit in 4 weeks, sooner if needed  with all medications /inhalers/ solutions in hand so we can verify exactly what you are taking. This includes all medications from all doctors and over the counters     01/13/2018  f/u ov/Crystal Sampson re: asthma in ? Ongoing IUP  Chief Complaint  Patient presents with  . Follow-up    Breathing is doing well. She has not needed her albuterol inhaler or neb.   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   Sleeping flat without nocturnal  or early am exacerbation  of respiratory  c/o's or need for noct saba. Also denies any obvious fluctuation of symptoms with weather or environmental changes or other aggravating or alleviating factors except as outlined above   No unusual exposure hx or h/o childhood pna/ asthma or knowledge of premature birth.  Current Allergies, Complete Past Medical History, Past  Surgical History, Family History, and Social History were reviewed in [employer] record.  ROS  The following are not active complaints unless bolded Hoarseness, sore throat, dysphagia, dental problems, itching, sneezing,  nasal congestion or discharge of excess mucus or purulent secretions, ear ache,   fever, chills, sweats, unintended wt loss or wt gain, classically  pleuritic or exertional cp,  orthopnea pnd or arm/hand swelling  or leg swelling, presyncope, palpitations, abdominal pain, anorexia, nausea, vomiting, diarrhea  or change in bowel habits or change in bladder habits, change in stools or change in urine, dysuria, hematuria,  rash, arthralgias, visual complaints, headache, numbness, weakness or ataxia or problems with walking or coordination,  change in mood or  memory.        Current Meds  Medication Sig  . albuterol (PROAIR HFA) 108 (90 Base) MCG/ACT inhaler 2 puffs every 4 hours as needed only  if your can't catch your breath  . albuterol (PROVENTIL) (2.5 MG/3ML) 0.083% nebulizer solution Take 6 mLs (5 mg total) by nebulization every 6 (six) hours as needed for wheezing or shortness of breath. DX: J45.40  . budesonide-formoterol (SYMBICORT) 80-4.5 MCG/ACT inhaler Inhale 2 puffs into the lungs 2 (two) times daily.                 Physical Exam  Amb bf nad   01/13/2018       162  12/16/2017       166   04/17/2017    198   02/14/17 204 lb 12.8 oz (92.9 kg)  07/17/16 208 lb 3.2 oz (94.4 kg)  03/27/16 178 lb (80.7 kg)    Vital signs reviewed - Note on arrival 02 sats  96% on RA      HEENT: nl dentition,   and oropharynx. Nl external ear canals without cough reflex -moderate bilateral non-specific turbinate edema     NECK :  without JVD/Nodes/TM/ nl carotid upstrokes bilaterally   LUNGS: no acc muscle use,  Nl contour chest which is clear to A and P bilaterally without cough on insp or exp maneuvers   CV:  RRR  no s3 or murmur or increase in  P2, and no edema   ABD:  soft and nontender with nl inspiratory excursion in the supine position. No bruits or organomegaly appreciated, bowel sounds nl  MS:  Nl gait/ ext warm without deformities, calf tenderness, cyanosis or clubbing No obvious joint restrictions   SKIN: warm and dry without lesions    NEURO:  alert, approp, nl sensorium with  no motor or cerebellar deficits apparent.      Labs ordered 01/13/2018   Allergy profile       Assessment & Plan:

## 2018-01-13 NOTE — Assessment & Plan Note (Addendum)
AP 03/20/2004  IgE 1877, RAST very abnormal 2007. 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 Acute exacerbation/hospitalization 03/2012 Arlyce Harman 04/2012:  FEV1 1.72 (58%), ratio 68 No response to singulair.  - changed advair to [facility] 12/15/2014 > changed to [facility] 100 due to insurance restrictions 07/08/2015  - 04/17/2017  extensive coaching HFA effectiveness =    75%  From a baseline of 50% - Spirometry 12/16/2017  FEV1 2.14 (74%)  Ratio 79 p am dulera 100 x 2  - FENO 12/16/2017  =   7 p dulera 100 x 2  - 12/16/2017  After extensive coaching inhaler device  effectiveness =    75% and 6 weeks iup > change to symb  80 2bid   - 01/13/2018  After extensive coaching inhaler device  effectiveness =    25% - Allergy profile 01/13/2018 >  Eos 0. /  IgE  Pending   Despite poor hfa, All goals of chronic asthma control met including optimal function and elimination of symptoms with minimal need for rescue therapy.  Contingencies discussed in full including contacting this office immediately if not controlling the symptoms using the rule of two's.     Ok to change to symbicort prn based on :  Based on two studies from NEJM  378; 20 p 1865 (2018) and 380 : p2020-30 (2019) in pts with mild asthma it is reasonable to use low dose symbicort eg 80 2bid "prn" flare in this setting but I emphasized this was only shown with symbicort and takes advantage of the rapid onset of action but is not the same as "rescue therapy" but can be stopped once the acute symptoms have resolved and the need for rescue has been minimized (< 2 x weekly)     I had an extended discussion with the patient reviewing all relevant studies completed to date and  lasting 15 to 20 minutes of a 25 minute visit    See device teaching which extended face to face time for this visit.  Each maintenance medication was reviewed in detail including emphasizing most importantly the difference between maintenance and prns and under what  circumstances the prns are to be triggered using an action plan format that is not reflected in the computer generated alphabetically organized AVS which I have not found useful in most complex patients, especially with respiratory illnesses  Please see AVS for specific instructions unique to this visit that I personally wrote and verbalized to the the pt in detail and then reviewed with pt  by my nurse highlighting any  changes in therapy recommended at today's visit to their plan of care.    - Allergy profile 01/13/2018 >  Eos 0. /  IgE

## 2018-02-03 ENCOUNTER — Ambulatory Visit (INDEPENDENT_AMBULATORY_CARE_PROVIDER_SITE_OTHER): Payer: Medicaid Other | Admitting: Clinical

## 2018-02-03 ENCOUNTER — Ambulatory Visit (INDEPENDENT_AMBULATORY_CARE_PROVIDER_SITE_OTHER): Payer: Medicaid Other | Admitting: Advanced Practice Midwife

## 2018-02-03 ENCOUNTER — Encounter: Payer: Self-pay | Admitting: Advanced Practice Midwife

## 2018-02-03 ENCOUNTER — Other Ambulatory Visit: Payer: Self-pay | Admitting: Advanced Practice Midwife

## 2018-02-03 VITALS — BP 115/85 | HR 94 | Wt 158.0 lb

## 2018-02-03 DIAGNOSIS — O021 Missed abortion: Secondary | ICD-10-CM

## 2018-02-03 DIAGNOSIS — O09529 Supervision of elderly multigravida, unspecified trimester: Secondary | ICD-10-CM | POA: Insufficient documentation

## 2018-02-03 DIAGNOSIS — O0990 Supervision of high risk pregnancy, unspecified, unspecified trimester: Secondary | ICD-10-CM | POA: Insufficient documentation

## 2018-02-03 DIAGNOSIS — F4323 Adjustment disorder with mixed anxiety and depressed mood: Secondary | ICD-10-CM | POA: Diagnosis not present
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
## 2018-02-03 NOTE — BH Specialist Note (Signed)
Integrated Behavioral Health Initial Visit  MRN: [id number] Name: Crystal Sampson  Number of Integrated Behavioral Health Clinician visits:: 1/6 Session Start time: 2:42  Session End time: 3:06 Total time: 20 minutes  Type of Service: Integrated Behavioral Health- Individual/Family Interpretor:No. Interpretor Name and Language: n/a   Warm Hand Off Completed.       SUBJECTIVE: Crystal PeanBelinda G Shaheed is a 42 y.o. female accompanied by n/a Patient was referred by Thressa ShellerHeather Hogan, CNM for symptoms of anxiety and depression. Patient reports the following symptoms/concerns: Pt states that her primary concern has been feeling depressed and anxious over whether or not she is experiencing a viable pregnancy; it helps to talk through her feelings today, prior to deciding what to say to FOB.  Duration of problem: Less than one month; Severity of problem: moderately severe  OBJECTIVE: Mood: Anxious and Depressed and Affect: Appropriate Risk of harm to self or others: No plan to harm self or others  LIFE CONTEXT: Family and Social: Pt lives with her two teenage children; helps care for her 60yo mother. Boyfriend supportive  School/Work: Pt works full-time Self-Care: - Life Changes:   GOALS ADDRESSED: Patient will: 1. Reduce symptoms of: anxiety and depression 2. Increase knowledge and/or ability of: stress reduction  3. Demonstrate ability to: Increase healthy adjustment to current life circumstances  INTERVENTIONS: Interventions utilized: Supportive Counseling and Psychoeducation and/or Health Education  Standardized Assessments completed: GAD-7 and PHQ 9  ASSESSMENT: Patient currently experiencing Adjustment disorder with mixed anxious and depressed mood    Patient may benefit from psychoeducation and brief therapeutic interventions regarding coping with symptoms of anxiety and depression  PLAN: 1. Follow up with behavioral health clinician on : As needed 2. Behavioral recommendations:   -Continue with upcoming ultrasound appointment on 02-06-18; discuss results with medical provider -Continue to use coping strategies that have worked in the past; consider additional strategies discussed in office visit -Read educational materials regarding coping with symptoms of anxiety and depression  3. Referral(s): Integrated Behavioral Health Services (In Clinic) 4. "From scale of 1-10, how likely are you to follow plan?": -  Rae LipsJamie C Dinh Ayotte, LCSW  Depression screen [facility] 2/9 02/03/2018  Decreased Interest 3  Down, Depressed, Hopeless 3  PHQ - 2 Score 6  Altered sleeping 2  Tired, decreased energy 2  Change in appetite 1  Feeling bad or failure about yourself  3  Trouble concentrating 0  Moving slowly or fidgety/restless 0  Suicidal thoughts 0  PHQ-9 Score 14   GAD 7 : Generalized Anxiety Score 02/03/2018  Nervous, Anxious, on Edge 3  Control/stop worrying 3  Worry too much - different things 3  Trouble relaxing 3  Restless 2  Easily annoyed or irritable 2  Afraid - awful might happen 3  Total GAD 7 Score 19

## 2018-02-06 ENCOUNTER — Ambulatory Visit (HOSPITAL_COMMUNITY): Payer: Medicaid Other

## 2018-02-06 LAB — URINE CULTURE, OB REFLEX

## 2018-02-06 LAB — CULTURE, OB URINE

## 2018-02-08 MED ORDER — NITROFURANTOIN MONOHYD MACRO 100 MG PO CAPS
100.0000 mg | ORAL_CAPSULE | Freq: Two times a day (BID) | ORAL | 0 refills | Status: DC
Start: 2018-02-08 — End: 2018-10-22

## 2018-02-08 NOTE — Addendum Note (Signed)
Addended by: Thressa ShellerHOGAN, Jeanpaul Biehl D on: 02/08/2018 08:26 PM   Modules accepted: Orders

## 2018-02-10 ENCOUNTER — Ambulatory Visit (INDEPENDENT_AMBULATORY_CARE_PROVIDER_SITE_OTHER): Payer: Medicaid Other | Admitting: Advanced Practice Midwife

## 2018-02-10 ENCOUNTER — Ambulatory Visit (HOSPITAL_COMMUNITY)
Admission: RE | Admit: 2018-02-10 | Discharge: 2018-02-10 | Disposition: A | Discharge status: Home / Self Care | Payer: Medicaid Other | Source: Ambulatory Visit | Attending: Advanced Practice Midwife | Admitting: Advanced Practice Midwife

## 2018-02-10 ENCOUNTER — Other Ambulatory Visit (HOSPITAL_COMMUNITY): Payer: Self-pay | Admitting: Diagnostic Radiology

## 2018-02-10 ENCOUNTER — Telehealth: Payer: Self-pay | Admitting: *Deleted

## 2018-02-10 ENCOUNTER — Ambulatory Visit (INDEPENDENT_AMBULATORY_CARE_PROVIDER_SITE_OTHER): Payer: Medicaid Other | Admitting: *Deleted

## 2018-02-10 ENCOUNTER — Encounter: Payer: Self-pay | Admitting: Advanced Practice Midwife

## 2018-02-10 VITALS — BP 139/80 | HR 82 | Wt 157.0 lb

## 2018-02-10 DIAGNOSIS — O3680X0 Pregnancy with inconclusive fetal viability, not applicable or unspecified: Secondary | ICD-10-CM | POA: Diagnosis not present

## 2018-02-10 DIAGNOSIS — O021 Missed abortion: Secondary | ICD-10-CM | POA: Diagnosis present

## 2018-02-10 DIAGNOSIS — Z712 Person consulting for explanation of examination or test findings: Secondary | ICD-10-CM

## 2018-02-10 NOTE — Progress Notes (Signed)
8/19 0940  US results reviewed by Dr. Alysia PennaErvin. He recommends pt return later today to see provider for management of failed pregnancy. I spoke w/pt and advised of US results showing embryo w/o cardiac activity and this is indicative of no pregnancy progression since 6/25 and thus a failed pregnancy. Pt then stated that she is very confused and that no one is listening to her. She proceeded to tell her story stating the Crystalyn Delia after she was last seen following US (6/25) she began having heavy vaginal bleeding and passing clots as she was told was likely to happen. She also passed something that was the size of half her thumb, hard and curved in shape. She proceeded to bleed for 18 days. Then she had a period which began on 7/24 and was for the most part normal in nature. She denies having bleeding now. Pt referenced her phone for this historical information as she was talking. Pt has not been using any type of birth control. [place] results from 6/25 and today again were reviewed by this RN. All of this information was shared with Dr. Alysia PennaErvin who agrees that it is unclear whether the current pregnancy is the same as in June or possibly a different pregnancy. At pt's last visit on 8/12, [facility] the test was not performed. Per contact w/[facility] the blood is still available and they will run the test as a STAT so that results will be available when pt returns today @ 1435. Per Dr. Alysia PennaErvin, if there is considerable concern that this is a different pregnancy, pt should have repeat Transvag US in 2 weeks. Total time spent w/pt = 40 minutes.

## 2018-02-10 NOTE — Telephone Encounter (Signed)
Left patient a voice mail that [facility]. Pt has a UTI. Advised patient to call [facility] with questions.  Clovis PuMartin, Bayan Hedstrom L, RN

## 2018-02-10 NOTE — Progress Notes (Signed)
  Subjective:     Patient ID: Crystal Sampson, female   DOB: 06/04/1976, 42 y.o.   MRN: [id number]  Crystal PeanBelinda G Sanagustin is 42 y.o. [id number] who is here today for FU. She was seen on 12/17/17 and was diagnosed with a MAB. She did not keep FU appts for HCG checks. She was scheduled for NOB last week, and had a FU US ordered. She reports that she bled from 6/26-7/7, and that she passed some tissue. She then had a period on 01/15/18. She states that she believes that she has a new pregnancy.     Review of Systems  All other systems reviewed and are negative.      Objective:   Physical Exam  Constitutional: She is oriented to person, place, and time. She appears well-developed and well-nourished. No distress.  HENT:  Head: Normocephalic.  Cardiovascular: Normal rate.  Pulmonary/Chest: Effort normal.  Neurological: She is alert and oriented to person, place, and time.  Skin: Skin is warm and dry.  Psychiatric: She has a normal mood and affect.  Nursing note and vitals reviewed.      Assessment:     1. Pregnancy with uncertain fetal viability, single or unspecified fetus       Plan:     HCG today and again in 48 hours FU US in 10-14 days

## 2018-02-10 NOTE — Telephone Encounter (Signed)
-----   Message from Armando ReichertHeather D Hogan, CNM sent at 02/08/2018  8:25 PM EDT ----- Patient with UTI. I have sent in a rx. Please call her.

## 2018-02-10 NOTE — Progress Notes (Signed)
Pt has appt this afternoon th discuss U/S results and develop POC

## 2018-02-11 ENCOUNTER — Other Ambulatory Visit: Payer: Self-pay

## 2018-02-11 DIAGNOSIS — O3680X0 Pregnancy with inconclusive fetal viability, not applicable or unspecified: Secondary | ICD-10-CM

## 2018-02-11 LAB — OBSTETRIC PANEL, INCLUDING HIV
Antibody Screen: NEGATIVE
BASOS ABS: 0 10*3/uL (ref 0.0–0.2)
Basos: 1 %
EOS (ABSOLUTE): 0.2 10*3/uL (ref 0.0–0.4)
Eos: 3 %
HEP B S AG: NEGATIVE
HIV Screen 4th Generation wRfx: NONREACTIVE
Hematocrit: 40.2 % (ref 34.0–46.6)
Hemoglobin: 13.5 g/dL (ref 11.1–15.9)
IMMATURE GRANULOCYTES: 0 %
Immature Grans (Abs): 0 10*3/uL (ref 0.0–0.1)
LYMPHS ABS: 2 10*3/uL (ref 0.7–3.1)
Lymphs: 30 %
MCH: 31.9 pg (ref 26.6–33.0)
MCHC: 33.6 g/dL (ref 31.5–35.7)
MCV: 95 fL (ref 79–97)
MONOS ABS: 0.4 10*3/uL (ref 0.1–0.9)
Monocytes: 6 %
NEUTROS PCT: 60 %
Neutrophils Absolute: 4.1 10*3/uL (ref 1.4–7.0)
PLATELETS: 402 10*3/uL (ref 150–450)
RBC: 4.23 x10E6/uL (ref 3.77–5.28)
RDW: 13.7 % (ref 12.3–15.4)
RPR: NONREACTIVE
Rh Factor: POSITIVE
Rubella Antibodies, IGG: 6 index (ref >0.99)
WBC: 6.7 10*3/uL (ref 3.4–10.8)

## 2018-02-11 LAB — HEMOGLOBINOPATHY EVALUATION
FERRITIN: 74 ng/mL (ref 15–150)
HGB F QUANT: 0 % (ref 0.0–2.0)
HGB SOLUBILITY: NEGATIVE
Hgb A2 Quant: 1.9 % (ref 1.8–3.2)
Hgb A: 98.1 % (ref 96.4–98.8)
Hgb C: 0 %
Hgb S: 0 %
Hgb Variant: 0 %

## 2018-02-11 LAB — CYSTIC FIBROSIS GENE TEST

## 2018-02-11 LAB — SMN1 COPY NUMBER ANALYSIS (SMA CARRIER SCREENING)

## 2018-02-11 LAB — BETA HCG QUANT (REF LAB): hCG Quant: <1 m[IU]/mL

## 2018-02-12 ENCOUNTER — Other Ambulatory Visit: Payer: Medicaid Other

## 2018-02-12 DIAGNOSIS — O3680X0 Pregnancy with inconclusive fetal viability, not applicable or unspecified: Secondary | ICD-10-CM

## 2018-02-13 LAB — BETA HCG QUANT (REF LAB): hCG Quant: <1 m[IU]/mL

## 2018-02-14 ENCOUNTER — Telehealth: Payer: Self-pay | Admitting: General Practice

## 2018-02-14 ENCOUNTER — Other Ambulatory Visit: Payer: Self-pay | Admitting: Obstetrics and Gynecology

## 2018-02-14 ENCOUNTER — Encounter: Payer: Self-pay | Admitting: Obstetrics and Gynecology

## 2018-02-14 ENCOUNTER — Other Ambulatory Visit: Payer: Medicaid Other

## 2018-02-14 DIAGNOSIS — Z3491 Encounter for supervision of normal pregnancy, unspecified, first trimester: Secondary | ICD-10-CM

## 2018-02-14 DIAGNOSIS — O3680X0 Pregnancy with inconclusive fetal viability, not applicable or unspecified: Secondary | ICD-10-CM

## 2018-02-14 DIAGNOSIS — R9389 Abnormal findings on diagnostic imaging of other specified body structures: Secondary | ICD-10-CM | POA: Insufficient documentation

## 2018-02-14 NOTE — Progress Notes (Signed)
Crystal Sampson presents to the clinic today for results from her recent Hcg levels and [place] levels. Patient says she is very confused and needs some explanation. Reviewed chart in detail with Dr. Ilda Basset. Reviewed previous US showing yolk sac and fetal pole on 8/19; reviewed images.  Quant 8/19: <1 Quant  8/21 <1  Per Dr. Ilda Basset the patient should have a transvaginal US with MFM early next week. Repeat Quant today in the clinic.  Patient will come down to the [facility] after [place] for results.Patient was notified of her [place] dating and time and she is agreeable with the plan of care.   Patient met with house coverage today to discuss her concerns.    Lezlie Lye, NP 02/14/2018 4:58 PM

## 2018-02-14 NOTE — Progress Notes (Signed)
Crystal Sampson presents

## 2018-02-14 NOTE — Telephone Encounter (Signed)
Called patient & informed her of ultrasound appt. Patient verbalized understanding & had no questions.

## 2018-02-15 LAB — BETA HCG QUANT (REF LAB): hCG Quant: <1 m[IU]/mL

## 2018-02-18 ENCOUNTER — Ambulatory Visit: Payer: Medicaid Other | Admitting: Obstetrics & Gynecology

## 2018-02-18 ENCOUNTER — Ambulatory Visit (HOSPITAL_COMMUNITY)
Admission: RE | Admit: 2018-02-18 | Discharge: 2018-02-18 | Disposition: A | Discharge status: Home / Self Care | Payer: Medicaid Other | Source: Ambulatory Visit | Attending: Advanced Practice Midwife | Admitting: Advanced Practice Midwife

## 2018-02-18 DIAGNOSIS — O09529 Supervision of elderly multigravida, unspecified trimester: Secondary | ICD-10-CM | POA: Diagnosis not present

## 2018-02-18 DIAGNOSIS — O3680X0 Pregnancy with inconclusive fetal viability, not applicable or unspecified: Secondary | ICD-10-CM

## 2018-02-18 DIAGNOSIS — O039 Complete or unspecified spontaneous abortion without complication: Secondary | ICD-10-CM

## 2018-02-18 DIAGNOSIS — Z3A00 Weeks of gestation of pregnancy not specified: Secondary | ICD-10-CM | POA: Insufficient documentation

## 2018-02-18 NOTE — Progress Notes (Signed)
History:  42 y.o. [id number] here today for eval of irreg menses. LMP 7/24 and then spotting 8/12.   Pt reprots being pregnant in June. She was told that it was a missed AB. She elected for sp passage of the tissues which did occur. She was then told that she needed an US and would require a d/c for an incomplete AB. She reports frustration at begin given this info as her HCG was <1. She denies pain or other sx. She was sent for another study this am to eval finding from prev US. Pt is asymptomatic but, frustrated and expresses anxiety and distrust over the care that she has received.     The following portions of the patient's history were reviewed and updated as appropriate: allergies, current medications, past family history, past medical history, past social history, past surgical history and problem list.  Review of Systems:  Pertinent items are noted in HPI.    Objective:  Physical Exam unknown if currently breastfeeding.  CONSTITUTIONAL: Well-developed, well-nourished female in no acute distress. Pt teary  HENT:  Normocephalic, atraumatic EYES: Conjunctivae and EOM are normal. No scleral icterus.  NECK: Normal range of motion SKIN: Skin is warm and dry. No rash noted. Not diaphoretic.No pallor. NEUROLGIC: Alert and oriented to person, place, and time. Normal coordination.   Labs and Imaging [place] Ob Transvaginal  Result Date: 02/18/2018 CLINICAL DATA:  42 year old female presents for follow-up of questionable lower uterine segment gestation on recent sonogram. Quantitative beta hCG less than 1 on 02/14/2018. EXAM: TRANSVAGINAL OB ULTRASOUND TECHNIQUE: Transvaginal ultrasound was performed for complete evaluation of the gestation as well as the maternal uterus, adnexal regions, and pelvic cul-de-sac. COMPARISON:  02/10/2018 obstetric scan. FINDINGS: Mildly enlarged retroverted uterus measures 9.8 x 4.8 x 5.6 cm. No convincing uterine fibroids demonstrated. Heterogeneous nodular 2.3 x 1.6 x 1.5  cm subendometrial myometrial focus in the right anterior uterine body with refractory shadowing and no added shadows, most compatible with focal adenomyosis/adenomyoma. Bilayer endometrial thickness 5 mm. No intrauterine gestational sac. A few scattered nabothian cysts are noted in the cervix. Previously questioned gestational sac in the lower uterine segment on the 02/10/2018 scan correlates with a nabothian cyst. No focal endometrial mass or endometrial cavity fluid. Left ovary measures 2.7 x 1.9 x 2.0 cm and contains a corpus luteum. Right ovary measures 2.1 x 1.2 x 1.6 cm. No suspicious ovarian or adnexal masses. Small amount of simple free fluid in cul-de-sac and left adnexa. IMPRESSION: 1. No intrauterine gestation. Thin endometrium (5 mm). No suspicious adnexal masses. Findings are compatible with spontaneous abortion of the gestational sac originally described on 12/08/2017 sonogram. 2. Suggestion of focal adenomyosis/adenomyoma in the right anterior uterine body. No convincing uterine fibroids. Electronically Signed   By: Delbert PhenixJason A Poff M.D.   On: 02/18/2018 12:01   [place] Ob Transvaginal  Result Date: 02/10/2018 CLINICAL DATA:  Missed abortion EXAM: TRANSVAGINAL OB ULTRASOUND TECHNIQUE: Transvaginal ultrasound was performed for complete evaluation of the gestation as well as the maternal uterus, adnexal regions, and pelvic cul-de-sac. COMPARISON:  None. FINDINGS: Intrauterine gestational sac: Single, in the lower uterine segment. Yolk sac:  Visualized Embryo:  Visualized Cardiac Activity: Not visualized Heart Rate:  bpm MSD:   mm    w     d CRL:   2.2 mm   5 w 5 d                  US EDC: 10/08/2018 Subchorionic hemorrhage:  None visualized. Maternal  uterus/adnexae: Uterus is retroverted. No adnexal mass or free fluid. IMPRESSION: Single gestational sac in the lower uterine segment. Estimated gestational age [redacted] weeks 5 days by crown-rump length. No fetal heart rate detected. This could be followed with repeat  ultrasound in 10-14 days. Electronically Signed   By: Charlett Nose M.D.   On: 02/10/2018 09:20   qHCG 19mo ago (12/11/17) 19mo ago (12/08/17)    hCG, Beta Chain, Sharene Butters, S <5 mIU/mL 4,399High   3,695   8/19, 8/21, 8/23 qHCG <1  Assessment & Plan:  Miscarriage- completed  I reviewed with pt her entire clinical course and showed her the images which confirmed her empty uterus and also reviewed her labs. She continued ot express frustration over her care.  I have offered to see her again in 2 days to review the course of her care given her level of angst.  She declines contraception management at this time.   Total face-to-face time with patient was 60 min.  Greater than 50% was spent in counseling and coordination of care with the patient.   Geri Hepler L. Harraway-Smith, M.D., [facility]

## 2018-02-19 ENCOUNTER — Encounter: Payer: Self-pay | Admitting: Obstetrics & Gynecology

## 2018-02-21 ENCOUNTER — Ambulatory Visit (HOSPITAL_COMMUNITY): Payer: Medicaid Other

## 2018-03-04 LAB — BETA HCG QUANT (REF LAB): hCG Quant: <1 m[IU]/mL

## 2018-03-04 LAB — SPECIMEN STATUS REPORT

## 2018-03-10 ENCOUNTER — Ambulatory Visit: Payer: Self-pay | Admitting: Obstetrics & Gynecology

## 2018-04-15 ENCOUNTER — Ambulatory Visit: Payer: Self-pay | Admitting: Internal Medicine

## 2018-09-29 ENCOUNTER — Telehealth: Payer: Self-pay | Admitting: Internal Medicine

## 2018-09-29 MED ORDER — ALBUTEROL SULFATE HFA 108 (90 BASE) MCG/ACT IN AERS: INHALATION_SPRAY | RESPIRATORY_TRACT | 1 refills | Status: DC

## 2018-09-29 MED ORDER — ALBUTEROL SULFATE (2.5 MG/3ML) 0.083% IN NEBU: 5.0000 mg | INHALATION_SOLUTION | Freq: Four times a day (QID) | RESPIRATORY_TRACT | 3 refills | Status: DC | PRN

## 2018-09-29 NOTE — Telephone Encounter (Signed)
Left detailed message Refill submitted per pt request. Also patient needs to schedule 6 mos f/u with MW Nothing further needed.

## 2018-10-22 ENCOUNTER — Other Ambulatory Visit: Payer: Self-pay

## 2018-10-22 ENCOUNTER — Encounter: Payer: Self-pay | Admitting: Internal Medicine

## 2018-10-22 ENCOUNTER — Ambulatory Visit (INDEPENDENT_AMBULATORY_CARE_PROVIDER_SITE_OTHER): Payer: Self-pay | Admitting: Internal Medicine

## 2018-10-22 VITALS — BP 120/80 | HR 97 | Temp 98.3°F | Ht 68.0 in | Wt 154.8 lb

## 2018-10-22 DIAGNOSIS — J4540 Moderate persistent asthma, uncomplicated: Secondary | ICD-10-CM

## 2018-10-22 MED ORDER — BUDESONIDE-FORMOTEROL FUMARATE 160-4.5 MCG/ACT IN AERO: 2.0000 | INHALATION_SPRAY | Freq: Two times a day (BID) | RESPIRATORY_TRACT | 0 refills | Status: DC

## 2018-10-22 MED ORDER — PREDNISONE 10 MG PO TABS
ORAL_TABLET | ORAL | 0 refills | Status: DC
Start: 2018-10-22 — End: 2018-11-05

## 2018-10-22 NOTE — Patient Instructions (Addendum)
Plan A = Automatic = symbicort 160 Take 2 puffs first thing in am and then another 2 puffs about 12 hours later.   Prednisone 10 mg take  4 each am x 2 days,   2 each am x 2 days,  1 each am x 2 days and stop    Work on inhaler technique:  relax and gently blow all the way out then take a nice smooth deep breath back in, triggering the inhaler at same time you start breathing in.  Hold for up to 5 seconds if you can. Blow out thru nose. Rinse and gargle with water when done       Plan B = Backup Only use your albuterol inhaler as a rescue medication to be used if you can't catch your breath by resting or doing a relaxed purse lip breathing pattern.  - The less you use it, the better it will work when you need it. - Ok to use the inhaler up to 2 puffs  every 4 hours if you must but call for appointment if use goes up over your usual need - Don't leave home without it !!  (think of it like the spare tire for your car)   Plan C = Crisis - only use your albuterol nebulizer if you first try Plan B and it fails to help > ok to use the nebulizer up to every 4 hours but if start needing it regularly call for immediate appointment    Please schedule a follow up office visit in 2Plan A = Automatic = change to symbicort 80 Take 2 puffs first thing in am and then another 2 puffs about 12 hours later.     Work on inhaler technique:  relax and gently blow all the way out then take a nice smooth deep breath back in, triggering the inhaler at same time you start breathing in.  Hold for up to 5 seconds if you can. Blow out thru nose. Rinse and gargle with water when done      Plan B = Backup Only use your albuterol as a rescue medication to be used if you can't catch your breath by resting or doing a relaxed purse lip breathing pattern.  - The less you use it, the better it will work when you need it. - Ok to use the inhaler up to 2 puffs  every 4 hours if you must but call for appointment if use goes  up over your usual need - Don't leave home without it !!  (think of it like the spare tire for your car)   Plan C = Crisis - only use your albuterol nebulizer if you first try Plan B and it fails to help > ok to use the nebulizer up to every 4 hours but if start needing it regularly call for immediate appointment     Please schedule a follow up office visit in 2 weeks, call sooner if needed with all medications /inhalers/ solutions in hand so we can verify exactly what you are taking. This includes all medications from all doctors and over the counters - PLEASE separate them into two bags:  the ones you take automatically, no matter what, vs the ones you take just when you feel you need them "BAG #2 is UP TO YOU"  - this will really help [place] help you take your medications more effectively.

## 2018-10-22 NOTE — Progress Notes (Signed)
@Patient  ID: Crystal Sampson, female    DOB: 10-Apr-1976 .   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  AP 03/20/2004-total IgE 1977.5 with broad elevation of almost all allergens and extremely high level against dog dander [name] 04/2012:  FEV1 1.72 (58%), ratio 68 No response to singulair.  - changed advair to [facility] 12/15/2014 > changed to Dulera 100 due to insurance restrictions 07/08/2015  Allergy Profile 07/17/2012-total IgE 1144.9 with elevation of all potential allergens on the panel Allergy Skin Test- 03/25/13- significant positive for grass weed and tree pollens dust mite, cat   07/17/2016 follow-up asthma. NP  Patient returns for a three-month follow-up. Patient says she is doing well with her asthma. She denies any flare of wheezing or cough. Patient does get Dulera through patient assistance program. She says that she will be changing her insurance soon and would like her patient assistance papers filled back out. She does complain of some nasal drip and stuffiness intermittently. She's been using some over-the-counter nasal sprays. Denies any fever or discolored mucus, chest pain, orthopnea, PND, or increased leg swelling. She denies any increased use of albuterol. rec Continue on Dulera 2 puffs Twice daily  , rinse after use.  Saline nasal rinses As needed   Begin Flonase 1 puff Twice daily     02/14/2017  f/u ov/Crystal Sampson re: asthma/ atopic  Chief Complaint  Patient presents with  . Follow-up    Breathing is overall doing well. She rarely uses albuterol inhaler or neb.    despite very poor and inconsistent hfa technique no daytime or noct symptoms or need for hfa rec Plan A = Automatic =  Dulera 100 Take 2 puffs first thing in am and then another 2 puffs about 12 hours later.  Work on inhaler technique:   Plan B = Backup  Only use your albuterol (ventolin)  Plan C = Crisis - only use your albuterol nebulizer if you first try Plan B and it fails to help > ok to use the nebulizer up to every 4 hours but if start needing it regularly call for immediate appointment   04/17/2017 acute extended ov/Crystal Sampson re: atopic asthma with cough flare Chief Complaint  Patient presents with  . Acute Visit    Increased cough and congestion x 2 wks. She states cough is waking her up in the night and she is coughing so hard she is vomiting. She is coughing up green sputum.  She is using her albuterol inhaler 3-4 x per wk, and has not needed neb.   fine 2 weeks prior to OV  On just on dulera 100 Take 2 puffs first thing in am and then another 2 puffs about 12 hours later.  Getting dulera directly from [employer] contact 2 week prior to OV  > sev days later developed  sore throat dry cough / rx mucinex  Worse at hs than daytime s nasal congestion / Mucus light yellow turning to green  No rescue in hand but never takes more than one a day and could not tell me what the written action plan has for saba use  rec For cough > mucinex dm 1200 mg every 12 hours and supplement with tramadol 50 mg every 4 hours as needed Work on inhaler technique:  Prednisone 10 mg take  4 each am x 2 days,   2 each am x 2 days,  1 each am x 2 days and stop  Doxycycline  100 mg twice twice daily x 10 days  If the cough continues >   You will need to add Try prilosec otc   Take 30-60 min before first meal of the day and Pepcid ac (famotidine) 20 mg one @  bedtime until cough is completely gone for at least a week without the need for cough suppression GERD diet  Keep your follow up appt to see me in November but call sooner if needed. > did not return as req    12/16/2017 acute extended ov/Crystal Sampson re: sob in pt with asthma on dulera 100 2bid  Chief Complaint  Patient presents with  . Acute Visit    Increased SOB due to stress x 2 wks. She states occ feels  some pressure in her chest. She is using her albuterol inhaler about every other day and she has used neb x 1 in the past 2 wks.   more chest tight/sob x 2 weeks no better either saba or neb  Waking up thru the night can't breath resolves s rx p few minutes s saba Mornings are not all that bad / uses dulera w/in 30 min of rising  Cough not an issue  Has medicaid due to 6 weeks IUP  - last 16 years prior to OV  Asthma poorly controlled during that time rec Plan A = Automatic = change to symbicort 80 Take 2 puffs first thing in am and then another 2 puffs about 12 hours later.  Work on inhaler technique:  Plan B = Backup Only use your albuterol as a rescue medication  Plan C = Crisis - only use your albuterol nebulizer if you first try Plan B  Please schedule a follow up office visit in 4 weeks, sooner if needed  with all medications /inhalers/ solutions in hand so we can verify exactly what you are taking. This includes all medications from all doctors and over the counters     01/13/2018  f/u ov/Crystal Sampson re: asthma in ? Ongoing IUP  Chief Complaint  Patient presents with  . Follow-up    Breathing is doing well. She has not needed her albuterol inhaler or neb.   Dyspnea:  Not limited by sob/ works as Child psychotherapist Cough: none Sleeping: fine SABA use:  none 02: none  Not sure she still has IUP as had period a few weeks prior to OV  But breathing has been fine despite poor hfa (see a/p)     10/22/2018  f/u ov/Crystal Sampson re:  ? 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 Acute Visit    Increased SOB over the past 2 months. She has been having panic attacks. She is out of her ventolin and is using her neb about once every other day.  Dyspnea:  Fine for months s any symbicort just rarely needing albuterol then indolent onset sob x 2 m prior to OV  And  then restarted symbicort 80 / has new dog in house though allergy screen pos for dog Cough: none Sleeping:feels needing saba nightly either hfa or neb / sleeping flat /2-3 pillows under hob  SABA use: as above does better p  noct otc allergy medication then wakes up and needs saba hfa or neb   02: none   No obvious day to day or daytime variability or assoc excess/ purulent sputum or mucus plugs or hemoptysis or cp or chest tightness, subjective wheeze or overt sinus or hb symptoms.     Also denies any obvious fluctuation of symptoms with weather or environmental changes or other aggravating or alleviating factors except as outlined above   No unusual exposure hx or h/o childhood pna knowledge of premature birth.  Current Allergies, Complete Past Medical History, Past Surgical History, Family History, and Social History were reviewed in [facility] electronic medical record.  ROS  The following are not active complaints unless bolded Hoarseness, sore throat, dysphagia, dental problems, itching since new dog in house, sneezing,  nasal congestion or discharge of excess mucus or purulent secretions, ear ache,   fever, chills, sweats, unintended wt loss or wt gain, classically pleuritic or exertional cp,  orthopnea pnd or arm/hand swelling  or leg swelling, presyncope, palpitations, abdominal pain, anorexia, nausea, vomiting, diarrhea  or change in bowel habits or change in bladder habits, change in stools or change in urine, dysuria, hematuria,  rash, arthralgias, visual complaints, headache, numbness, weakness or ataxia or problems with walking or coordination,  change in mood or  memory.        Current Meds  Medication Sig  . albuterol (PROVENTIL) (2.5 MG/3ML) 0.083% nebulizer solution Take 6 mLs (5 mg total) by nebulization every 6 (six) hours as needed for wheezing or shortness of breath. DX: J45.40  . budesonide-formoterol (SYMBICORT) 80-4.5 MCG/ACT inhaler Inhale 2 puffs into the lungs 2  (two) times daily.                       Physical Exam  amb anxious bf nad though has push of speech and jumps from one topic to another before fully answering questions by saying "I'm so worried, I just don't know anymore" but does not appear depressed on hopless/ just very poor insight  10/22/2018       154  01/13/2018       162  12/16/2017       166   04/17/2017    198   02/14/17 204 lb 12.8 oz (92.9 kg)  07/17/16 208 lb 3.2 oz (94.4 kg)  03/27/16 178 lb (80.7 kg)    Vital signs reviewed - Note on arrival 02 sats  99% on RA     HEENT: nl dentition, turbinates bilaterally, and oropharynx. Nl external ear canals without cough reflex   NECK :  without JVD/Nodes/TM/ nl carotid upstrokes bilaterally   LUNGS: no acc muscle use,  Nl contour chest with distant mid exp wheezed  bilaterally without cough on insp or exp maneuvers   CV:  RRR  no s3 or murmur or increase in P2, and no edema   ABD:  soft and nontender with nl inspiratory excursion in the supine position. No bruits or organomegaly appreciated, bowel sounds nl  MS:  Nl gait/ ext warm without deformities, calf tenderness, cyanosis or clubbing No obvious joint restrictions   SKIN: warm and dry without lesions    NEURO:  alert, approp, nl sensorium with  no motor or cerebellar deficits apparent.           Assessment & Plan:

## 2018-10-23 ENCOUNTER — Encounter: Payer: Self-pay | Admitting: Internal Medicine

## 2018-10-23 NOTE — Assessment & Plan Note (Signed)
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 Aspergillus CF <1:8 [name] 2001: FEV1 2.38 (68%), ratio 80. Acute exacerbation/hospitalization 03/2012 [name] 04/2012:  FEV1 1.72 (58%), ratio 68 No response to singulair.  - changed advair to [facility] 12/15/2014 > changed to [facility] 100 due to insurance restrictions 07/08/2015  - 04/17/2017  extensive coaching HFA effectiveness =    75%  From a baseline of 50% - Spirometry 12/16/2017  FEV1 2.14 (74%)  Ratio 79 p am dulera 100 x 2  - FENO 12/16/2017  =   7 p dulera 100 x 2  - 12/16/2017  After extensive coaching inhaler device  effectiveness =    75% and 6 weeks iup > change to symb  80 2bid    - spring 2020 new dog in house  - 10/22/2018  After extensive coaching inhaler device,  effectiveness =    75% > change to symbicort 160  2 bid and rx pred x 6 days    DDX of  difficult airways management almost all start with A and  include Adherence, Ace Inhibitors, Acid Reflux, Active Sinus Disease, Alpha 1 Antitripsin deficiency, Anxiety masquerading as Airways dz,  ABPA,  Allergy(esp in young), Aspiration (esp in elderly), Adverse effects of meds,  Active smoking or vaping, A bunch of PE's (a small clot burden can't cause this syndrome unless there is already severe underlying pulm or vascular dz with poor reserve) plus two Bs  = Bronchiectasis and Beta blocker use..and one C= CHF   Adherence is always the initial "prime suspect" and is a multilayered concern that requires a "trust but verify" approach in every patient - starting with knowing how to use medications, especially inhalers, correctly, keeping up with refills and understanding the fundamental difference between maintenance and prns vs those medications only taken for a very short course and then stopped and not refilled.  - see hfa teaching - samples until return with all meds in hand using a trust but verify approach to confirm accurate Medication  Reconciliation The principal here is  that until we are certain that the  patients are doing what we've asked, it makes no sense to ask them to do more.    ? Allergy > new dog worrisome/ very poor insight into environmental controls > Prednisone 10 mg take  4 each am x 2 days,   2 each am x 2 days,  1 each am x 2 days and stop and increase symb to 160 2bid   ? Anxiety > usually at the bottom of this list of usual suspects but should be much higher on this pt's based on H and P and   may interfere with adherence and also interpretation of response or lack thereof to symptom management which can be quite subjective.    Will first establish whether truly allergy flare or just flaring due to poor adherence and work on the latter > to keep things simple, I have asked the patient to first separate medicines that are perceived as maintenance, that is to be taken daily "no matter what", from those medicines that are taken on only on an as-needed basis and I have given the patient examples of both, and then return with the bags to sort through needs   I had an extended discussion with the patient reviewing all relevant studies completed to date and  lasting 12 minutes of a 15 minute visit    See device teaching which extended face to face time  for this visit.  Each maintenance medication was reviewed in detail including emphasizing most importantly the difference between maintenance and prns and under what circumstances the prns are to be triggered using an action plan format that is not reflected in the computer generated alphabetically organized AVS which I have not found useful in most complex patients, especially with respiratory illnesses  Please see AVS for specific instructions unique to this visit that I personally wrote and verbalized to the the pt in detail and then reviewed with pt  by my nurse highlighting any  changes in therapy recommended at today's visit to their plan of care.

## 2018-11-05 ENCOUNTER — Ambulatory Visit (INDEPENDENT_AMBULATORY_CARE_PROVIDER_SITE_OTHER): Payer: Self-pay | Admitting: Internal Medicine

## 2018-11-05 ENCOUNTER — Encounter: Payer: Self-pay | Admitting: Internal Medicine

## 2018-11-05 ENCOUNTER — Other Ambulatory Visit: Payer: Self-pay

## 2018-11-05 VITALS — BP 100/70 | HR 80 | Temp 99.0°F | Ht 68.0 in | Wt 150.8 lb

## 2018-11-05 DIAGNOSIS — J4540 Moderate persistent asthma, uncomplicated: Secondary | ICD-10-CM

## 2018-11-05 DIAGNOSIS — K219 Gastro-esophageal reflux disease without esophagitis: Secondary | ICD-10-CM

## 2018-11-05 MED ORDER — BUDESONIDE-FORMOTEROL FUMARATE 160-4.5 MCG/ACT IN AERO: 2.0000 | INHALATION_SPRAY | Freq: Two times a day (BID) | RESPIRATORY_TRACT | 0 refills | Status: DC

## 2018-11-05 NOTE — Progress Notes (Signed)
 @Patient  ID: Crystal Sampson, female    DOB: 08/25/1975 .   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  AP 03/20/2004-total IgE 1977.5 with broad elevation of almost all allergens and extremely high level against dog dander Cleda DaubSpiro 04/2012:  FEV1 1.72 (58%), ratio 68 No response to singulair.  - [facility] > changed to Dulera 100 due to insurance restrictions 07/08/2015  Allergy Profile 07/17/2012-total IgE 1144.9 with elevation of all potential allergens on the panel Allergy Skin Test- 03/25/13- significant positive for grass weed and tree pollens dust mite, cat   07/17/2016 follow-up asthma. NP  Patient returns for a three-month follow-up. Patient says she is doing well with her asthma. She denies any flare of wheezing or cough. Patient does get Dulera through patient assistance program. She says that she will be changing her insurance soon and would like her patient assistance papers filled back out. She does complain of some nasal drip and stuffiness intermittently. She's been using some over-the-counter nasal sprays. Denies any fever or discolored mucus, chest pain, orthopnea, PND, or increased leg swelling. She denies any increased use of albuterol. rec Continue on Dulera 2 puffs Twice daily  , rinse after use.  Saline nasal rinses As needed   Begin Flonase 1 puff Twice daily     02/14/2017  f/u ov/Crystal Sampson re: asthma/ atopic  Chief Complaint  Patient presents with   Follow-up    Breathing is overall doing well. She rarely uses albuterol inhaler or neb.    despite very poor and inconsistent hfa technique no daytime or noct symptoms or need for hfa rec Plan A = Automatic =  Dulera 100 Take 2 puffs first thing in am and then another 2 puffs about 12 hours later.  Work on inhaler technique:   Plan B =  Backup Only use your albuterol (ventolin)  Plan C = Crisis - only use your albuterol nebulizer if you first try Plan B and it fails to help > ok to use the nebulizer up to every 4 hours but if start needing it regularly call for immediate appointment   04/17/2017 acute extended ov/Crystal Sampson re: atopic asthma with cough flare Chief Complaint  Patient presents with   Acute Visit    Increased cough and congestion x 2 wks. She states cough is waking her up in the night and she is coughing so hard she is vomiting. She is coughing up green sputum.  She is using her albuterol inhaler 3-4 x per wk, and has not needed neb.   fine 2 weeks prior to OV  On just on dulera 100 Take 2 puffs first thing in am and then another 2 puffs about 12 hours later.  Getting dulera directly from [employer] contact 2 week prior to OV  > sev days later developed  sore throat dry cough / rx mucinex  Worse at hs than daytime s nasal congestion / Mucus light yellow turning to green  No rescue in hand but never takes more than one a day and could not tell me what the written action plan has for saba use  rec For cough > mucinex dm 1200 mg every 12 hours and supplement with tramadol 50 mg every 4 hours as needed Work on inhaler technique:  Prednisone 10 mg take  4 each am x 2 days,   2 each am x 2 days,  1 each am x 2 days and stop  Doxycycline  100 mg twice twice daily x 10 days  If the cough continues >   You will need to add Try prilosec otc   Take 30-60 min before first meal of the day and Pepcid ac (famotidine) 20 mg one @  bedtime until cough is completely gone for at least a week without the need for cough suppression GERD diet  Keep your follow up appt to see me in November but call sooner if needed. > did not return as req    12/16/2017 acute extended ov/Crystal Sampson re: sob in pt with asthma on dulera 100 2bid  Chief Complaint  Patient presents with   Acute Visit    Increased SOB due to stress x 2 wks. She states occ  feels some pressure in her chest. She is using her albuterol inhaler about every other day and she has used neb x 1 in the past 2 wks.   more chest tight/sob x 2 weeks no better either saba or neb  Waking up thru the night can't breath resolves s rx p few minutes s saba Mornings are not all that bad / uses dulera w/in 30 min of rising  Cough not an issue  Has medicaid due to 6 weeks IUP  - last 16 years prior to OV  Asthma poorly controlled during that time rec Plan A = Automatic = change to symbicort 80 Take 2 puffs first thing in am and then another 2 puffs about 12 hours later.  Work on inhaler technique:  Plan B = Backup Only use your albuterol as a rescue medication  Plan C = Crisis - only use your albuterol nebulizer if you first try Plan B  Please schedule a follow up office visit in 4 weeks, sooner if needed  with all medications /inhalers/ solutions in hand so we can verify exactly what you are taking. This includes all medications from all doctors and over the counters   10/22/2018  f/u ov/Crystal Sampson re:  ? 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 She has been having panic attacks. She is out of her ventolin and is using her neb about once every other day.  Dyspnea:  Fine for months s any symbicort just rarely needing albuterol then indolent onset sob x 2 m prior to OV  And then restarted symbicort 80 / has new dog in house though allergy screen pos for dog Cough: none Sleeping:feels needing saba nightly either hfa or neb / sleeping flat /2-3 pillows under hob  SABA use: as above does better p noct otc allergy medication then wakes up and needs saba hfa or neb   rec Plan A = Automatic = symbicort 160 Take 2 puffs first thing in am and then another 2 puffs about 12 hours later.   Prednisone 10 mg take  4 each am x 2 days,   2 each am x 2 days,  1 each am x 2 days and stop  Work on inhaler technique:   Plan B = Backup Only use your albuterol inhaler Plan C = Crisis - only  use your albuterol nebulizer if you first try Plan B and it fails to help > ok to use the nebulizer up to every 4 hours but if start needing it regularly call for immediate appointment Please schedule a follow up office visit in 2 weeks    11/05/2018  f/u ov/Crystal Sampson re: asthma resolved on symb 160 2bid and pred p 6 days  Chief Complaint  Patient presents with   Follow-up    Breathing has been better since the last visit. She has had some heartburn and belching.    Dyspnea:  Not limited by breathing from desired activities   Cough: no Sleeping: able to lie flat  SABA use: none needed  02: none New reflux x sev weeks, had it during pregnancy/ assoc lots of burping  tums no better    No obvious day to day or daytime variability or assoc excess/ purulent sputum or mucus plugs or hemoptysis or cp or chest tightness, subjective wheeze or overt sinus   symptoms.   Sleeping  without nocturnal  or early am exacerbation  of respiratory  c/o's or need for noct saba. Also denies any obvious fluctuation of symptoms with weather or environmental changes or other aggravating or alleviating factors except as outlined above   No unusual exposure hx or h/o childhood pna/ asthma or knowledge of premature birth.  Current Allergies, Complete Past Medical History, Past Surgical History, Family History, and Social History were reviewed in [employer] record.  ROS  The following are not active complaints unless bolded Hoarseness, sore throat, dysphagia, dental problems, itching, sneezing,  nasal congestion or discharge of excess mucus or purulent secretions, ear ache,   fever, chills, sweats, unintended wt loss or wt gain, classically pleuritic or exertional cp,  orthopnea pnd or arm/hand  swelling  or leg swelling, presyncope, palpitations, abdominal pain, anorexia, nausea, vomiting, diarrhea  or change in bowel habits or change in bladder habits, change in stools or change in urine, dysuria, hematuria,  rash, arthralgias, visual complaints, headache, numbness, weakness or ataxia or problems with walking or coordination,  change in mood or  memory.        Current Meds  Medication Sig   albuterol (PROAIR HFA) 108 (90 Base) MCG/ACT inhaler 2 puffs every 4 hours as needed only  if your can't catch your breath   albuterol (PROVENTIL) (2.5 MG/3ML) 0.083% nebulizer solution Take 6 mLs (5 mg total) by nebulization every 6 (six) hours as needed for wheezing or shortness of breath. DX: J45.40   budesonide-formoterol (SYMBICORT) 160-4.5 MCG/ACT inhaler Inhale 2 puffs into the lungs 2 (two) times daily.                         Physical Exam  amb bf nad  Vital signs reviewed - Note on arrival 02 sats  100% on RA     11/05/2018        150  10/22/2018       154  01/13/2018       162  12/16/2017       166   04/17/2017    198   02/14/17 204 lb 12.8 oz (92.9 kg)  07/17/16 208 lb 3.2 oz (94.4 kg)  03/27/16 178 lb (80.7 kg)      HEENT: nl dentition, turbinates bilaterally, and oropharynx. Nl external ear canals without cough reflex   NECK :  without JVD/Nodes/TM/ nl carotid upstrokes bilaterally   LUNGS: no acc muscle  use,  Nl contour chest which is clear to A and P bilaterally without cough on insp or exp maneuvers   CV:  RRR  no s3 or murmur or increase in P2, and no edema   ABD:  soft and nontender with nl inspiratory excursion in the supine position. No bruits or organomegaly appreciated, bowel sounds nl  MS:  Nl gait/ ext warm without deformities, calf tenderness, cyanosis or clubbing No obvious joint restrictions   SKIN: warm and dry without lesions    NEURO:  alert, approp, nl sensorium with  no motor or cerebellar deficits apparent.         Assessment  & Plan:

## 2018-11-05 NOTE — Patient Instructions (Addendum)
Ok to try prilosec otc  20  Mg  Take 30- 60 min before your first and last meals of the day x 2 weeks and call if not better for GI eval    GERD (REFLUX)  is an extremely common cause of respiratory symptoms just like yours , many times with no obvious heartburn at all.    It can be treated with medication, but also with lifestyle changes including elevation of the head of your bed (ideally with 6 -8inch blocks under the headboard of your bed),  Smoking cessation, avoidance of late meals, excessive alcohol, and avoid fatty foods, chocolate, peppermint, colas, red wine, and acidic juices such as orange juice.  NO MINT OR MENTHOL PRODUCTS SO NO COUGH DROPS  USE SUGARLESS CANDY INSTEAD (Jolley ranchers or Stover's or Life Savers) or even ice chips will also do - the key is to swallow to prevent all throat clearing. NO OIL BASED VITAMINS - use powdered substitutes.  Avoid fish oil when coughing.   Please schedule a follow up visit in 3 months but call sooner if needed

## 2018-11-09 ENCOUNTER — Encounter: Payer: Self-pay | Admitting: Internal Medicine

## 2018-11-09 DIAGNOSIS — K219 Gastro-esophageal reflux disease without esophagitis: Secondary | ICD-10-CM | POA: Insufficient documentation

## 2018-11-09 NOTE — Assessment & Plan Note (Signed)
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 Acute exacerbation/hospitalization 03/2012 Arlyce Harman 04/2012:  FEV1 1.72 (58%), ratio 68 No response to singulair.  - changed advair to [facility] 12/15/2014 > changed to [facility] 100 due to insurance restrictions 07/08/2015  - 04/17/2017  extensive coaching HFA effectiveness =    75%  From a baseline of 50% - Spirometry 12/16/2017  FEV1 2.14 (74%)  Ratio 79 p am dulera 100 x 2  - FENO 12/16/2017  =   7 p dulera 100 x 2  - 12/16/2017  After extensive coaching inhaler device  effectiveness =    75% and 6 weeks iup > change to symb  80 2bid   - spring 2020 new dog in house  - 10/22/2018  After extensive coaching inhaler device,  effectiveness =    75% > change to symbicort 160  2 bid and rx pred x 6 days  - 11/05/2018  After extensive coaching inhaler device,  effectiveness =    75% (ti too short)  Despite suboptimal hfa, All goals of chronic asthma control met including optimal function and elimination of symptoms with minimal need for rescue therapy.  Contingencies discussed in full including contacting this office immediately if not controlling the symptoms using the rule of two's.

## 2018-11-09 NOTE — Assessment & Plan Note (Signed)
Onset around 1st of May 2020   Says similar to pregnancy but sure she is not > rx otc ppi and gi f/u prn    Each maintenance medication was reviewed in detail including most importantly the difference between maintenance and as needed and under what circumstances the prns are to be used.  Please see AVS for specific  Instructions which are unique to this visit and I personally typed out  which were reviewed in detail in writing with the patient and a copy provided.

## 2018-12-12 ENCOUNTER — Other Ambulatory Visit: Payer: Self-pay

## 2018-12-12 MED ORDER — BUDESONIDE-FORMOTEROL FUMARATE 160-4.5 MCG/ACT IN AERO: 2.0000 | INHALATION_SPRAY | Freq: Two times a day (BID) | RESPIRATORY_TRACT | 11 refills | Status: DC

## 2019-01-02 ENCOUNTER — Ambulatory Visit: Payer: Self-pay | Admitting: Internal Medicine

## 2019-02-06 ENCOUNTER — Ambulatory Visit: Payer: Self-pay | Admitting: Internal Medicine

## 2019-02-09 ENCOUNTER — Ambulatory Visit (INDEPENDENT_AMBULATORY_CARE_PROVIDER_SITE_OTHER): Payer: Medicaid Other | Admitting: Internal Medicine

## 2019-02-09 ENCOUNTER — Other Ambulatory Visit: Payer: Self-pay

## 2019-02-09 ENCOUNTER — Encounter: Payer: Self-pay | Admitting: Internal Medicine

## 2019-02-09 DIAGNOSIS — J3089 Other allergic rhinitis: Secondary | ICD-10-CM | POA: Diagnosis not present

## 2019-02-09 DIAGNOSIS — J302 Other seasonal allergic rhinitis: Secondary | ICD-10-CM | POA: Diagnosis not present

## 2019-02-09 DIAGNOSIS — J4540 Moderate persistent asthma, uncomplicated: Secondary | ICD-10-CM | POA: Diagnosis not present

## 2019-02-09 DIAGNOSIS — K219 Gastro-esophageal reflux disease without esophagitis: Secondary | ICD-10-CM | POA: Diagnosis not present

## 2019-02-09 NOTE — Assessment & Plan Note (Signed)
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 Acute exacerbation/hospitalization 03/2012 Arlyce Harman 04/2012:  FEV1 1.72 (58%), ratio 68 No response to singulair.  - changed advair to [facility] 12/15/2014 > changed to [facility] 100 due to insurance restrictions 07/08/2015  - 04/17/2017  extensive coaching HFA effectiveness =    75%  From a baseline of 50% - Spirometry 12/16/2017  FEV1 2.14 (74%)  Ratio 79 p am dulera 100 x 2  - FENO 12/16/2017  =   7 p dulera 100 x 2  - 12/16/2017  After extensive coaching inhaler device  effectiveness =    75% and 6 weeks iup > change to symb  80 2bid   - spring 2020 new dog in house  - 10/22/2018   change to symbicort 160  2 bid and rx pred x 6 days   - 02/09/2019  After extensive coaching inhaler device,  effectiveness =    90%    I had an extended discussion with the patient reviewing all relevant studies completed to date and  lasting 15 to 20 minutes of a 25 minute visit    I performed detailed device teaching using a teach back method which extended face to face time for this visit (see above)  Each maintenance medication was reviewed in detail including emphasizing most importantly the difference between maintenance and prns and under what circumstances the prns are to be triggered using an action plan format that is not reflected in the computer generated alphabetically organized AVS which I have not found useful in most complex patients, especially with respiratory illnesses  Please see AVS for specific instructions unique to this visit that I personally wrote and verbalized to the the pt in detail and then reviewed with pt  by my nurse highlighting any  changes in therapy recommended at today's visit to their plan of care.      Marland Kitchen

## 2019-02-09 NOTE — Assessment & Plan Note (Signed)
Allergy Profile 07/17/2012-total IgE 1144.9 with elevation of all potential allergens on the panel Allergy Skin Test- 03/25/13- significant positive for grass weed and tree pollens dust mite, cat Started allergy vaccine 03/2013> stopped around 2017   No further rx needed, f/u allergy prn

## 2019-02-09 NOTE — Assessment & Plan Note (Signed)
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 Acute exacerbation/hospitalization 03/2012 Arlyce Harman 04/2012:  FEV1 1.72 (58%), ratio 68 No response to singulair.  - changed advair to [facility] 12/15/2014 > changed to [facility] 100 due to insurance restrictions 07/08/2015  - 04/17/2017  extensive coaching HFA effectiveness =    75%  From a baseline of 50% - Spirometry 12/16/2017  FEV1 2.14 (74%)  Ratio 79 p am dulera 100 x 2  - FENO 12/16/2017  =   7 p dulera 100 x 2  - 12/16/2017  After extensive coaching inhaler device  effectiveness =    75% and 6 weeks iup > change to symb  80 2bid   - spring 2020 new dog in house  - 10/22/2018   change to symbicort 160  2 bid and rx pred x 6 days  - 11/05/2018  After extensive coaching inhaler device,  effectiveness =    75% (ti too short)  Reviewed diet again and also criteria for gi eval/ endoscopy - see avs for instructions unique to this ov

## 2019-02-09 NOTE — Patient Instructions (Signed)
GERD (REFLUX)  is an extremely common cause of respiratory symptoms just like yours , many times with no obvious heartburn at all.    It can be treated with medication, but also with lifestyle changes including elevation of the head of your bed (ideally with 6 -8inch blocks under the headboard of your bed),  Smoking cessation, avoidance of late meals, excessive alcohol, and avoid fatty foods, chocolate, peppermint, colas, red wine, and acidic juices such as orange juice.  NO MINT OR MENTHOL PRODUCTS SO NO COUGH DROPS  USE SUGARLESS CANDY INSTEAD (Jolley ranchers or Stover's or Life Savers) or even ice chips will also do - the key is to swallow to prevent all throat clearing. NO OIL BASED VITAMINS - use powdered substitutes.  Avoid fish oil when coughing.  Ok to use prilosec for up 2 week but beyond that let if needed let me refer you to GI     No change in respiratory medications   Please schedule a follow up visit in 6 months but call sooner if needed

## 2019-02-09 NOTE — Progress Notes (Signed)
 @Patient  ID: Crystal Sampson, female    DOB: 12/20/1975 .   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  AP 03/20/2004-total IgE 1977.5 with broad elevation of almost all allergens and extremely high level against dog dander Cleda DaubSpiro 04/2012:  FEV1 1.72 (58%), ratio 68 No response to singulair.  - [facility] 12/15/2014 > changed to Dulera 100 due to insurance restrictions 07/08/2015  Allergy Profile 07/17/2012-total IgE 1144.9 with elevation of all potential allergens on the panel Allergy Skin Test- 03/25/13- significant positive for grass weed and tree pollens dust mite, cat   07/17/2016 follow-up asthma. NP  Patient returns for a three-month follow-up. Patient says she is doing well with her asthma. She denies any flare of wheezing or cough. Patient does get Dulera through patient assistance program. She says that she will be changing her insurance soon and would like her patient assistance papers filled back out. She does complain of some nasal drip and stuffiness intermittently. She's been using some over-the-counter nasal sprays. Denies any fever or discolored mucus, chest pain, orthopnea, PND, or increased leg swelling. She denies any increased use of albuterol. rec Continue on Dulera 2 puffs Twice daily  , rinse after use.  Saline nasal rinses As needed   Begin Flonase 1 puff Twice daily     02/14/2017  f/u ov/ re: asthma/ atopic  Chief Complaint  Patient presents with  . Follow-up    Breathing is overall doing well. She rarely uses albuterol inhaler or neb.    despite very poor and inconsistent hfa technique no daytime or noct symptoms or need for hfa rec Plan A = Automatic =  Dulera 100 Take 2 puffs first thing in am and then another 2 puffs about 12 hours later.  Work on inhaler technique:   Plan B =  Backup Only use your albuterol (ventolin)  Plan C = Crisis - only use your albuterol nebulizer if you first try Plan B and it fails to help > ok to use the nebulizer up to every 4 hours but if start needing it regularly call for immediate appointment   04/17/2017 acute extended ov/ re: atopic asthma with cough flare Chief Complaint  Patient presents with  . Acute Visit    Increased cough and congestion x 2 wks. She states cough is waking her up in the night and she is coughing so hard she is vomiting. She is coughing up green sputum.  She is using her albuterol inhaler 3-4 x per wk, and has not needed neb.   fine 2 weeks prior to OV  On just on dulera 100 Take 2 puffs first thing in am and then another 2 puffs about 12 hours later.  Getting dulera directly from [employer] [facility] contact 2 week prior to OV  > sev days later developed  sore throat dry cough / rx mucinex  Worse at hs than daytime s nasal congestion / Mucus light yellow turning to green  No rescue in hand but never takes more than one a day and could not tell me what the written action plan has for saba use  rec For cough > mucinex dm 1200 mg every 12 hours and supplement with tramadol 50 mg every 4 hours as needed Work on  inhaler technique:  Prednisone 10 mg take  4 each am x 2 days,   2 each am x 2 days,  1 each am x 2 days and stop  Doxycycline  100 mg twice twice daily x 10 days  If the cough continues >   You will need to add Try prilosec otc 20mg   Take 30-60 min before first meal of the day and Pepcid ac (famotidine) 20 mg one @  bedtime until cough is completely gone for at least a week without the need for cough suppression GERD diet  Keep your follow up appt to see me in November but call sooner if needed. > did not return as req    12/16/2017 acute extended ov/ re: sob in pt with asthma on dulera 100 2bid  Chief Complaint  Patient presents with  . Acute Visit    Increased SOB due to stress x 2 wks. She states occ  feels some pressure in her chest. She is using her albuterol inhaler about every other day and she has used neb x 1 in the past 2 wks.   more chest tight/sob x 2 weeks no better either saba or neb  Waking up thru the night can't breath resolves s rx p few minutes s saba Mornings are not all that bad / uses dulera w/in 30 min of rising  Cough not an issue  Has medicaid due to 6 weeks IUP  - last 16 years prior to OV  Asthma poorly controlled during that time rec Plan A = Automatic = change to symbicort 80 Take 2 puffs first thing in am and then another 2 puffs about 12 hours later.  Work on inhaler technique:  Plan B = Backup Only use your albuterol as a rescue medication  Plan C = Crisis - only use your albuterol nebulizer if you first try Plan B  Please schedule a follow up office visit in 4 weeks, sooner if needed  with all medications /inhalers/ solutions in hand so we can verify exactly what you are taking. This includes all medications from all doctors and over the counters   10/22/2018  f/u ov/ re:  ? 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 Acute Visit    Increased SOB over the past 2 months. She has been having panic attacks. She is out of her ventolin and is using her neb about once every other day.  Dyspnea:  Fine for months s any symbicort just rarely needing albuterol then indolent onset sob x 2 m prior to OV  And then restarted symbicort 80 / has new dog in house though allergy screen pos for dog Cough: none Sleeping:feels needing saba nightly either hfa or neb / sleeping flat /2-3 pillows under hob  SABA use: as above does better p noct otc allergy medication then wakes up and needs saba hfa or neb   rec Plan A = Automatic = symbicort 160 Take 2 puffs first thing in am and then another 2 puffs about 12 hours later.   Prednisone 10 mg take  4 each am x 2 days,   2 each am x 2 days,  1 each am x 2 days and stop  Work on inhaler technique:   Plan B = Backup Only use your albuterol inhaler Plan C =  Crisis - only use your albuterol nebulizer if you first try Plan B and it fails to help > ok to use the nebulizer up to every 4 hours but if start needing it regularly call for immediate appointment Please schedule a follow up office visit in 2 weeks    11/05/2018  f/u ov/ re: asthma resolved on symb 160 2bid and pred p 6 days  Chief Complaint  Patient presents with  . Follow-up    Breathing has been better since the last visit. She has had some heartburn and belching.    Dyspnea:  Not limited by breathing from desired activities   Cough: no Sleeping: able to lie flat  SABA use: none needed  02: none New reflux x sev weeks, had it during pregnancy/ assoc lots of burping  tums no better  rec Ok to try prilosec otc  20  Mg  Take 30- 60 min before your first and last meals of the day x 2 weeks and call if not better for GI eval  GERD diet Please schedule a follow up visit in 3 months but call sooner if needed    02/09/2019  f/u ov/ re:  Asthma maint on symb 160 2bid / no longer on gerd rx  Chief Complaint  Patient presents with  . Follow-up    Patient states that her breathing is doing alot better.   Dyspnea:  Outdoor biking including up some hills x 45 min qd  Cough: none  Sleeping: no resp symptoms SABA use: rarely  02: no   No obvious day to day or daytime variability or assoc excess/ purulent sputum or mucus plugs or hemoptysis or cp or chest tightness, subjective wheeze or overt sinus or hb symptoms.   Sleeping  without nocturnal  or early am exacerbation  of respiratory  c/o's or need for noct saba. Also denies any obvious fluctuation of symptoms with weather or environmental changes or other aggravating or alleviating factors except as outlined above   No unusual exposure hx or h/o  childhood pna  or knowledge of premature birth.  Current Allergies, Complete Past Medical History, Past Surgical History, Family History, and Social History were reviewed in [employer] record.  ROS  The following are not active complaints unless bolded Hoarseness, sore throat, dysphagia, dental problems, itching, sneezing,  nasal congestion or discharge of excess mucus or purulent secretions, ear ache,   fever, chills, sweats, unintended wt loss or wt gain, classically pleuritic or exertional cp,  orthopnea pnd or arm/hand swelling  or leg swelling, presyncope, palpitations, abdominal pain, anorexia, nausea, vomiting, diarrhea  or change in bowel habits or change in bladder habits, change in stools or change in urine, dysuria, hematuria,  rash, arthralgias, visual complaints, headache, numbness, weakness or ataxia or problems with walking or coordination,  change in mood or  memory.        Current Meds  Medication Sig  . albuterol (PROAIR HFA) 108 (90 Base) MCG/ACT inhaler 2 puffs every 4 hours as needed only  if your can't catch your breath  . albuterol (PROVENTIL) (2.5 MG/3ML) 0.083% nebulizer solution Take 6 mLs (5 mg total) by nebulization every 6 (six) hours as needed for wheezing or shortness of breath. DX: J45.40  . budesonide-formoterol (SYMBICORT) 160-4.5 MCG/ACT inhaler Inhale 2 puffs into the lungs 2 (two) times daily.               Physical Exam   Vital signs reviewed - Note on  arrival 02 sats  100% on RA         02/09/2019       155  11/05/2018       150  10/22/2018       154  01/13/2018       162  12/16/2017       166   04/17/2017    198   02/14/17 204 lb 12.8 oz (92.9 kg)  07/17/16 208 lb 3.2 oz (94.4 kg)  03/27/16 178 lb (80.7 kg)       HEENT: nl dentition, turbinates bilaterally, and oropharynx. Nl external ear canals without cough reflex   NECK :  without JVD/Nodes/TM/ nl carotid upstrokes bilaterally   LUNGS: no acc muscle use,  Nl  contour chest which is clear to A and P bilaterally without cough on insp or exp maneuvers   CV:  RRR  no s3 or murmur or increase in P2, and no edema   ABD:  soft and nontender with nl inspiratory excursion in the supine position. No bruits or organomegaly appreciated, bowel sounds nl  MS:  Nl gait/ ext warm without deformities, calf tenderness, cyanosis or clubbing No obvious joint restrictions   SKIN: warm and dry without lesions    NEURO:  alert, approp, nl sensorium with  no motor or cerebellar deficits apparent.          Assessment & Plan:

## 2019-03-11 ENCOUNTER — Other Ambulatory Visit: Payer: Self-pay

## 2019-03-11 ENCOUNTER — Ambulatory Visit: Payer: Medicaid Other | Admitting: Internal Medicine

## 2019-03-11 ENCOUNTER — Encounter: Payer: Self-pay | Admitting: Internal Medicine

## 2019-03-11 DIAGNOSIS — J4540 Moderate persistent asthma, uncomplicated: Secondary | ICD-10-CM

## 2019-03-11 NOTE — Progress Notes (Signed)
@Patient  ID: Crystal Sampson, female    DOB: Feb 09, 1976 .   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  AP 03/20/2004-total IgE 1977.5 with broad elevation of almost all allergens and extremely high level against dog dander [name] 04/2012:  FEV1 1.72 (58%), ratio 68 No response to singulair.  - changed advair to [facility] 12/15/2014 > changed to Dulera 100 due to insurance restrictions 07/08/2015  Allergy Profile 07/17/2012-total IgE 1144.9 with elevation of all potential allergens on the panel Allergy Skin Test- 03/25/13- significant positive for grass weed and tree pollens dust mite, cat   07/17/2016 follow-up asthma. NP  Patient returns for a three-month follow-up. Patient says she is doing well with her asthma. She denies any flare of wheezing or cough. Patient does get Dulera through patient assistance program. She says that she will be changing her insurance soon and would like her patient assistance papers filled back out. She does complain of some nasal drip and stuffiness intermittently. She's been using some over-the-counter nasal sprays. Denies any fever or discolored mucus, chest pain, orthopnea, PND, or increased leg swelling. She denies any increased use of albuterol. rec Continue on Dulera 2 puffs Twice daily  , rinse after use.  Saline nasal rinses As needed   Begin Flonase 1 puff Twice daily     02/14/2017  f/u ov/Jheri Mitter re: asthma/ atopic  Chief Complaint  Patient presents with  . Follow-up    Breathing is overall doing well. She rarely uses albuterol inhaler or neb.    despite very poor and inconsistent hfa technique no daytime or noct symptoms or need for hfa rec Plan A = Automatic =  Dulera 100 Take 2 puffs first thing in am and then another 2 puffs about 12 hours later.  Work on inhaler technique:   Plan B =  Backup Only use your albuterol (ventolin)  Plan C = Crisis - only use your albuterol nebulizer if you first try Plan B and it fails to help > ok to use the nebulizer up to every 4 hours but if start needing it regularly call for immediate appointment   04/17/2017 acute extended ov/Alea Ryer re: atopic asthma with cough flare Chief Complaint  Patient presents with  . Acute Visit    Increased cough and congestion x 2 wks. She states cough is waking her up in the night and she is coughing so hard she is vomiting. She is coughing up green sputum.  She is using her albuterol inhaler 3-4 x per wk, and has not needed neb.   fine 2 weeks prior to OV  On just on dulera 100 Take 2 puffs first thing in am and then another 2 puffs about 12 hours later.  Getting dulera directly from [employer] contact 2 week prior to OV  > sev days later developed  sore throat dry cough / rx mucinex  Worse at hs than daytime s nasal congestion / Mucus light yellow turning to green  No rescue in hand but never takes more than one a day and could not tell me what the written action plan has for saba use  rec For cough > mucinex dm 1200 mg every 12 hours and supplement with tramadol 50 mg every 4 hours as needed Work  on inhaler technique:  Prednisone 10 mg take  4 each am x 2 days,   2 each am x 2 days,  1 each am x 2 days and stop  Doxycycline  100 mg twice twice daily x 10 days  If the cough continues >   You will need to add Try prilosec otc 20mg   Take 30-60 min before first meal of the day and Pepcid ac (famotidine) 20 mg one @  bedtime until cough is completely gone for at least a week without the need for cough suppression GERD diet  Keep your follow up appt to see me in November but call sooner if needed. > did not return as req    12/16/2017 acute extended ov/Vernice Bowker re: sob in pt with asthma on dulera 100 2bid  Chief Complaint  Patient presents with  . Acute Visit    Increased SOB due to stress x 2 wks. She states occ  feels some pressure in her chest. She is using her albuterol inhaler about every other day and she has used neb x 1 in the past 2 wks.   more chest tight/sob x 2 weeks no better either saba or neb  Waking up thru the night can't breath resolves s rx p few minutes s saba Mornings are not all that bad / uses dulera w/in 30 min of rising  Cough not an issue  Has medicaid due to 6 weeks IUP  - last 16 years prior to OV  Asthma poorly controlled during that time rec Plan A = Automatic = change to symbicort 80 Take 2 puffs first thing in am and then another 2 puffs about 12 hours later.  Work on inhaler technique:  Plan B = Backup Only use your albuterol as a rescue medication  Plan C = Crisis - only use your albuterol nebulizer if you first try Plan B  Please schedule a follow up office visit in 4 weeks, sooner if needed  with all medications /inhalers/ solutions in hand so we can verify exactly what you are taking. This includes all medications from all doctors and over the counters   10/22/2018  f/u ov/Sanjiv Castorena re:  ? 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 Acute Visit    Increased SOB over the past 2 months. She has been having panic attacks. She is out of her ventolin and is using her neb about once every other day.  Dyspnea:  Fine for months s any symbicort just rarely needing albuterol then indolent onset sob x 2 m prior to OV  And then restarted symbicort 80 / has new dog in house though allergy screen pos for dog Cough: none Sleeping:feels needing saba nightly either hfa or neb / sleeping flat /2-3 pillows under hob  SABA use: as above does better p noct otc allergy medication then wakes up and needs saba hfa or neb   rec Plan A = Automatic = symbicort 160 Take 2 puffs first thing in am and then another 2 puffs about 12 hours later.   Prednisone 10 mg take  4 each am x 2 days,   2 each am x 2 days,  1 each am x 2 days and stop  Work on inhaler technique:   Plan B = Backup Only use your albuterol inhaler Plan C =  Crisis - only use your albuterol nebulizer if you first try Plan B and it fails to help > ok to use the nebulizer up to every 4 hours but if start needing it regularly call for immediate appointment Please schedule a follow up office visit in 2 weeks    11/05/2018  f/u ov/Rameen Quinney re: asthma resolved on symb 160 2bid and pred p 6 days  Chief Complaint  Patient presents with  . Follow-up    Breathing has been better since the last visit. She has had some heartburn and belching.    Dyspnea:  Not limited by breathing from desired activities   Cough: no Sleeping: able to lie flat  SABA use: none needed  02: none New reflux x sev weeks, had it during pregnancy/ assoc lots of burping  tums no better  rec Ok to try prilosec otc  20  Mg  Take 30- 60 min before your first and last meals of the day x 2 weeks and call if not better for GI eval  GERD diet Please schedule a follow up visit in 3 months but call sooner if needed    02/09/2019  f/u ov/Vipul Cafarelli re:  Asthma maint on symb 160 2bid / no longer on gerd rx  Chief Complaint  Patient presents with  . Follow-up    Patient states that her breathing is doing alot better.   Dyspnea:  Outdoor biking including up some hills x 45 min qd  Cough: none  Sleeping: no resp symptoms SABA use: rarely  rec GERD rx   Ok to use prilosec for up 2 week but beyond that let if needed let me refer you to GI   No change in respiratory medications   03/11/2019  f/u ov/Jaslyn Bansal re: atopic asthma maint symb 160 2bid / no ppi  Chief Complaint  Patient presents with  . Follow-up    [place] had asthma flare 03/08/19- feeling better now.   TO ER 03/08/19 x 3 h rx p cat exp rx neb/po steroids > back to baseline Dyspnea:  Outdoor biking ok up to 45min Cough: none  Sleeping: freq  awakening not due to cough /wheeze SABA use: none  - note did not use correctly during asthma flare and hfa suboptimal  02: none   No obvious day to day or daytime variability or assoc excess/ purulent sputum or mucus plugs or hemoptysis or cp or chest tightness, subjective wheeze or overt sinus or hb symptoms.   Sleeping now  without nocturnal  or early am exacerbation  of respiratory  c/o's or need for noct saba. Also denies any obvious fluctuation of symptoms with weather or environmental changes or other aggravating or alleviating factors except as outlined above   No unusual exposure hx or h/o childhood pna  or knowledge of premature birth.  Current Allergies, Complete Past Medical History, Past Surgical History, Family History, and Social History were reviewed in [facility] electronic medical record.  ROS  The following are not active complaints unless bolded Hoarseness, sore throat, dysphagia, dental problems, itching, sneezing,  nasal congestion or discharge of excess mucus or purulent secretions, ear ache,   fever, chills, sweats, unintended wt loss or wt gain, classically pleuritic or exertional cp,  orthopnea pnd or arm/hand swelling  or leg swelling, presyncope, palpitations, abdominal pain, anorexia, nausea, vomiting, diarrhea  or change in bowel habits or change in bladder habits, change in stools or change in urine, dysuria, hematuria,  rash, arthralgias, visual  complaints, headache, numbness, weakness or ataxia or problems with walking or coordination,  change in mood or  memory.        Current Meds  Medication Sig  . albuterol (PROAIR HFA) 108 (90 Base) MCG/ACT inhaler 2 puffs every 4 hours as needed only  if your can't catch your breath  . albuterol (PROVENTIL) (2.5 MG/3ML) 0.083% nebulizer solution Take 6 mLs (5 mg total) by nebulization every 6 (six) hours as needed for wheezing or shortness of breath. DX: J45.40  . budesonide-formoterol (SYMBICORT) 160-4.5 MCG/ACT  inhaler Inhale 2 puffs into the lungs 2 (two) times daily.                    Physical Exam        03/11/2019       166  02/09/2019       155  11/05/2018       150  10/22/2018       154  01/13/2018       162  12/16/2017       166   04/17/2017    198   02/14/17 204 lb 12.8 oz (92.9 kg)  07/17/16 208 lb 3.2 oz (94.4 kg)  03/27/16 178 lb (80.7 kg)     amb pleasant bf nad   Vital signs reviewed - Note on arrival 02 sats  99% on RA    HEENT : pt wearing mask not removed for exam due to covid -19 concerns.    NECK :  without JVD/Nodes/TM/ nl carotid upstrokes bilaterally   LUNGS: no acc muscle use,  Nl contour chest which is clear to A and P bilaterally without cough on insp or exp maneuvers   CV:  RRR  no s3 or murmur or increase in P2, and no edema   ABD:  soft and nontender with nl inspiratory excursion in the supine position. No bruits or organomegaly appreciated, bowel sounds nl  MS:  Nl gait/ ext warm without deformities, calf tenderness, cyanosis or clubbing No obvious joint restrictions   SKIN: warm and dry without lesions    NEURO:  alert, approp, nl sensorium with  no motor or cerebellar deficits apparent.            Assessment & Plan:

## 2019-03-11 NOTE — Patient Instructions (Signed)
Plan A = Automatic = symbicort 160 Take 2 puffs first thing in am and then another 2 puffs about 12 hours later.    Work on inhaler technique:  relax and gently blow all the way out then take a nice smooth deep breath back in, triggering the inhaler at same time you start breathing in.  Hold for up to 5 seconds if you can. Blow out thru nose. Rinse and gargle with water when done   Plan B = Backup Only use your albuterol (ventolin) inhaler as a rescue medication to be used if you can't catch your breath by resting or doing a relaxed purse lip breathing pattern.  - The less you use it, the better it will work when you need it. - Ok to use the inhaler up to 2 puffs  every 4 hours if you must but call for appointment if use goes up over your usual need - Don't leave home without it !!  (think of it like the spare tire for your car)   Plan C = Crisis - only use your albuterol nebulizer if you first try Plan B and it fails to help > ok to use the nebulizer up to every 4 hours but if start needing it regularly call for immediate appointment   Please schedule a follow up visit in 3 months but call sooner if needed  with all medications /inhalers/ solutions in hand so we can verify exactly what you are taking. This includes all medications from all doctors and over the counters

## 2019-03-12 ENCOUNTER — Encounter: Payer: Self-pay | Admitting: Internal Medicine

## 2019-03-12 NOTE — Assessment & Plan Note (Signed)
Onset childhood Spiro 2001: FEV1 2.38 (68%), ratio 80. AP 03/20/2004  IgE 1877, RAST very abnormal 2007. Aspergillus CF <1:8 Arlyce Harman 04/2012:  FEV1 1.72 (58%), ratio 68 No response to singulair.  Allergy Skin Test- 03/25/13- significant positive for grass weed and tree pollens dust mite, cat - changed advair to BREO 12/15/2014 > changed to [facility] 100 due to insurance restrictions 07/08/2015  - 04/17/2017  extensive coaching HFA effectiveness =    75%  From a baseline of 50% - Spirometry 12/16/2017  FEV1 2.14 (74%)  Ratio 79 p am dulera 100 x 2  - FENO 12/16/2017  =   7 p dulera 100 x 2  - 12/16/2017  After extensive coaching inhaler device  effectiveness =    75% and 6 weeks iup > change to symb  80 2bid   - spring 2020 new dog in house  - 10/22/2018   change to symbicort 160  2 bid and rx pred x 6 days  - 03/11/2019  After extensive coaching inhaler device,  effectiveness =  75%    (short ti)   Despite suboptima hfa and clear evidence of ongoing atopy with er trip p exposure to cat,  Presently All goals of chronic asthma control met including optimal function and elimination of symptoms with minimal need for rescue therapy.  Contingencies discussed in full including contacting this office immediately if not controlling the symptoms using the rule of two's.      I had an extended discussion with the patient reviewing all relevant studies completed to date and  lasting 15 to 20 minutes of a 25 minute visit    I performed detailed device teaching using a teach back method which extended face to face time for this visit (see above)  Each maintenance medication was reviewed in detail including emphasizing most importantly the difference between maintenance and prns and under what circumstances the prns are to be triggered using an action plan format that is not reflected in the computer generated alphabetically organized AVS which I have not found useful in most complex patients, especially with  respiratory illnesses  Please see AVS for specific instructions unique to this visit that I personally wrote and verbalized to the the pt in detail and then reviewed with pt  by my nurse highlighting any  changes in therapy recommended at today's visit to their plan of care.

## 2019-05-04 ENCOUNTER — Telehealth (INDEPENDENT_AMBULATORY_CARE_PROVIDER_SITE_OTHER): Payer: Medicaid Other | Admitting: Obstetrics and Gynecology

## 2019-05-04 DIAGNOSIS — N898 Other specified noninflammatory disorders of vagina: Secondary | ICD-10-CM

## 2019-05-04 NOTE — Telephone Encounter (Signed)
Pt stated she has an uncomfortable feeling in vaginal area. She stated it has been going on for two weeks. She also stated she has discharge and an odor. It started after her period. She would like to know if she should use a Monistat. Would like a call back.

## 2019-05-06 NOTE — Telephone Encounter (Signed)
Called pt to follow up from previous phone call.   Pt states she has had some vaginal discharge. Pt reports the discharge is clear white and is different for her. She reports she just not feel fresh. No itching or burning. She reports there is an odor to it, not a fishy smell.   Advised pt to come in for self swab to determine if infection is present. Message to front office to call and schedule pt for nurse visit. Pt was also informed of after hours clinic on Mondays from 4:30-7:30.

## 2019-05-13 ENCOUNTER — Ambulatory Visit: Payer: Medicaid Other

## 2019-05-20 ENCOUNTER — Other Ambulatory Visit: Payer: Self-pay

## 2019-05-20 ENCOUNTER — Other Ambulatory Visit (HOSPITAL_COMMUNITY)
Admission: RE | Admit: 2019-05-20 | Discharge: 2019-05-20 | Disposition: A | Discharge status: Home / Self Care | Payer: Medicaid Other | Source: Ambulatory Visit | Attending: Obstetrics | Admitting: Obstetrics

## 2019-05-20 ENCOUNTER — Ambulatory Visit: Payer: Medicaid Other

## 2019-05-20 DIAGNOSIS — N898 Other specified noninflammatory disorders of vagina: Secondary | ICD-10-CM | POA: Insufficient documentation

## 2019-05-20 NOTE — Progress Notes (Signed)
Pt is in the office for self swab. Pt reports vaginal discharge and odor, requests std testing.

## 2019-05-20 NOTE — Progress Notes (Signed)
Patient seen and assessed by nursing staff during this encounter. I have reviewed the chart and agree with the documentation and plan.  Mora Bellman, MD 05/20/2019 2:48 PM

## 2019-05-22 LAB — CERVICOVAGINAL ANCILLARY ONLY
Bacterial Vaginitis (gardnerella): POSITIVE — AB
Candida Glabrata: NEGATIVE
Candida Vaginitis: NEGATIVE
Chlamydia: NEGATIVE
Comment: NEGATIVE
Comment: NEGATIVE
Comment: NEGATIVE
Comment: NEGATIVE
Comment: NEGATIVE
Comment: NORMAL
Neisseria Gonorrhea: NEGATIVE
Trichomonas: NEGATIVE

## 2019-05-23 ENCOUNTER — Other Ambulatory Visit: Payer: Self-pay | Admitting: Obstetrics and Gynecology

## 2019-05-23 MED ORDER — METRONIDAZOLE 500 MG PO TABS: 500.0000 mg | ORAL_TABLET | Freq: Two times a day (BID) | ORAL | 0 refills | Status: DC

## 2019-05-26 ENCOUNTER — Ambulatory Visit: Payer: Medicaid Other | Admitting: Advanced Practice Midwife

## 2019-06-10 ENCOUNTER — Ambulatory Visit: Payer: Medicaid Other | Admitting: Internal Medicine

## 2019-06-12 ENCOUNTER — Other Ambulatory Visit: Payer: Self-pay

## 2019-06-12 ENCOUNTER — Encounter: Payer: Self-pay | Admitting: Internal Medicine

## 2019-06-12 ENCOUNTER — Ambulatory Visit: Payer: Medicaid Other | Admitting: Internal Medicine

## 2019-06-12 DIAGNOSIS — J4540 Moderate persistent asthma, uncomplicated: Secondary | ICD-10-CM

## 2019-06-12 MED ORDER — ALBUTEROL SULFATE HFA 108 (90 BASE) MCG/ACT IN AERS: INHALATION_SPRAY | RESPIRATORY_TRACT | 1 refills | Status: DC

## 2019-06-12 MED ORDER — PREDNISONE 10 MG PO TABS: ORAL_TABLET | ORAL | 0 refills | Status: DC

## 2019-06-12 NOTE — Assessment & Plan Note (Signed)
Onset childhood Spiro 2001: FEV1 2.38 (68%), ratio 80. AP 03/20/2004  IgE 1877, RAST very abnormal 2007. Aspergillus CF <1:8 Arlyce Harman 04/2012:  FEV1 1.72 (58%), ratio 68 No response to singulair.  Allergy Skin Test- 03/25/13- significant positive for grass weed and tree pollens dust mite, cat - changed advair to BREO 12/15/2014 > changed to [facility] 100 due to insurance restrictions 07/08/2015  - 04/17/2017  extensive coaching HFA effectiveness =    75%  From a baseline of 50% - Spirometry 12/16/2017  FEV1 2.14 (74%)  Ratio 79 p am dulera 100 x 2  - FENO 12/16/2017  =   7 p dulera 100 x 2  - 12/16/2017  After extensive coaching inhaler device  effectiveness =    75% and 6 weeks iup > change to symb  80 2bid   - spring 2020 new dog in house  - 10/22/2018   change to symbicort 160  2 bid and rx pred x 6 days  - 03/11/2019  After extensive coaching inhaler device,  effectiveness =  75%    (short ti)   - The proper method of use, as well as anticipated side effects, of a metered-dose inhaler were discussed and demonstrated to the patient.  Used golfer analogy to show her how to take practice puffs on empty inhaler before using her symbicort  Also added prednisone x one course should she exacerbate / not respond to saba appropriately as has been the case in past    Pt informed of the seriousness of COVID 19 infection as a direct risk to their health  and safey and to those of their loved ones and should continue to wear facemask in public and minimize exposure to public locations but especially avoid any area or activity where non-close contacts are not observing distancing or wearing an appropriate face mask>>> rec vaccine when offered  >>> f/u in 6 m to minimize exposure over the winter.   I had an extended discussion with the patient reviewing all relevant studies completed to date and  lasting 15 to 20 minutes of a 25 minute visit    I performed detailed device teaching using a teach back method which  extended face to face time for this visit (see above)  Each maintenance medication was reviewed in detail including emphasizing most importantly the difference between maintenance and prns and under what circumstances the prns are to be triggered using an action plan format that is not reflected in the computer generated alphabetically organized AVS which I have not found useful in most complex patients, especially with respiratory illnesses  Please see AVS for specific instructions unique to this visit that I personally wrote and verbalized to the the pt in detail and then reviewed with pt  by my nurse highlighting any  changes in therapy recommended at today's visit to their plan of care.

## 2019-06-12 NOTE — Patient Instructions (Addendum)
No change in medications   Work on inhaler technique:  relax and gently blow all the way out then take a nice smooth deep breath back in, triggering the inhaler at same time you start breathing in.  Hold for up to 5 seconds if you can. Blow out thru nose. Rinse and gargle with water when done (remember the golfer warming up)   Plan A = Automatic = Always=     Plan B = Backup (to supplement plan A, not to replace it) Only use your albuterol inhaler as a rescue medication to be used if you can't catch your breath by resting or doing a relaxed purse lip breathing pattern.  - The less you use it, the better it will work when you need it. - Ok to use the inhaler up to 2 puffs  every 4 hours if you must but call for appointment if use goes up over your usual need - Don't leave home without it !!  (think of it like the spare tire for your car)   Plan C = Crisis (instead of Plan B but only if Plan B stops working) - only use your albuterol nebulizer if you first try Plan B and it fails to help > ok to use the nebulizer up to every 4 hours but if start needing it regularly call for immediate appointment   Plan D = Deltasone  - if you start needing nebulizer or it doesn't work great, go ahead and take Prednisone 10 mg take  4 each am x 2 days,   2 each am x 2 days,  1 each am x 2 days and stop   Plan E = ER - go to ER or call 911 if all else fails      Please schedule a follow up visit in 6 months but call sooner if needed

## 2019-06-12 NOTE — Progress Notes (Signed)
@Patient  ID: Crystal Sampson, female    DOB: 08-Jun-1976 .   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  AP 03/20/2004-total IgE 1977.5 with broad elevation of almost all allergens and extremely high level against dog dander Crystal Sampson 04/2012:  FEV1 1.72 (58%), ratio 68 No response to singulair.  - changed advair to [facility] 12/15/2014 > changed to Dulera 100 due to insurance restrictions 07/08/2015  Allergy Profile 07/17/2012-total IgE 1144.9 with elevation of all potential allergens on the panel Allergy Skin Test- 03/25/13- significant positive for grass weed and tree pollens dust mite, cat   07/17/2016 follow-up asthma. NP  Patient returns for a three-month follow-up. Patient says she is doing well with her asthma. She denies any flare of wheezing or cough. Patient does get Dulera through patient assistance program. She says that she will be changing her insurance soon and would like her patient assistance papers filled back out. She does complain of some nasal drip and stuffiness intermittently. She's been using some over-the-counter nasal sprays. Denies any fever or discolored mucus, chest pain, orthopnea, PND, or increased leg swelling. She denies any increased use of albuterol. rec Continue on Dulera 2 puffs Twice daily  , rinse after use.  Saline nasal rinses As needed   Begin Flonase 1 puff Twice daily     02/14/2017  f/u ov/Crystal Sampson re: asthma/ atopic  Chief Complaint  Patient presents with   Follow-up    Breathing is overall doing well. She rarely uses albuterol inhaler or neb.    despite very poor and inconsistent hfa technique no daytime or noct symptoms or need for hfa rec Plan A = Automatic =  Dulera 100 Take 2 puffs first thing in am and then another 2 puffs about 12 hours later.  Work on inhaler technique:   Plan B =  Backup Only use your albuterol (ventolin)  Plan C = Crisis - only use your albuterol nebulizer if you first try Plan B and it fails to help > ok to use the nebulizer up to every 4 hours but if start needing it regularly call for immediate appointment   04/17/2017 acute extended ov/Crystal Sampson re: atopic asthma with cough flare Chief Complaint  Patient presents with   Acute Visit    Increased cough and congestion x 2 wks. She states cough is waking her up in the night and she is coughing so hard she is vomiting. She is coughing up green sputum.  She is using her albuterol inhaler 3-4 x per wk, and has not needed neb.   fine 2 weeks prior to OV  On just on dulera 100 Take 2 puffs first thing in am and then another 2 puffs about 12 hours later.  Getting dulera directly from [employer] contact 2 week prior to OV  > sev days later developed  sore throat dry cough / rx mucinex  Worse at hs than daytime s nasal congestion / Mucus light yellow turning to green  No rescue in hand but never takes more than one a day and could not tell me what the written action plan has for saba use  rec For cough > mucinex dm 1200 mg every 12 hours and supplement with tramadol 50 mg every 4 hours as needed Work  on inhaler technique:  Prednisone 10 mg take  4 each am x 2 days,   2 each am x 2 days,  1 each am x 2 days and stop  Doxycycline  100 mg twice twice daily x 10 days  If the cough continues >   You will need to add Try prilosec otc 20mg   Take 30-60 min before first meal of the day and Pepcid ac (famotidine) 20 mg one @  bedtime until cough is completely gone for at least a week without the need for cough suppression GERD diet  Keep your follow up appt to see me in November but call sooner if needed. > did not return as req    12/16/2017 acute extended ov/Crystal Sampson re: sob in pt with asthma on dulera 100 2bid  Chief Complaint  Patient presents with   Acute Visit    Increased SOB due to stress x 2 wks. She states occ  feels some pressure in her chest. She is using her albuterol inhaler about every other day and she has used neb x 1 in the past 2 wks.   more chest tight/sob x 2 weeks no better either saba or neb  Waking up thru the night can't breath resolves s rx p few minutes s saba Mornings are not all that bad / uses dulera w/in 30 min of rising  Cough not an issue  Has medicaid due to 6 weeks IUP  - last 16 years prior to OV  Asthma poorly controlled during that time rec Plan A = Automatic = change to symbicort 80 Take 2 puffs first thing in am and then another 2 puffs about 12 hours later.  Work on inhaler technique:  Plan B = Backup Only use your albuterol as a rescue medication  Plan C = Crisis - only use your albuterol nebulizer if you first try Plan B  Please schedule a follow up office visit in 4 weeks, sooner if needed  with all medications /inhalers/ solutions in hand so we can verify exactly what you are taking. This includes all medications from all doctors and over the counters   10/22/2018  f/u ov/Crystal Sampson re:  ? 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 She has been having panic attacks. She is out of her ventolin and is using her neb about once every other day.  Dyspnea:  Fine for months s any symbicort just rarely needing albuterol then indolent onset sob x 2 m prior to OV  And then restarted symbicort 80 / has new dog in house though allergy screen pos for dog Cough: none Sleeping:feels needing saba nightly either hfa or neb / sleeping flat /2-3 pillows under hob  SABA use: as above does better p noct otc allergy medication then wakes up and needs saba hfa or neb   rec Plan A = Automatic = symbicort 160 Take 2 puffs first thing in am and then another 2 puffs about 12 hours later.   Prednisone 10 mg take  4 each am x 2 days,   2 each am x 2 days,  1 each am x 2 days and stop  Work on inhaler technique:   Plan B = Backup Only use your albuterol inhaler Plan C =  Crisis - only use your albuterol nebulizer if you first try Plan B and it fails to help > ok to use the nebulizer up to every 4 hours but if start needing it regularly call for immediate appointment Please schedule a follow up office visit in 2 weeks    11/05/2018  f/u ov/Crystal Sampson re: asthma resolved on symb 160 2bid and pred p 6 days  Chief Complaint  Patient presents with   Follow-up    Breathing has been better since the last visit. She has had some heartburn and belching.    Dyspnea:  Not limited by breathing from desired activities   Cough: no Sleeping: able to lie flat  SABA use: none needed  02: none New reflux x sev weeks, had it during pregnancy/ assoc lots of burping  tums no better  rec Ok to try prilosec otc  20  Mg  Take 30- 60 min before your first and last meals of the day x 2 weeks and call if not better for GI eval  GERD diet Please schedule a follow up visit in 3 months but call sooner if needed    02/09/2019  f/u ov/Crystal Sampson re:  Asthma maint on symb 160 2bid / no longer on gerd rx  Chief Complaint  Patient presents with   Follow-up    Patient states that her breathing is doing alot better.   Dyspnea:  Outdoor biking including up some hills x 45 min qd  Cough: none  Sleeping: no resp symptoms SABA use: rarely  rec GERD rx   Ok to use prilosec for up 2 week but beyond that let if needed let me refer you to GI   No change in respiratory medications   03/11/2019  f/u ov/Crystal Sampson re: atopic asthma maint symb 160 2bid / no ppi  Chief Complaint  Patient presents with   Follow-up    Went to [place] and had asthma flare 03/08/19- feeling better now.   TO ER 03/08/19 x 3 h rx p cat exp rx neb/po steroids > back to baseline Dyspnea:  Outdoor biking ok up to Cough: none  Sleeping: freq  awakening not due to cough /wheeze SABA use: none  - note did not use correctly during asthma flare and hfa suboptimal  rec Plan A = Automatic = symbicort 160 Take 2 puffs first thing in am and then another 2 puffs about 12 hours later.   Work on inhaler technique:   Plan B = Backup Only use your albuterol (ventolin) inhaler as a rescue medication   Plan C = Crisis - only use your albuterol nebulizer if you first try Plan B and it fails to help > ok to use the nebulizer up to every 4 hours but if start needing it regularly call for immediate appointment   06/12/2019  f/u ov/Crystal Sampson re: asthma atppic  Chief Complaint  Patient presents with   Follow-up    3 month f/u Moderate persistent asthma in adult without complication. Patient stated breathing is at her Baseline no other issues .   Dyspnea:  Cycling fine  Cough: no Sleeping: no resp flat 3 pillows  SABA use: no recent saba  02: none    No obvious day to day or daytime variability or assoc excess/ purulent sputum or mucus plugs or hemoptysis or cp or chest tightness, subjective wheeze or overt sinus or hb symptoms.   Sleeping  without nocturnal  or early am exacerbation  of respiratory  c/o's or  need for noct saba. Also denies any obvious fluctuation of symptoms with weather or environmental changes or other aggravating or alleviating factors except as outlined above   No unusual exposure hx or h/o childhood pna or knowledge of premature birth.  Current Allergies, Complete Past Medical History, Past Surgical History, Family History, and Social History were reviewed in [employer] record.  ROS  The following are not active complaints unless bolded Hoarseness, sore throat, dysphagia, dental problems, itching, sneezing,  nasal congestion or discharge of excess mucus or purulent secretions, ear ache,   fever, chills, sweats, unintended wt loss or wt gain, classically pleuritic or exertional cp,  orthopnea pnd or  arm/hand swelling  or leg swelling, presyncope, palpitations, abdominal pain, anorexia, nausea, vomiting, diarrhea  or change in bowel habits or change in bladder habits, change in stools or change in urine, dysuria, hematuria,  rash, arthralgias, visual complaints, headache, numbness, weakness or ataxia or problems with walking or coordination,  change in mood or  memory.        Current Meds  Medication Sig   budesonide-formoterol (SYMBICORT) 160-4.5 MCG/ACT inhaler Inhale 2 puffs into the lungs 2 (two) times daily.                     Physical Exam      06/12/2019    169 03/11/2019       166  02/09/2019       155  11/05/2018       150  10/22/2018       154  01/13/2018       162  12/16/2017       166   04/17/2017    198   02/14/17 204 lb 12.8 oz (92.9 kg)  07/17/16 208 lb 3.2 oz (94.4 kg)  03/27/16 178 lb (80.7 kg)    Pleasant amb bf nad   Vital signs reviewed - Note on arrival 02 sats  99% on RA    HEENT : pt wearing mask not removed for exam due to covid -19 concerns.    NECK :  without JVD/Nodes/TM/ nl carotid upstrokes bilaterally   LUNGS: no acc muscle use,  Nl contour chest with trace late exp wheeze bilaterally without cough on insp or exp maneuvers   CV:  RRR  no s3 or murmur or increase in P2, and no edema   ABD:  soft and nontender with nl inspiratory excursion in the supine position. No bruits or organomegaly appreciated, bowel sounds nl  MS:  Nl gait/ ext warm without deformities, calf tenderness, cyanosis or clubbing No obvious joint restrictions   SKIN: warm and dry without lesions    NEURO:  alert, approp, nl sensorium with  no motor or cerebellar deficits apparent.               Assessment & Plan:

## 2019-07-14 ENCOUNTER — Encounter (HOSPITAL_COMMUNITY): Payer: Self-pay

## 2019-07-14 ENCOUNTER — Ambulatory Visit (HOSPITAL_COMMUNITY)
Admission: EM | Admit: 2019-07-14 | Discharge: 2019-07-14 | Disposition: A | Discharge status: Home / Self Care | Payer: Medicaid Other | Attending: Internal Medicine | Admitting: Internal Medicine

## 2019-07-14 ENCOUNTER — Other Ambulatory Visit: Payer: Self-pay

## 2019-07-14 DIAGNOSIS — Z113 Encounter for screening for infections with a predominantly sexual mode of transmission: Secondary | ICD-10-CM | POA: Diagnosis not present

## 2019-07-14 DIAGNOSIS — B9689 Other specified bacterial agents as the cause of diseases classified elsewhere: Secondary | ICD-10-CM | POA: Diagnosis not present

## 2019-07-14 DIAGNOSIS — N760 Acute vaginitis: Secondary | ICD-10-CM

## 2019-07-14 DIAGNOSIS — Z3202 Encounter for pregnancy test, result negative: Secondary | ICD-10-CM | POA: Diagnosis not present

## 2019-07-14 LAB — POCT URINALYSIS DIP (DEVICE)
Bilirubin Urine: NEGATIVE
Glucose, UA: NEGATIVE mg/dL
Hgb urine dipstick: NEGATIVE
Ketones, ur: NEGATIVE mg/dL
Leukocytes,Ua: NEGATIVE
Nitrite: NEGATIVE
Protein, ur: NEGATIVE mg/dL
Specific Gravity, Urine: >=1.03 (ref 1.005–1.030)
Urobilinogen, UA: 0.2 mg/dL (ref 0.0–1.0)
pH: 5 (ref 5.0–8.0)

## 2019-07-14 LAB — POC URINE PREG, ED: Preg Test, Ur: NEGATIVE

## 2019-07-14 LAB — POCT PREGNANCY, URINE: Preg Test, Ur: NEGATIVE

## 2019-07-14 MED ORDER — METRONIDAZOLE 500 MG PO TABS: 500.0000 mg | ORAL_TABLET | Freq: Two times a day (BID) | ORAL | 0 refills | Status: DC

## 2019-07-14 NOTE — Discharge Instructions (Addendum)
STD results will be available within 3-5 days. Results will be available via Mychart. If any additional treatment is warranted, we will send electronically to the pharmacy.

## 2019-07-14 NOTE — ED Triage Notes (Signed)
Pt presents with abnormal discharge for over a month.

## 2019-07-14 NOTE — ED Provider Notes (Signed)
[place]    CSN: [id number] Arrival date & time: 07/14/19  0957      History   Chief Complaint Chief Complaint  Patient presents with  . Appointment  . (10:00 Vaginal Discharge)    HPI Crystal Sampson is a 44 y.o. female.   HPI  Patient presents today with a concern for abnormal vaginal discharge. She endorses a thick, white discharge, and without significant change in odor. She recently had a sexual encounter in which the condom broke and she is concern for possible STD. Denies itching or dysuria. No fever, chills, nausea, or vomiting. Patient's last menstrual period was 06/25/2019.  Past Medical History:  Diagnosis Date  . Asthma   . Depression    goes to [facility], quit taking meds  . 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 GERD (gastroesophageal reflux disease) 11/09/2018  . Abnormal pelvic ultrasound 02/14/2018  . Anxiety 03/14/2017  . Obesity 12/15/2014  . Seasonal and perennial allergic rhinitis 10/19/2012  . 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 Moderate persistent asthma in adult without complication 01/77/9390  . Anxiety and depression 03/24/2012  . Tachycardia 03/24/2012  . Leukocytosis 03/24/2012  . Noncompliance 03/24/2012    Past Surgical History:  Procedure Laterality Date  . NO PAST SURGERIES    . WISDOM TOOTH EXTRACTION Bilateral     OB History    Gravida  4   Para  2   Term  2   Preterm      AB  1   Living  2     SAB      TAB  1   Ectopic      Multiple      Live Births  2            Home Medications    Prior to Admission medications   Medication Sig Start Date End Date Taking? Authorizing Provider  albuterol (PROAIR HFA) 108 (90 Base) MCG/ACT inhaler 2 puffs every 4 hours as needed only  if your can't catch your breath 06/12/19   Tanda Rockers, MD  albuterol (PROVENTIL) (2.5 MG/3ML) 0.083% nebulizer solution Take 6 mLs (5  mg total) by nebulization every 6 (six) hours as needed for wheezing or shortness of breath. DX: J45.40 Patient not taking: Reported on 05/20/2019 09/29/18   Tanda Rockers, MD  budesonide-formoterol [facility]) 160-4.5 MCG/ACT inhaler Inhale 2 puffs into the lungs 2 (two) times daily. 12/12/18   Tanda Rockers, MD  predniSONE (DELTASONE) 10 MG tablet Take  4 each am x 2 days,   2 each am x 2 days,  1 each am x 2 days and stop 06/12/19   Tanda Rockers, MD    Family History Family History  Problem Relation Age of Onset  . Glaucoma Mother   . Hypertension Father   . Other Neg Hx     Social History Social History   Tobacco Use  . Smoking status: Never Smoker  . Smokeless tobacco: Never Used  Substance Use Topics  . Alcohol use: No  . Drug use: No     Allergies   Penicillins   Review of Systems Review of Systems Pertinent negatives listed in HPI  Physical Exam Triage Vital Signs ED Triage Vitals  Enc Vitals Group     BP 07/14/19 1025 123/84     Pulse Rate 07/14/19 1025 80  Resp 07/14/19 1025 17     Temp 07/14/19 1025 98.6 F (37 C)     Temp Source 07/14/19 1025 Oral     SpO2 07/14/19 1025 93 %     Weight --      Height --      Head Circumference --      Peak Flow --      Pain Score 07/14/19 1026 0     Pain Loc --      Pain Edu? --      Excl. in GC? --    No data found.  Updated Vital Signs BP 123/84 (BP Location: Right Arm)   Pulse 80   Temp 98.6 F (37 C) (Oral)   Resp 17   LMP 06/25/2019   SpO2 93%   Visual Acuity Right Eye Distance:   Left Eye Distance:   Bilateral Distance:    Right Eye Near:   Left Eye Near:    Bilateral Near:     Physical Exam General appearance: alert, well developed, well nourished, cooperative and in no distress Head: Normocephalic, without obvious abnormality, atraumatic Respiratory: Respirations even and unlabored, normal respiratory rate Heart: rate and rhythm normal. No gallop or murmurs noted on exam    Abdomen: BS +, no distention, no rebound tenderness, or no mass Extremities: No gross deformities Skin: Skin color, texture, turgor normal. No rashes seen  Psych: Appropriate mood and affect. Neurologic: Alert, oriented to person, place, and time, thought content appropriate.  UC Treatments / Results  Labs (all labs ordered are listed, but only abnormal results are displayed) Labs Reviewed  POC URINE PREG, ED  CERVICOVAGINAL ANCILLARY ONLY    EKG   Radiology No results found.  Procedures Procedures (including critical care time)  Medications Ordered in UC Medications - No data to display  Initial Impression / Assessment and Plan / UC Course  I have reviewed the triage vital signs and the nursing notes.  Pertinent labs & imaging results that were available during my care of the patient were reviewed by me and considered in my medical decision making (see chart for details).   Encounter for STD screening. Treating for vaginitis, with metronidazole given history of recurrent bacterial vaginosis. Urine pregnancy and UA negative. Provided contact information for patient to establish at [facility] as she is reports, overdue to annual well women's exam,   Final Clinical Impressions(s) / UC Diagnoses   Final diagnoses:  Screening for STD (sexually transmitted disease)  Acute vaginitis     Discharge Instructions     STD results will be available within 3-5 days. Results will be available via Mychart. If any additional treatment is warranted, we will send electronically to the pharmacy.    ED Prescriptions    Medication Sig Dispense Auth. Provider   metroNIDAZOLE (FLAGYL) 500 MG tablet Take 1 tablet (500 mg total) by mouth 2 (two) times daily. 14 tablet Bing Neighbors, FNP     PDMP not reviewed this encounter.   Bing Neighbors, FNP 07/14/19 1150

## 2019-07-16 ENCOUNTER — Encounter (HOSPITAL_COMMUNITY): Payer: Self-pay

## 2019-07-16 ENCOUNTER — Telehealth (HOSPITAL_COMMUNITY): Payer: Self-pay | Admitting: Emergency Medicine

## 2019-07-16 LAB — CERVICOVAGINAL ANCILLARY ONLY
Bacterial vaginitis: NEGATIVE
Candida vaginitis: POSITIVE — AB
Chlamydia: NEGATIVE
Neisseria Gonorrhea: NEGATIVE
Trichomonas: NEGATIVE

## 2019-07-16 MED ORDER — FLUCONAZOLE 150 MG PO TABS: 150.0000 mg | ORAL_TABLET | Freq: Once | ORAL | 0 refills | Status: AC

## 2019-07-16 NOTE — Telephone Encounter (Signed)
Test for candida (yeast) was positive.  Prescription for fluconazole 150mg  po now, repeat dose in 3d if needed, #2 no refills, sent to the pharmacy of record.  Recheck or followup with PCP for further evaluation if symptoms are not improving.    Attempted to reach patient. No answer at this time. Voicemail left.    Pt returned call, notified her of results.

## 2019-08-12 ENCOUNTER — Ambulatory Visit: Payer: Medicaid Other | Admitting: Internal Medicine

## 2019-10-01 ENCOUNTER — Ambulatory Visit: Payer: Medicaid Other | Attending: Internal Medicine

## 2019-10-01 DIAGNOSIS — Z23 Encounter for immunization: Secondary | ICD-10-CM

## 2019-10-01 NOTE — Progress Notes (Signed)
   Covid-19 Vaccination Clinic  Name:  Crystal Sampson    MRN: [id number] DOB: 12-22-1975  10/01/2019  [name] was observed post Covid-19 immunization for 15 minutes without incident. She was provided with Vaccine Information Sheet and instruction to access the V-Safe system.   [name] was instructed to call 911 with any severe reactions post vaccine: Marland Kitchen Difficulty breathing  . Swelling of face and throat  . A fast heartbeat  . A bad rash all over body  . Dizziness and weakness   Immunizations Administered    Name Date Dose VIS Date Route   Pfizer COVID-19 Vaccine 10/01/2019  8:26 AM 0.3 mL 06/05/2019 Intramuscular   Manufacturer: [employer], [employer]   Lot: [id number]   NDC: [address]-3

## 2019-10-26 ENCOUNTER — Ambulatory Visit: Payer: Medicaid Other | Attending: Internal Medicine

## 2019-10-26 DIAGNOSIS — Z23 Encounter for immunization: Secondary | ICD-10-CM

## 2019-10-26 NOTE — Progress Notes (Signed)
   Covid-19 Vaccination Clinic  Name:  Crystal Sampson    MRN: [id number] DOB: 25-Mar-1976  10/26/2019  [name] was observed post Covid-19 immunization for 15 minutes without incident. She was provided with Vaccine Information Sheet and instruction to access the V-Safe system.   [name] was instructed to call 911 with any severe reactions post vaccine: Marland Kitchen Difficulty breathing  . Swelling of face and throat  . A fast heartbeat  . A bad rash all over body  . Dizziness and weakness   Immunizations Administered    Name Date Dose VIS Date Route   Pfizer COVID-19 Vaccine 10/26/2019  9:02 AM 0.3 mL 08/19/2018 Intramuscular   Manufacturer: [employer], [employer]   Lot: [id number]   NDC: [address]-4

## 2019-11-09 ENCOUNTER — Ambulatory Visit: Payer: Medicaid Other | Attending: Internal Medicine

## 2019-11-09 DIAGNOSIS — Z20822 Contact with and (suspected) exposure to covid-19: Secondary | ICD-10-CM

## 2019-11-10 LAB — SARS-COV-2, NAA 2 DAY TAT

## 2019-11-10 LAB — NOVEL CORONAVIRUS, NAA: SARS-CoV-2, NAA: NOT DETECTED

## 2019-11-27 ENCOUNTER — Encounter (HOSPITAL_COMMUNITY): Payer: Self-pay | Admitting: Emergency Medicine

## 2019-11-27 ENCOUNTER — Ambulatory Visit (HOSPITAL_COMMUNITY)
Admission: EM | Admit: 2019-11-27 | Discharge: 2019-11-27 | Disposition: A | Discharge status: Home / Self Care | Payer: Medicaid Other | Attending: Family Medicine | Admitting: Family Medicine

## 2019-11-27 ENCOUNTER — Other Ambulatory Visit: Payer: Self-pay

## 2019-11-27 DIAGNOSIS — N898 Other specified noninflammatory disorders of vagina: Secondary | ICD-10-CM | POA: Insufficient documentation

## 2019-11-27 NOTE — ED Provider Notes (Signed)
[place]    CSN: [id number] Arrival date & time: 11/27/19  1258      History   Chief Complaint Chief Complaint  Patient presents with  . Vaginal Discharge    HPI MEMORI SAMMON is a 44 y.o. female.   Patient is a 44 year old female presents today with vaginal discharge.  This started approximate 1 week ago after her menstrual cycle stopped and after sexual intercourse.  She has had intercourse with this partner previously.  She would like to be checked for STDs.  Denies any associate abdominal pain, back pain, fever, dysuria, hematuria urinary frequency. Patient's last menstrual period was 11/12/2019.  ROS per HPI      Past Medical History:  Diagnosis Date  . Asthma   . Depression    goes to [facility], quit taking meds  . 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 GERD (gastroesophageal reflux disease) 11/09/2018  . Abnormal pelvic ultrasound 02/14/2018  . Anxiety 03/14/2017  . Obesity 12/15/2014  . Seasonal and perennial allergic rhinitis 10/19/2012  . 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 Moderate persistent asthma in adult without complication 78/93/8101  . Anxiety and depression 03/24/2012  . Tachycardia 03/24/2012  . Leukocytosis 03/24/2012  . Noncompliance 03/24/2012    Past Surgical History:  Procedure Laterality Date  . NO PAST SURGERIES    . WISDOM TOOTH EXTRACTION Bilateral     OB History    Gravida  4   Para  2   Term  2   Preterm      AB  1   Living  2     SAB      TAB  1   Ectopic      Multiple      Live Births  2            Home Medications    Prior to Admission medications   Medication Sig Start Date End Date Taking? Authorizing Provider  budesonide-formoterol (SYMBICORT) 160-4.5 MCG/ACT inhaler Inhale 2 puffs into the lungs 2 (two) times daily. 12/12/18  Yes Tanda Rockers, MD  albuterol (PROAIR HFA) 108 (90 Base) MCG/ACT  inhaler 2 puffs every 4 hours as needed only  if your can't catch your breath 06/12/19   Tanda Rockers, MD  albuterol (PROVENTIL) (2.5 MG/3ML) 0.083% nebulizer solution Take 6 mLs (5 mg total) by nebulization every 6 (six) hours as needed for wheezing or shortness of breath. DX: J45.40 Patient not taking: Reported on 05/20/2019 09/29/18   Tanda Rockers, MD  metroNIDAZOLE (FLAGYL) 500 MG tablet Take 1 tablet (500 mg total) by mouth 2 (two) times daily. 07/14/19   Scot Jun, FNP  predniSONE (DELTASONE) 10 MG tablet Take  4 each am x 2 days,   2 each am x 2 days,  1 each am x 2 days and stop 06/12/19   Tanda Rockers, MD    Family History Family History  Problem Relation Age of Onset  . Glaucoma Mother   . Hypertension Father   . Other Neg Hx     Social History Social History   Tobacco Use  . Smoking status: Never Smoker  . Smokeless tobacco: Never Used  Substance Use Topics  . Alcohol use: No  . Drug use: No     Allergies   Penicillins   Review of Systems Review of Systems   Physical  Exam Triage Vital Signs ED Triage Vitals  Enc Vitals Group     BP 11/27/19 1331 113/77     Pulse Rate 11/27/19 1331 74     Resp 11/27/19 1331 16     Temp 11/27/19 1331 98.2 F (36.8 C)     Temp Source 11/27/19 1331 Oral     SpO2 11/27/19 1331 96 %     Weight --      Height --      Head Circumference --      Peak Flow --      Pain Score 11/27/19 1407 0     Pain Loc --      Pain Edu? --      Excl. in GC? --    No data found.  Updated Vital Signs BP 113/77 (BP Location: Left Arm)   Pulse 74   Temp 98.2 F (36.8 C) (Oral)   Resp 16   LMP 11/12/2019   SpO2 96%   Breastfeeding No   Visual Acuity Right Eye Distance:   Left Eye Distance:   Bilateral Distance:    Right Eye Near:   Left Eye Near:    Bilateral Near:     Physical Exam Vitals and nursing note reviewed.  Constitutional:      General: She is not in acute distress.    Appearance: Normal appearance.  She is not ill-appearing, toxic-appearing or diaphoretic.  HENT:     Head: Normocephalic.     Nose: Nose normal.  Eyes:     Conjunctiva/sclera: Conjunctivae normal.  Pulmonary:     Effort: Pulmonary effort is normal.  Musculoskeletal:        General: Normal range of motion.     Cervical back: Normal range of motion.  Skin:    General: Skin is warm and dry.     Findings: No rash.  Neurological:     Mental Status: She is alert.  Psychiatric:        Mood and Affect: Mood normal.      UC Treatments / Results  Labs (all labs ordered are listed, but only abnormal results are displayed) Labs Reviewed  CERVICOVAGINAL ANCILLARY ONLY    EKG   Radiology No results found.  Procedures Procedures (including critical care time)  Medications Ordered in UC Medications - No data to display  Initial Impression / Assessment and Plan / UC Course  I have reviewed the triage vital signs and the nursing notes.  Pertinent labs & imaging results that were available during my care of the patient were reviewed by me and considered in my medical decision making (see chart for details).     Vaginal discharge Sending swab for testing Will treat based on results. Final Clinical Impressions(s) / UC Diagnoses   Final diagnoses:  Vaginal discharge     Discharge Instructions     We will send a swab for testing and call you with any positive results.  You can check your MyChart for results    ED Prescriptions    None     PDMP not reviewed this encounter.   Dahlia Byes A, NP 11/27/19 1421

## 2019-11-27 NOTE — Discharge Instructions (Signed)
We will send a swab for testing and call you with any positive results.  You can check your MyChart for results

## 2019-11-27 NOTE — ED Triage Notes (Signed)
Pt c/o vaginal discharge onset about a week ago. Pt states she had sexual intercourse with the same partner and would like to be tested for STDs.

## 2019-11-30 ENCOUNTER — Telehealth (HOSPITAL_COMMUNITY): Payer: Self-pay | Admitting: Orthopedic Surgery

## 2019-11-30 LAB — CERVICOVAGINAL ANCILLARY ONLY
Bacterial Vaginitis (gardnerella): NEGATIVE
Candida Glabrata: NEGATIVE
Candida Vaginitis: POSITIVE — AB
Chlamydia: NEGATIVE
Comment: NEGATIVE
Comment: NEGATIVE
Comment: NEGATIVE
Comment: NEGATIVE
Comment: NEGATIVE
Comment: NORMAL
Neisseria Gonorrhea: NEGATIVE
Trichomonas: NEGATIVE

## 2019-11-30 MED ORDER — FLUCONAZOLE 150 MG PO TABS: 150.0000 mg | ORAL_TABLET | Freq: Every day | ORAL | 0 refills | Status: AC

## 2019-12-11 ENCOUNTER — Ambulatory Visit: Payer: Medicaid Other | Admitting: Adult Health

## 2019-12-11 ENCOUNTER — Ambulatory Visit: Payer: Medicaid Other | Admitting: Internal Medicine

## 2019-12-17 ENCOUNTER — Ambulatory Visit (INDEPENDENT_AMBULATORY_CARE_PROVIDER_SITE_OTHER): Payer: Medicaid Other | Admitting: Adult Health

## 2019-12-17 ENCOUNTER — Encounter: Payer: Self-pay | Admitting: Adult Health

## 2019-12-17 ENCOUNTER — Other Ambulatory Visit: Payer: Self-pay

## 2019-12-17 DIAGNOSIS — J302 Other seasonal allergic rhinitis: Secondary | ICD-10-CM | POA: Diagnosis not present

## 2019-12-17 DIAGNOSIS — J3089 Other allergic rhinitis: Secondary | ICD-10-CM | POA: Diagnosis not present

## 2019-12-17 DIAGNOSIS — J4540 Moderate persistent asthma, uncomplicated: Secondary | ICD-10-CM

## 2019-12-17 NOTE — Progress Notes (Signed)
Virtual Visit via Telephone Note  I connected with Crystal Sampson on 12/17/19 at  3:30 PM EDT by telephone and verified that I am speaking with the correct person using two identifiers.  Location: Patient: Home  Provider: Office    I discussed the limitations, risks, security and privacy concerns of performing an evaluation and management service by telephone and the availability of in person appointments. I also discussed with the patient that there may be a patient responsible charge related to this service. The patient expressed understanding and agreed to proceed.   History of Present Illness: 44 year old female never smoker followed for allergic asthma and allergic rhinitis  Today's televisit is a 41-month follow-up for asthma.  Since last visit patient says she has been doing exceptionally well.  She had no flare of her of her asthma with no cough or wheezing.  Has had no albuterol use.  Says that she has had the best breathing that she is had in a long time.  She remains on Symbicort twice daily.  She says she is starting to be more active now and is started to get back out in the community since having her COVID-19 vaccine. She denies any chest pain orthopnea PND or leg swelling.  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 DX: J45.40 (Patient not taking: Reported on 05/20/2019) 75 mL 3   budesonide-formoterol (SYMBICORT) 160-4.5 MCG/ACT inhaler Inhale 2 puffs into the lungs 2 (two) times daily. 1 Inhaler 11   metroNIDAZOLE (FLAGYL) 500 MG tablet Take 1 tablet (500 mg total) by mouth 2 (two) times daily. 14 tablet 0   predniSONE (DELTASONE) 10 MG tablet Take  4 each am x 2 days,   2 each am x 2 days,  1 each am x 2 days and stop 14 tablet 0   No current facility-administered medications on file prior to visit.    Observations/Objective: [place] 2001: FEV1 2.38 (68%), ratio 80.  AP 03/20/2004-total IgE 1977.5 with broad elevation of almost all allergens and extremely high level against dog dander Arlyce Harman 04/2012: FEV1 1.72 (58%), ratio 68 No response to singulair.  - changed advair to BREO 12/15/2014>changed to Dulera 100 due to insurance restrictions 07/08/2015 Allergy Profile 07/17/2012-total IgE 1144.9 with elevation of all potential allergens on the panel Allergy Skin Test- 03/25/13- significant positive for grass weed and tree pollens dust mite, cat  Assessment and Plan: Asthma-excellent control on present regimen  Allergic rhinitis currently under good control  Plan  Patient Instructions  Continue on Symbicort 2 puffs twice daily, rinse after use Albuterol inhaler as needed Activity as tolerated Follow-up in 6 months with Dr. Melvyn Novas and as needed     Follow Up  Instructions: Follow-up in 6 months and as needed   I discussed the assessment and treatment plan with the patient. The patient was provided an opportunity to ask questions and all were answered. The patient agreed with the plan and demonstrated an understanding of the instructions.   The patient was advised to call back or seek an in-person evaluation if the symptoms worsen or if the  condition fails to improve as anticipated.  I provided 22 minutes of non-face-to-face time during this encounter.   Rubye Oaks, NP

## 2019-12-17 NOTE — Patient Instructions (Signed)
Continue on Symbicort 2 puffs twice daily, rinse after use Albuterol inhaler as needed Activity as tolerated Follow-up in 6 months with Dr. Sherene Sires and as needed

## 2019-12-18 ENCOUNTER — Other Ambulatory Visit: Payer: Self-pay

## 2019-12-18 ENCOUNTER — Ambulatory Visit (HOSPITAL_COMMUNITY)
Admission: EM | Admit: 2019-12-18 | Discharge: 2019-12-18 | Disposition: A | Discharge status: Home / Self Care | Payer: Medicaid Other | Attending: Family Medicine | Admitting: Family Medicine

## 2019-12-18 ENCOUNTER — Encounter (HOSPITAL_COMMUNITY): Payer: Self-pay

## 2019-12-18 DIAGNOSIS — L729 Follicular cyst of the skin and subcutaneous tissue, unspecified: Secondary | ICD-10-CM | POA: Diagnosis not present

## 2019-12-18 NOTE — Discharge Instructions (Signed)
This is a cyst You need to see dermatology for this.  I will give you a referral to dermatology. If this area gets more painful, swollen or red and starts draining you will need to be seen for possible incision and drainage.

## 2019-12-18 NOTE — ED Provider Notes (Signed)
[place]    CSN: [id number] Arrival date & time: 12/18/19  1114      History   Chief Complaint Chief Complaint  Patient presents with  . Abscess    HPI Crystal Sampson is a 44 y.o. female.   Patient is a 25-year female presents today with cyst to upper back area.  She noticed this this past weekend.  It is been mildly tender.  No significant swelling, drainage or redness.  No history of same.  No fevers, chills.  ROS per HPI      Past Medical History:  Diagnosis Date  . Asthma   . Depression    goes to [facility], quit taking meds  . 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 GERD (gastroesophageal reflux disease) 11/09/2018  . Abnormal pelvic ultrasound 02/14/2018  . Anxiety 03/14/2017  . Obesity 12/15/2014  . Seasonal and perennial allergic rhinitis 10/19/2012  . 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 Moderate persistent asthma in adult without complication 16/12/3708  . Anxiety and depression 03/24/2012  . Tachycardia 03/24/2012  . Leukocytosis 03/24/2012  . Noncompliance 03/24/2012    Past Surgical History:  Procedure Laterality Date  . NO PAST SURGERIES    . WISDOM TOOTH EXTRACTION Bilateral     OB History    Gravida  4   Para  2   Term  2   Preterm      AB  1   Living  2     SAB      TAB  1   Ectopic      Multiple      Live Births  2            Home Medications    Prior to Admission medications   Medication Sig Start Date End Date Taking? Authorizing Provider  albuterol (PROAIR HFA) 108 (90 Base) MCG/ACT inhaler 2 puffs every 4 hours as needed only  if your can't catch your breath 06/12/19   Tanda Rockers, MD  albuterol (PROVENTIL) (2.5 MG/3ML) 0.083% nebulizer solution Take 6 mLs (5 mg total) by nebulization every 6 (six) hours as needed for wheezing or shortness of breath. DX: J45.40 Patient not taking: Reported on 05/20/2019 09/29/18    Tanda Rockers, MD  budesonide-formoterol [facility]) 160-4.5 MCG/ACT inhaler Inhale 2 puffs into the lungs 2 (two) times daily. 12/12/18   Tanda Rockers, MD  metroNIDAZOLE (FLAGYL) 500 MG tablet Take 1 tablet (500 mg total) by mouth 2 (two) times daily. 07/14/19   Scot Jun, FNP  predniSONE (DELTASONE) 10 MG tablet Take  4 each am x 2 days,   2 each am x 2 days,  1 each am x 2 days and stop 06/12/19   Tanda Rockers, MD    Family History Family History  Problem Relation Age of Onset  . Glaucoma Mother   . Hypertension Father   . Other Neg Hx     Social History Social History   Tobacco Use  . Smoking status: Never Smoker  . Smokeless tobacco: Never Used  Substance Use Topics  . Alcohol use: No  . Drug use: No     Allergies   Penicillins   Review of Systems Review of Systems   Physical Exam Triage Vital Signs ED Triage Vitals  Enc Vitals Group     BP 12/18/19 1121 132/77  Pulse Rate 12/18/19 1121 79     Resp 12/18/19 1121 18     Temp 12/18/19 1121 98.4 F (36.9 C)     Temp Source 12/18/19 1121 Oral     SpO2 12/18/19 1121 100 %     Weight 12/18/19 1127 190 lb 6.4 oz (86.4 kg)     Height --      Head Circumference --      Peak Flow --      Pain Score 12/18/19 1120 0     Pain Loc --      Pain Edu? --      Excl. in GC? --    No data found.  Updated Vital Signs BP 132/77 (BP Location: Right Arm)   Pulse 79   Temp 98.4 F (36.9 C) (Oral)   Resp 18   Wt 190 lb 6.4 oz (86.4 kg)   LMP 12/11/2019   SpO2 100%   BMI 28.95 kg/m   Visual Acuity Right Eye Distance:   Left Eye Distance:   Bilateral Distance:    Right Eye Near:   Left Eye Near:    Bilateral Near:     Physical Exam Vitals and nursing note reviewed.  Constitutional:      General: She is not in acute distress.    Appearance: Normal appearance. She is not ill-appearing, toxic-appearing or diaphoretic.  HENT:     Head: Normocephalic.     Nose: Nose normal.  Eyes:      Conjunctiva/sclera: Conjunctivae normal.  Pulmonary:     Effort: Pulmonary effort is normal.  Musculoskeletal:        General: Normal range of motion.     Cervical back: Normal range of motion.  Skin:    General: Skin is warm and dry.     Findings: No rash.          Comments: Small approximated 1 cm x 1 cm cyst to mid upper back. No redness, drainage, significant swelling. Nontender to touch.  Neurological:     Mental Status: She is alert.  Psychiatric:        Mood and Affect: Mood normal.      UC Treatments / Results  Labs (all labs ordered are listed, but only abnormal results are displayed) Labs Reviewed - No data to display  EKG   Radiology No results found.  Procedures Procedures (including critical care time)  Medications Ordered in UC Medications - No data to display  Initial Impression / Assessment and Plan / UC Course  I have reviewed the triage vital signs and the nursing notes.  Pertinent labs & imaging results that were available during my care of the patient were reviewed by me and considered in my medical decision making (see chart for details).     Cyst of skin Dermatology referral The cyst is not infected at this time.  No need for antibiotics or I&D at this time Follow up as needed for continued or worsening symptoms  Final Clinical Impressions(s) / UC Diagnoses   Final diagnoses:  Cyst of skin     Discharge Instructions     This is a cyst You need to see dermatology for this.  I will give you a referral to dermatology. If this area gets more painful, swollen or red and starts draining you will need to be seen for possible incision and drainage.    ED Prescriptions    None     PDMP not reviewed this encounter.   Crystal Sampson,  Nile Prisk A, NP 12/18/19 1221

## 2019-12-18 NOTE — ED Triage Notes (Signed)
Pt is her with an abscess that started this weekend, pt has not taken anything to relieve discomfort.

## 2019-12-21 ENCOUNTER — Ambulatory Visit: Payer: Medicaid Other | Admitting: Adult Health

## 2019-12-31 ENCOUNTER — Telehealth: Payer: Self-pay | Admitting: Internal Medicine

## 2019-12-31 NOTE — Telephone Encounter (Signed)
Patient states she is okay for now to wait and see Crystal Sampson tomorrow. Nothing further needed at this time.

## 2020-01-01 ENCOUNTER — Ambulatory Visit: Payer: Medicaid Other | Admitting: Pulmonary Disease

## 2020-01-01 ENCOUNTER — Encounter: Payer: Self-pay | Admitting: Pulmonary Disease

## 2020-01-01 ENCOUNTER — Other Ambulatory Visit: Payer: Self-pay

## 2020-01-01 VITALS — BP 130/72 | HR 76 | Temp 98.2°F | Ht 68.0 in | Wt 189.4 lb

## 2020-01-01 DIAGNOSIS — J4540 Moderate persistent asthma, uncomplicated: Secondary | ICD-10-CM

## 2020-01-01 DIAGNOSIS — J302 Other seasonal allergic rhinitis: Secondary | ICD-10-CM

## 2020-01-01 DIAGNOSIS — D824 Hyperimmunoglobulin E [IgE] syndrome: Secondary | ICD-10-CM | POA: Diagnosis not present

## 2020-01-01 DIAGNOSIS — J3089 Other allergic rhinitis: Secondary | ICD-10-CM | POA: Diagnosis not present

## 2020-01-01 LAB — CBC WITH DIFFERENTIAL/PLATELET
Basophils Absolute: 0.1 10*3/uL (ref 0.0–0.1)
Basophils Relative: 0.7 % (ref 0.0–3.0)
Eosinophils Absolute: 0.3 10*3/uL (ref 0.0–0.7)
Eosinophils Relative: 2.8 % (ref 0.0–5.0)
HCT: 40.1 % (ref 36.0–46.0)
Hemoglobin: 13.8 g/dL (ref 12.0–15.0)
Lymphocytes Relative: 14.1 % (ref 12.0–46.0)
Lymphs Abs: 1.4 10*3/uL (ref 0.7–4.0)
MCHC: 34.3 g/dL (ref 30.0–36.0)
MCV: 95.3 fl (ref 78.0–100.0)
Monocytes Absolute: 0.7 10*3/uL (ref 0.1–1.0)
Monocytes Relative: 7.2 % (ref 3.0–12.0)
Neutro Abs: 7.4 10*3/uL (ref 1.4–7.7)
Neutrophils Relative %: 75.2 % (ref 43.0–77.0)
Platelets: 319 10*3/uL (ref 150.0–400.0)
RBC: 4.21 Mil/uL (ref 3.87–5.11)
RDW: 13.1 % (ref 11.5–15.5)
WBC: 9.8 10*3/uL (ref 4.0–10.5)

## 2020-01-01 MED ORDER — PREDNISONE 10 MG PO TABS: ORAL_TABLET | ORAL | 0 refills | Status: DC

## 2020-01-01 MED ORDER — AZITHROMYCIN 250 MG PO TABS: ORAL_TABLET | ORAL | 0 refills | Status: DC

## 2020-01-01 NOTE — Assessment & Plan Note (Signed)
No previous response to Singulair per documentation Was on allergy immunotherapy for 3 years stopped in 2017 Significant environmental allergens in 2014 Significantly elevated IgE  Plan: Repeat lab work today May need to consider starting daily antihistamine Would recommend nasal saline rinses

## 2020-01-01 NOTE — Patient Instructions (Addendum)
You were seen today by Crystal Ceo, NP  for:    1. Moderate persistent asthma in adult without complication  - CBC with Differential/Platelet; Future - IgE; Future - predniSONE (DELTASONE) 10 MG tablet; Take 2 tablets (20mg  total) daily for the next 5 days. Take in the AM with food.  Dispense: 10 tablet; Refill: 0 - azithromycin (ZITHROMAX) 250 MG tablet; 500mg  (two tablets) today, then 250mg  (1 tablet) for the next 4 days  Dispense: 6 tablet; Refill: 0  Continue Symbicort 160 >>> 2 puffs in the morning right when you wake up, rinse out your mouth after use, 12 hours later 2 puffs, rinse after use >>> Take this daily, no matter what >>> This is not a rescue inhaler   Only use your albuterol as a rescue medication to be used if you can't catch your breath by resting or doing a relaxed purse lip breathing pattern.  - The less you use it, the better it will work when you need it. - Ok to use up to 2 puffs  every 4 hours if you must but call for immediate appointment if use goes up over your usual need - Don't leave home without it !!  (think of it like the spare tire for your car)    2. Seasonal and perennial allergic rhinitis  We will repeat lab work today  Based on the lab work we may consider starting on a medication like Singulair or a daily allergy pill  He can always consider starting:  Please start taking a daily antihistamine:  >>>choose one of: zyrtec, claritin, allegra, or xyzal  >>>these are over the counter medications  >>>can choose generic option  >>>take daily  >>>this medication helps with allergies, post nasal drip, and cough   Start nasal saline rinses twice daily Use distilled water Shake well Get bottle lukewarm like a baby bottle    We recommend today:  Orders Placed This Encounter  Procedures  . CBC with Differential/Platelet    Standing Status:   Future    Standing Expiration Date:   12/31/2020  . IgE    Standing Status:   Future    Standing  Expiration Date:   12/31/2020   Orders Placed This Encounter  Procedures  . CBC with Differential/Platelet  . IgE   Meds ordered this encounter  Medications  . predniSONE (DELTASONE) 10 MG tablet    Sig: Take 2 tablets (20mg  total) daily for the next 5 days. Take in the AM with food.    Dispense:  10 tablet    Refill:  0  . azithromycin (ZITHROMAX) 250 MG tablet    Sig: 500mg  (two tablets) today, then 250mg  (1 tablet) for the next 4 days    Dispense:  6 tablet    Refill:  0    Follow Up:    Return in about 2 months (around 03/03/2020), or if symptoms worsen or fail to improve, for Follow up with Dr. 03/03/2021.   Please do your part to reduce the spread of COVID-19:      Reduce your risk of any infection  and COVID19 by using the similar precautions used for avoiding the common cold or flu:  03/03/2021 Wash your hands often with soap and warm water for at least 20 seconds.  If soap and water are not readily available, use an alcohol-based hand sanitizer with at least 60% alcohol.  . If coughing or sneezing, cover your mouth and nose by coughing or sneezing into  the elbow areas of your shirt or coat, into a tissue or into your sleeve (not your hands). Drinda Butts A MASK when in public  . Avoid shaking hands with others and consider head nods or verbal greetings only. . Avoid touching your eyes, nose, or mouth with unwashed hands.  . Avoid close contact with people who are sick. . Avoid places or events with large numbers of people in one location, like concerts or sporting events. . If you have some symptoms but not all symptoms, continue to monitor at home and seek medical attention if your symptoms worsen. . If you are having a medical emergency, call 911.   ADDITIONAL HEALTHCARE OPTIONS FOR PATIENTS  [facility] / e-Visit: [email or web address]         [facility]: [phone]  [facility]: [phone]                     [facility]: [phone]     It is flu season:   >>> Best ways to protect herself from the flu: Receive the yearly flu vaccine, practice good hand hygiene washing with soap and also using hand sanitizer when available, eat a nutritious meals, get adequate rest, hydrate appropriately   Please contact the office if your symptoms worsen or you have concerns that you are not improving.   Thank you for choosing [facility] for your healthcare, and for allowing [place] to partner with you on your healthcare journey. I am thankful to be able to provide care to you today.   Elisha Headland FNP-C

## 2020-01-01 NOTE — Assessment & Plan Note (Signed)
Plan: Repeat lab work today

## 2020-01-01 NOTE — Progress Notes (Signed)
@Patient  ID: Crystal Sampson, female    DOB: 11/10/1975, 44 y.o.   MRN: [id number]  Chief Complaint  Patient presents with   Follow-up    sputum yellow with tint of blood [facility]    Referring provider: No ref. provider found  HPI:  44 year old female never smoker followed in our office for asthma  PMH: Anxiety depression, hyper IgE syndrome, obesity Smoker/ Smoking History: Never smoker Maintenance: Symbicort 160 Pt of: Dr. Sherene SiresWert   01/01/2020  - Visit   44 year old female never smoker followed in our office for asthma.  Patient was last seen telephonically in June/2021 by TP NP.  It was felt at that time that patient was at a stable interval of her asthma.  Patient contacted our office on 12/31/2019.  Reporting she was having acute worsening symptoms with her breathing.  She was scheduled for follow-up with our office on 01/01/2020.  Patient reports that [place] recently traveled to [place]  She left her rescue inhaler there.  She reports that after traveling back from [place] she has had a cough with congestion with yellow mucus.  She has a previous history of significantly elevated IgE as well as multiple environmental allergens on her Rast panel for about 7 years ago.  One of her highest results being dog dander.  She currently has a daughter that lives with her.  Patient denies significant wheezing or cough.  She wanted to present to our office for evaluation due to the mucus production as well as the increase shortness of breath.  She remains adherent to her Symbicort 160.  Questionaires / Pulmonary Flowsheets:   ACT:  Asthma Control Test ACT Total Score  01/01/2020 15  03/11/2019 15    MMRC: No flowsheet data found.  Epworth:  No flowsheet data found.  Tests:   FENO:  Lab Results  Component Value Date   NITRICOXIDE 7 12/16/2017    PFT: No flowsheet data found.  WALK:  No flowsheet data found.  Imaging: No results found.  Lab Results:  CBC    Component  Value Date/Time   WBC 6.7 02/03/2018 1421   WBC 12.8 (H) 12/08/2017 2049   RBC 4.23 02/03/2018 1421   RBC 3.90 12/08/2017 2049   HGB 13.5 02/03/2018 1421   HCT 40.2 02/03/2018 1421   PLT 402 02/03/2018 1421   MCV 95 02/03/2018 1421   MCH 31.9 02/03/2018 1421   MCH 32.3 12/08/2017 2049   MCHC 33.6 02/03/2018 1421   MCHC 35.3 12/08/2017 2049   RDW 13.7 02/03/2018 1421   LYMPHSABS 2.0 02/03/2018 1421   MONOABS 1.0 10/30/2012 0830   EOSABS 0.2 02/03/2018 1421   BASOSABS 0.0 02/03/2018 1421    BMET    Component Value Date/Time   NA 137 12/10/2014 0604   K 4.3 12/10/2014 0604   CL 106 12/10/2014 0604   CO2 23 12/10/2014 0604   GLUCOSE 94 12/10/2014 0604   BUN 9 12/10/2014 0604   CREATININE 0.79 12/10/2014 0604   CALCIUM 9.1 12/10/2014 0604   GFRNONAA >60 12/10/2014 0604   GFRAA >60 12/10/2014 0604    BNP    Component Value Date/Time   BNP 6.6 12/10/2014 0604    ProBNP No results found for: PROBNP  Specialty Problems      Pulmonary Problems   Moderate persistent asthma in adult without complication     Onset childhood Cleda DaubSpiro 2001: FEV1 2.38 (68%), ratio 80. AP 03/20/2004  IgE 1877, RAST very abnormal 2007. Aspergillus  CF <1:8 [name] 04/2012:  FEV1 1.72 (58%), ratio 68 No response to singulair.  Allergy Skin Test- 03/25/13- significant positive for grass weed and tree pollens dust mite, cat - changed advair to BREO 12/15/2014 > changed to [facility] 100 due to insurance restrictions 07/08/2015  - 04/17/2017  extensive coaching HFA effectiveness =    75%  From a baseline of 50% - Spirometry 12/16/2017  FEV1 2.14 (74%)  Ratio 79 p am dulera 100 x 2  - FENO 12/16/2017  =   7 p dulera 100 x 2  - 12/16/2017  After extensive coaching inhaler device  effectiveness =    75% and 6 weeks iup > change to symb  80 2bid   - spring 2020 new dog in house  - 10/22/2018   change to symbicort 160  2 bid and rx pred x 6 days  - 03/11/2019  After extensive coaching inhaler device,  effectiveness =   75%    (short ti)        Seasonal and perennial allergic rhinitis    Allergy Profile 07/17/2012-total IgE 1144.9 with elevation of all potential allergens on the panel Allergy Skin Test- 03/25/13- significant positive for grass weed and tree pollens dust mite, cat Started allergy vaccine 03/2013> stopped around 2017           Allergies  Allergen Reactions   Penicillins Swelling    Immunization History  Administered Date(s) Administered   Influenza Split 03/25/2012   Influenza,inj,Quad PF,6+ Mos 03/25/2013, 03/27/2016   PFIZER SARS-COV-2 Vaccination 10/01/2019, 10/26/2019   Pneumococcal Polysaccharide-23 03/25/2012    Past Medical History:  Diagnosis Date   Asthma    Depression    goes to [facility], quit taking meds   HSV (herpes simplex virus) infection    last outbreak 2007    Tobacco History: Social History   Tobacco Use  Smoking Status Never Smoker  Smokeless Tobacco Never Used   Counseling given: Yes   Continue to not smoke  Outpatient Encounter Medications as of 01/01/2020  Medication Sig   albuterol (PROAIR HFA) 108 (90 Base) MCG/ACT inhaler 2 puffs every 4 hours as needed only  if your can't catch your breath   albuterol (PROVENTIL) (2.5 MG/3ML) 0.083% nebulizer solution Take 6 mLs (5 mg total) by nebulization every 6 (six) hours as needed for wheezing or shortness of breath. DX: J45.40   budesonide-formoterol (SYMBICORT) 160-4.5 MCG/ACT inhaler Inhale 2 puffs into the lungs 2 (two) times daily.   azithromycin (ZITHROMAX) 250 MG tablet 500mg  (two tablets) today, then 250mg  (1 tablet) for the next 4 days   metroNIDAZOLE (FLAGYL) 500 MG tablet Take 1 tablet (500 mg total) by mouth 2 (two) times daily. (Patient not taking: Reported on 01/01/2020)   predniSONE (DELTASONE) 10 MG tablet Take 2 tablets (20mg  total) daily for the next 5 days. Take in the AM with food.   [DISCONTINUED] predniSONE (DELTASONE) 10 MG tablet Take  4 each am x 2 days,   2  each am x 2 days,  1 each am x 2 days and stop (Patient not taking: Reported on 01/01/2020)   No facility-administered encounter medications on file as of 01/01/2020.     Review of Systems  Review of Systems  Constitutional: Negative for activity change, fatigue and fever.  HENT: Positive for congestion. Negative for sinus pressure, sinus pain and sore throat.   Respiratory: Positive for cough and shortness of breath. Negative for wheezing.   Cardiovascular: Negative for chest pain and palpitations.  Gastrointestinal: Negative for diarrhea, nausea and vomiting.  Musculoskeletal: Negative for arthralgias.  Neurological: Negative for dizziness.  Psychiatric/Behavioral: Negative for sleep disturbance. The patient is not nervous/anxious.      Physical Exam  BP 130/72 (BP Location: Left Arm, Cuff Size: Normal)    Pulse 76    Temp 98.2 F (36.8 C) (Oral)    Ht 5\' 8"  (1.727 m)    Wt 189 lb 6.4 oz (85.9 kg)    LMP 12/11/2019    SpO2 97%    BMI 28.80 kg/m   Wt Readings from Last 5 Encounters:  01/01/20 189 lb 6.4 oz (85.9 kg)  12/18/19 190 lb 6.4 oz (86.4 kg)  06/12/19 169 lb 6.4 oz (76.8 kg)  03/11/19 166 lb (75.3 kg)  02/09/19 155 lb 12.8 oz (70.7 kg)    BMI Readings from Last 5 Encounters:  01/01/20 28.80 kg/m  12/18/19 28.95 kg/m  06/12/19 25.76 kg/m  03/11/19 25.24 kg/m  02/09/19 23.69 kg/m     Physical Exam Vitals and nursing note reviewed.  Constitutional:      General: She is not in acute distress.    Appearance: Normal appearance. She is normal weight.  HENT:     Head: Normocephalic and atraumatic.     Right Ear: Tympanic membrane, ear canal and external ear normal. There is no impacted cerumen.     Left Ear: Tympanic membrane, ear canal and external ear normal. There is no impacted cerumen.     Nose: Congestion and rhinorrhea present.     Mouth/Throat:     Mouth: Mucous membranes are moist.     Pharynx: Oropharynx is clear.     Comments: Postnasal drip Eyes:       Pupils: Pupils are equal, round, and reactive to light.  Cardiovascular:     Rate and Rhythm: Normal rate and regular rhythm.     Pulses: Normal pulses.     Heart sounds: Normal heart sounds. No murmur heard.   Pulmonary:     Effort: Pulmonary effort is normal. No respiratory distress.     Breath sounds: No decreased air movement. Wheezing (Slight expiratory wheeze) present. No decreased breath sounds or rales.  Musculoskeletal:     Cervical back: Normal range of motion.  Skin:    General: Skin is warm and dry.     Capillary Refill: Capillary refill takes less than 2 seconds.  Neurological:     General: No focal deficit present.     Mental Status: She is alert and oriented to person, place, and time. Mental status is at baseline.     Gait: Gait normal.  Psychiatric:        Mood and Affect: Mood normal.        Behavior: Behavior normal.        Thought Content: Thought content normal.        Judgment: Judgment normal.       Assessment & Plan:   Moderate persistent asthma in adult without complication Suspect asthma exacerbation today  Plan: Lab work today Short course of prednisone Azithromycin today 51-month follow-up with our office Continue Symbicort 160 If patient continues to have flares may need to consider biologic agent based off lab work results  Seasonal and perennial allergic rhinitis No previous response to Singulair per documentation Was on allergy immunotherapy for 3 years stopped in 2017 Significant environmental allergens in 2014 Significantly elevated IgE  Plan: Repeat lab work today May need to consider starting daily antihistamine Would recommend nasal  saline rinses  Hyper-IgE syndrome Plan: Repeat lab work today    Return in about 2 months (around 03/03/2020), or if symptoms worsen or fail to improve, for Follow up with Dr. Sherene Sires.   Coral Ceo, NP 01/01/2020   This appointment required 32 minutes of patient care (this includes  precharting, chart review, review of results, face-to-face care, etc.).

## 2020-01-01 NOTE — Assessment & Plan Note (Signed)
Suspect asthma exacerbation today  Plan: Lab work today Short course of prednisone Azithromycin today 57-month follow-up with our office Continue Symbicort 160 If patient continues to have flares may need to consider biologic agent based off lab work results

## 2020-01-04 ENCOUNTER — Telehealth: Payer: Self-pay | Admitting: Pulmonary Disease

## 2020-01-04 LAB — IGE: IgE (Immunoglobulin E), Serum: 2259 kU/L — ABNORMAL HIGH (ref <=114)

## 2020-01-04 NOTE — Telephone Encounter (Signed)
Crystal Ceo, NP  P [facility] Triage Pool Cc: Nyoka Cowden, MD Lab work today IgE level is elevated. This is the same result that was elevated 7 years ago. I would recommend keeping follow-up with Dr. Sherene Sires. Remain adherent to your inhalers. If you continue to have difficulty with your breathing or requiring additional steroid tapers will need to consider biologic medication such as Xolair. This can be discussed at your next office visit with Dr. Sherene Sires.   CBC with differential does show mild eosinophilia. Eosinophil count 300. Which is also elevated.  We will route results to Dr. Sherene Sires as Lorain Childes.   Crystal Headland, FNP   Attempted to call pt but unable to reach. Left message for her to return call.

## 2020-01-04 NOTE — Telephone Encounter (Signed)
Called and spoke with pt. Pt stated she was still needing to know the results of labwork as she said after OV with Arlys John, she went to the lab and has not been made aware of the results. Arlys John, please advise.

## 2020-01-05 NOTE — Telephone Encounter (Signed)
LMTCB x2 for pt 

## 2020-01-06 NOTE — Telephone Encounter (Signed)
Advised pt of results. Pt understood and nothing further is needed.   

## 2020-01-06 NOTE — Telephone Encounter (Signed)
LMTCB x3 for patient. We have attempted to contact patient several times with no success or call back from patient. Per triage protocol, message will be closed.

## 2020-01-15 ENCOUNTER — Other Ambulatory Visit: Payer: Self-pay | Admitting: Internal Medicine

## 2020-01-26 ENCOUNTER — Other Ambulatory Visit: Payer: Self-pay

## 2020-01-26 ENCOUNTER — Ambulatory Visit: Payer: Medicaid Other | Admitting: Primary Care

## 2020-01-26 ENCOUNTER — Encounter: Payer: Self-pay | Admitting: Primary Care

## 2020-01-26 VITALS — BP 112/72 | HR 75 | Temp 97.3°F | Ht 68.0 in | Wt 189.4 lb

## 2020-01-26 DIAGNOSIS — R079 Chest pain, unspecified: Secondary | ICD-10-CM | POA: Diagnosis not present

## 2020-01-26 DIAGNOSIS — J4540 Moderate persistent asthma, uncomplicated: Secondary | ICD-10-CM | POA: Diagnosis not present

## 2020-01-26 MED ORDER — MONTELUKAST SODIUM 10 MG PO TABS: 10.0000 mg | ORAL_TABLET | Freq: Every day | ORAL | 5 refills | Status: DC

## 2020-01-26 MED ORDER — FAMOTIDINE 20 MG PO TABS: 20.0000 mg | ORAL_TABLET | Freq: Every day | ORAL | 5 refills | Status: DC

## 2020-01-26 NOTE — Progress Notes (Signed)
@Patient  ID: , female    DOB: [birth year], 44 y.o.   MRN: [id number]  Chief Complaint  Patient presents with  . Follow-up    Asthma    Referring provider: No ref. provider found  HPI: 44 year old female, never smoked. PMH significant for moderate persistent asthma, hyper IgE syndrome, obesity. Patient of Dr. 55, last seen by pulmonary NP on 01/01/20. Maintained on Symbicort 160.   Previous [facility] pulmonary encounter: 01/01/2020  03/03/2020, NP  44 year old female never smoker followed in our office for asthma.  Patient was last seen telephonically in June/2021 by TP NP.  It was felt at that time that patient was at a stable interval of her asthma.  Patient contacted our office on 12/31/2019.  Reporting she was having acute worsening symptoms with her breathing.  She was scheduled for follow-up with our office on 01/01/2020.  Patient reports that she recently traveled to 03/03/2020.  She left her rescue inhaler there.  She reports that after traveling back from [place] she has had a cough with congestion with yellow mucus.  She has a previous history of significantly elevated IgE as well as multiple environmental allergens on her Rast panel for about 7 years ago.  One of her highest results being dog dander.  She currently has a daughter that lives with her.  Patient denies significant wheezing or cough.  She wanted to present to our office for evaluation due to the mucus production as well as the increase shortness of breath.  She remains adherent to her Symbicort 160.   01/26/2020- Interim hx Patient presents today for 1 month follow-up. Her asthma are generally well controlled on Symbicort. She does not have frequent exacerbations requiring oral steriods. Only time this year she was sick was in July when she had sinusitis symptoms which improved after completing Zpack/prednisone.  Denies cough or wheezing.    She did develop mid sternal chest pain over the weekend 3 days ago, her aunt gave her  pepcid and warm soda. Patient reported improvement with belching. The following day she got home and still had some chest discomfort when lying down at night. She does not have primary care provider or cardiologist.  She does not have active chest pain at this time.   TESTING:  - Spirometry 12/16/2017  FEV1 2.14 (74%)  Ratio 79 p am dulera 100 x 2  - FENO 12/16/2017  =   7 p dulera 100 x 2  - 10/22/2018   change to symbicort 160  2 bid and rx pred x 6 days - Allergy Profile 07/17/2012-total IgE 1144.9. Allergy Skin Test- 03/25/13- significant positive for grass weed and tree pollens dust mite, cat. Started allergy vaccine 03/2013> stopped around 2017     Allergies  Allergen Reactions  . Penicillins Swelling    Immunization History  Administered Date(s) Administered  . Influenza Split 03/25/2012  . Influenza,inj,Quad PF,6+ Mos 03/25/2013, 03/27/2016  . PFIZER SARS-COV-2 Vaccination 10/01/2019, 10/26/2019  . Pneumococcal Polysaccharide-23 03/25/2012    Past Medical History:  Diagnosis Date  . Asthma   . Depression    goes to [facility], quit taking meds  . HSV (herpes simplex virus) infection    last outbreak 2007    Tobacco History: Social History   Tobacco Use  Smoking Status Never Smoker  Smokeless Tobacco Never Used   Counseling given: Not Answered   Outpatient Medications Prior to Visit  Medication Sig Dispense Refill  . albuterol (PROAIR HFA) 108 (90  Base) MCG/ACT inhaler 2 puffs every 4 hours as needed only  if your can't catch your breath 18 g 1  . albuterol (PROVENTIL) (2.5 MG/3ML) 0.083% nebulizer solution Take 6 mLs (5 mg total) by nebulization every 6 (six) hours as needed for wheezing or shortness of breath. DX: J45.40 75 mL 3  . azithromycin (ZITHROMAX) 250 MG tablet 500mg  (two tablets) today, then 250mg  (1 tablet) for the next 4 days 6 tablet 0  . metroNIDAZOLE (FLAGYL) 500 MG tablet Take 1 tablet (500 mg total) by mouth 2 (two) times daily. 14 tablet 0  .  predniSONE (DELTASONE) 10 MG tablet Take 2 tablets (20mg  total) daily for the next 5 days. Take in the AM with food. 10 tablet 0  . SYMBICORT 160-4.5 MCG/ACT inhaler TAKE 2 PUFFS BY MOUTH TWICE A DAY 10.2 Inhaler 11   No facility-administered medications prior to visit.    Review of Systems  Review of Systems  Constitutional: Negative.   HENT: Negative.   Respiratory: Negative for cough, chest tightness and shortness of breath.   Cardiovascular: Positive for chest pain.    Physical Exam  BP 112/72 (BP Location: Left Arm, Cuff Size: Normal)   Pulse 75   Temp (!) 97.3 F (36.3 C) (Oral)   Ht 5\' 8"  (1.727 m)   Wt 189 lb 6.4 oz (85.9 kg)   SpO2 98%   BMI 28.80 kg/m  Physical Exam Constitutional:      Appearance: Normal appearance.  HENT:     Head: Normocephalic and atraumatic.  Cardiovascular:     Rate and Rhythm: Normal rate and regular rhythm.  Pulmonary:     Effort: Pulmonary effort is normal.     Breath sounds: Normal breath sounds.  Musculoskeletal:        General: Normal range of motion.  Skin:    General: Skin is warm and dry.  Neurological:     General: No focal deficit present.     Mental Status: She is alert and oriented to person, place, and time. Mental status is at baseline.  Psychiatric:        Mood and Affect: Mood normal.        Behavior: Behavior normal.        Thought Content: Thought content normal.        Judgment: Judgment normal.      Lab Results:  CBC    Component Value Date/Time   WBC 9.8 01/01/2020 1154   RBC 4.21 01/01/2020 1154   HGB 13.8 01/01/2020 1154   HGB 13.5 02/03/2018 1421   HCT 40.1 01/01/2020 1154   HCT 40.2 02/03/2018 1421   PLT 319.0 01/01/2020 1154   PLT 402 02/03/2018 1421   MCV 95.3 01/01/2020 1154   MCV 95 02/03/2018 1421   MCH 31.9 02/03/2018 1421   MCH 32.3 12/08/2017 2049   MCHC 34.3 01/01/2020 1154   RDW 13.1 01/01/2020 1154   RDW 13.7 02/03/2018 1421   LYMPHSABS 1.4 01/01/2020 1154   LYMPHSABS 2.0  02/03/2018 1421   MONOABS 0.7 01/01/2020 1154   EOSABS 0.3 01/01/2020 1154   EOSABS 0.2 02/03/2018 1421   BASOSABS 0.1 01/01/2020 1154   BASOSABS 0.0 02/03/2018 1421    BMET    Component Value Date/Time   NA 137 12/10/2014 0604   K 4.3 12/10/2014 0604   CL 106 12/10/2014 0604   CO2 23 12/10/2014 0604   GLUCOSE 94 12/10/2014 0604   BUN 9 12/10/2014 0604   CREATININE 0.79 12/10/2014  0604   CALCIUM 9.1 12/10/2014 0604   GFRNONAA >60 12/10/2014 0604   GFRAA >60 12/10/2014 0604    BNP    Component Value Date/Time   BNP 6.6 12/10/2014 0604    ProBNP No results found for: PROBNP  Imaging: No results found.   Assessment & Plan:   Moderate persistent asthma in adult without complication - Significantly positive allergen panel. IgE 2259, Eosinophils 300. Asthma symptoms remain well controlled on present treatment - Continue Symbicort 160 two puffs twice daily - Adding singulair 10mg  at bedtime   Chest pain - EKG today showed sinus rhythm, ST elevated/ repolarization variant. When compared to previous EKG from June 2016 this does not appear new. She is not having active chest pain today, appears non-cardiac in nature. Starting pepcid 20mg  at bedtime for suspected GERD. Patient instructed to notify office if chest pain re-occurs and would refer to Cardiology   July 2016, NP 01/26/2020

## 2020-01-26 NOTE — Patient Instructions (Addendum)
Recomemndations: - Continue Symbicort two puffs morning/evening - Start Pepcid 20mg  at bedtime every night (chest pain likely related to acid reflux) - Restart Singulair 10mg  at bedtime (allergy symptoms and elevated markers)  Orders: - EKG re: chest pain  Follow-up: - 6 months with Dr. 

## 2020-01-26 NOTE — Assessment & Plan Note (Addendum)
-   Significantly positive allergen panel. IgE 2259, Eosinophils 300. Asthma symptoms remain well controlled on present treatment - Continue Symbicort 160 two puffs twice daily - Adding singulair 10mg  at bedtime

## 2020-01-26 NOTE — Assessment & Plan Note (Signed)
-   EKG today showed sinus rhythm, ST elevated/ repolarization variant. When compared to previous EKG from June 2016 this does not appear new. She is not having active chest pain today, appears non-cardiac in nature. Starting pepcid 20mg  at bedtime for suspected GERD. Patient instructed to notify office if chest pain re-occurs and would refer to Cardiology

## 2020-02-17 ENCOUNTER — Ambulatory Visit (INDEPENDENT_AMBULATORY_CARE_PROVIDER_SITE_OTHER): Payer: Medicaid Other

## 2020-02-17 ENCOUNTER — Ambulatory Visit (INDEPENDENT_AMBULATORY_CARE_PROVIDER_SITE_OTHER): Payer: Medicaid Other | Admitting: Primary Care

## 2020-02-17 ENCOUNTER — Encounter: Payer: Self-pay | Admitting: Primary Care

## 2020-02-17 ENCOUNTER — Other Ambulatory Visit: Payer: Self-pay

## 2020-02-17 VITALS — BP 132/82 | HR 74 | Temp 97.3°F | Ht 65.0 in | Wt 189.0 lb

## 2020-02-17 DIAGNOSIS — R0781 Pleurodynia: Secondary | ICD-10-CM | POA: Diagnosis not present

## 2020-02-17 DIAGNOSIS — M549 Dorsalgia, unspecified: Secondary | ICD-10-CM | POA: Diagnosis not present

## 2020-02-17 DIAGNOSIS — I270 Primary pulmonary hypertension: Secondary | ICD-10-CM | POA: Diagnosis not present

## 2020-02-17 DIAGNOSIS — Z7689 Persons encountering health services in other specified circumstances: Secondary | ICD-10-CM

## 2020-02-17 LAB — D-DIMER, QUANTITATIVE: D-Dimer, Quant: 0.29 mcg/mL FEU (ref <0.50)

## 2020-02-17 MED ORDER — ALBUTEROL SULFATE (2.5 MG/3ML) 0.083% IN NEBU: 5.0000 mg | INHALATION_SOLUTION | Freq: Four times a day (QID) | RESPIRATORY_TRACT | 3 refills | Status: DC | PRN

## 2020-02-17 MED ORDER — CYCLOBENZAPRINE HCL 5 MG PO TABS: 5.0000 mg | ORAL_TABLET | Freq: Three times a day (TID) | ORAL | 0 refills | Status: DC | PRN

## 2020-02-17 MED ORDER — ALBUTEROL SULFATE HFA 108 (90 BASE) MCG/ACT IN AERS: INHALATION_SPRAY | RESPIRATORY_TRACT | 1 refills | Status: DC

## 2020-02-17 MED ORDER — IBUPROFEN 800 MG PO TABS: 800.0000 mg | ORAL_TABLET | Freq: Three times a day (TID) | ORAL | 0 refills | Status: DC | PRN

## 2020-02-17 NOTE — Patient Instructions (Addendum)
Back pain appears musculoskeletal in nature. CXR was normal. Recommend 800mg  ibuprofen (with food) and muscle relaxer three times a day. Warm heat. If not better present to ED.   Orders: Labs (D-dimer, bmet)- already ordered Stat chest x-ray for pleuritic pain- already ordered  Referral: Internal medicine  Rx: Flexeril 5mg  three times a day as needed for back pain/muscle spasm (1 week supply, taper amount after 1-2 days. Do not drive or drink alcohol white taking medication)  Follow-up: 9/13 with Dr. 

## 2020-02-17 NOTE — Assessment & Plan Note (Signed)
-   Left upper back pain appears musculoskeletal in nature. Reproducible with abduction left arm. CXR was normal. Checking D-dimer, if elevated needs CTA to rule out PE. Unfortunately we were unable to do an EKG in office today d/t equipment malfunction, low suspicion for acute coronary syndrome. EKG three weeks ago was reassuring. If pain does not improve with Ibuprofen, Flexeril TID prn muscle spasm and heat then recommend patient present to ED.   - Orders: Labs (D-dimer, bmet); Stat chest x-ray  - Referral: Internal medicine - Rx: Ibuprofen 800mg  TID with food and Flexeril 5mg  three times a day as needed for back pain/muscle spasm (1 week supply, taper amount after 1-2 days. Do not drive or drink alcohol white taking medication) - Follow-up: 9/13 with Dr. 
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
## 2020-02-18 LAB — BASIC METABOLIC PANEL
BUN: 9 mg/dL (ref 6–23)
CO2: 29 mEq/L (ref 19–32)
Calcium: 9.5 mg/dL (ref 8.4–10.5)
Chloride: 103 mEq/L (ref 96–112)
Creatinine, Ser: 0.79 mg/dL (ref 0.40–1.20)
GFR: 96 mL/min (ref >60.00)
Glucose, Bld: 82 mg/dL (ref 70–99)
Potassium: 4.4 mEq/L (ref 3.5–5.1)
Sodium: 138 mEq/L (ref 135–145)

## 2020-02-19 NOTE — Progress Notes (Signed)
Please let patient know labs were normal, D-dimer was negative.

## 2020-02-24 ENCOUNTER — Other Ambulatory Visit (HOSPITAL_COMMUNITY)
Admission: RE | Admit: 2020-02-24 | Discharge: 2020-02-24 | Disposition: A | Discharge status: Home / Self Care | Payer: Medicaid Other | Source: Ambulatory Visit | Attending: Obstetrics | Admitting: Obstetrics

## 2020-02-24 ENCOUNTER — Other Ambulatory Visit: Payer: Self-pay

## 2020-02-24 ENCOUNTER — Encounter: Payer: Self-pay | Admitting: Obstetrics

## 2020-02-24 ENCOUNTER — Ambulatory Visit (INDEPENDENT_AMBULATORY_CARE_PROVIDER_SITE_OTHER): Payer: Medicaid Other | Admitting: Obstetrics

## 2020-02-24 VITALS — BP 120/78 | HR 92 | Wt 193.0 lb

## 2020-02-24 DIAGNOSIS — N898 Other specified noninflammatory disorders of vagina: Secondary | ICD-10-CM | POA: Diagnosis present

## 2020-02-24 DIAGNOSIS — Z124 Encounter for screening for malignant neoplasm of cervix: Secondary | ICD-10-CM

## 2020-02-24 DIAGNOSIS — N912 Amenorrhea, unspecified: Secondary | ICD-10-CM

## 2020-02-24 DIAGNOSIS — Z01419 Encounter for gynecological examination (general) (routine) without abnormal findings: Secondary | ICD-10-CM

## 2020-02-24 LAB — POCT URINE PREGNANCY: Preg Test, Ur: NEGATIVE

## 2020-02-24 NOTE — Addendum Note (Signed)
Addended by: Kennon Portela on: 02/24/2020 11:29 AM   Modules accepted: Orders

## 2020-02-24 NOTE — Progress Notes (Signed)
RGYN patient presents for problem visit today.  LMP: 10 days late pt took at home UPT both were Negative.   Last week in July pt had unprotected intercourse.   CC: Possible yeast infection.whits discharge , no itching . Pt notes having muscle spasms on left side and lower pelvic discomfort.

## 2020-02-24 NOTE — Progress Notes (Signed)
Patient ID: Crystal Sampson, female   DOB: [birth year], 44 y.o.   MRN: [id number]  Chief Complaint  Patient presents with  . Amenorrhea  . Vaginitis    HPI Crystal Sampson is a 44 y.o. female.  Complains of white vaginal discharge.  Denies vaginal itching, odor or pelvic pain.  She is 10 days late for period. HPI  Past Medical History:  Diagnosis Date  . Asthma   . Depression    goes to [facility], quit taking meds  . 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 NO PAST SURGERIES    . WISDOM TOOTH EXTRACTION Bilateral     Family History  Problem Relation Age of Onset  . Glaucoma Mother   . Hypertension Father   . Other Neg Hx     Social History Social History   Tobacco Use  . Smoking status: Never Smoker  . Smokeless tobacco: Never Used  Substance Use Topics  . Alcohol use: No  . Drug use: No    Allergies  Allergen Reactions  . Penicillins Swelling    Current Outpatient Medications  Medication Sig Dispense Refill  . albuterol (PROAIR HFA) 108 (90 Base) MCG/ACT inhaler 2 puffs every 4 hours as needed only  if your can't catch your breath 18 g 1  . albuterol (PROVENTIL) (2.5 MG/3ML) 0.083% nebulizer solution Take 6 mLs (5 mg total) by nebulization every 6 (six) hours as needed for wheezing or shortness of breath. DX: J45.40 75 mL 3  . ibuprofen (ADVIL) 800 MG tablet Take 1 tablet (800 mg total) by mouth every 8 (eight) hours as needed. 21 tablet 0  . SYMBICORT 160-4.5 MCG/ACT inhaler TAKE 2 PUFFS BY MOUTH TWICE A DAY 10.2 Inhaler 11  . azithromycin (ZITHROMAX) 250 MG tablet 500mg  (two tablets) today, then 250mg  (1 tablet) for the next 4 days (Patient not taking: Reported on 02/24/2020) 6 tablet 0  . cyclobenzaprine (FLEXERIL) 5 MG tablet Take 1 tablet (5 mg total) by mouth 3 (three) times daily as needed for muscle spasms. (Patient not taking: Reported on 02/24/2020) 21 tablet 0  . famotidine (PEPCID)  20 MG tablet Take 1 tablet (20 mg total) by mouth at bedtime. (Patient not taking: Reported on 02/24/2020) 30 tablet 5  . metroNIDAZOLE (FLAGYL) 500 MG tablet Take 1 tablet (500 mg total) by mouth 2 (two) times daily. (Patient not taking: Reported on 02/24/2020) 14 tablet 0  . montelukast (SINGULAIR) 10 MG tablet Take 1 tablet (10 mg total) by mouth at bedtime. (Patient not taking: Reported on 02/24/2020) 30 tablet 5  . predniSONE (DELTASONE) 10 MG tablet Take 2 tablets (20mg  total) daily for the next 5 days. Take in the AM with food. (Patient not taking: Reported on 02/24/2020) 10 tablet 0   No current facility-administered medications for this visit.    Review of Systems Review of Systems Constitutional: negative for fatigue and weight loss Respiratory: negative for cough and wheezing Cardiovascular: negative for chest pain, fatigue and palpitations Gastrointestinal: negative for abdominal pain and change in bowel habits Genitourinary: positive for vaginal discharge Integument/breast: negative for nipple discharge Musculoskeletal:negative for myalgias Neurological: negative for gait problems and tremors Behavioral/Psych: negative for abusive relationship, depression Endocrine: negative for temperature intolerance      Blood pressure 120/78, pulse 92, weight 193 lb (87.5 kg).  Physical Exam Physical Exam           General:  Alert and no distress Abdomen:  normal findings: no organomegaly, soft, non-tender and no hernia  Pelvis:  External genitalia: normal general appearance Urinary system: urethral meatus normal and bladder without fullness, nontender Vaginal: normal without tenderness, induration or masses Cervix: normal appearance Adnexa: normal bimanual exam Uterus: anteverted and non-tender, normal size    50% of 15 min visit spent on counseling and coordination of care.   Data Reviewed Wet Prep  Assessment       1. Encounter for routine gynecological examination with  Papanicolaou smear of cervix  2. Vaginal discharge Rx: - Cervicovaginal ancillary only( )  Plan   Follow up prn    Brock Bad, MD 02/24/2020 10:18 AM

## 2020-02-24 NOTE — Addendum Note (Signed)
Addended by: Kennon Portela on: 02/24/2020 01:17 PM   Modules accepted: Orders

## 2020-02-25 ENCOUNTER — Other Ambulatory Visit: Payer: Self-pay | Admitting: Obstetrics

## 2020-02-25 DIAGNOSIS — B3731 Acute candidiasis of vulva and vagina: Secondary | ICD-10-CM

## 2020-02-25 LAB — CERVICOVAGINAL ANCILLARY ONLY
Bacterial Vaginitis (gardnerella): NEGATIVE
Candida Glabrata: NEGATIVE
Candida Vaginitis: POSITIVE — AB
Chlamydia: NEGATIVE
Comment: NEGATIVE
Comment: NEGATIVE
Comment: NEGATIVE
Comment: NEGATIVE
Comment: NEGATIVE
Comment: NORMAL
Neisseria Gonorrhea: NEGATIVE
Trichomonas: NEGATIVE

## 2020-02-25 LAB — CYTOLOGY - PAP
Comment: NEGATIVE
Diagnosis: NEGATIVE
High risk HPV: NEGATIVE

## 2020-02-25 MED ORDER — FLUCONAZOLE 150 MG PO TABS: 150.0000 mg | ORAL_TABLET | Freq: Once | ORAL | 0 refills | Status: AC

## 2020-03-01 ENCOUNTER — Emergency Department (HOSPITAL_COMMUNITY)
Admission: EM | Admit: 2020-03-01 | Discharge: 2020-03-01 | Disposition: A | Discharge status: Home / Self Care | Payer: Medicaid Other | Attending: Emergency Medicine | Admitting: Emergency Medicine

## 2020-03-01 ENCOUNTER — Encounter (HOSPITAL_COMMUNITY): Payer: Self-pay

## 2020-03-01 ENCOUNTER — Emergency Department (HOSPITAL_COMMUNITY): Payer: Medicaid Other

## 2020-03-01 ENCOUNTER — Other Ambulatory Visit: Payer: Self-pay

## 2020-03-01 DIAGNOSIS — J45909 Unspecified asthma, uncomplicated: Secondary | ICD-10-CM | POA: Diagnosis not present

## 2020-03-01 DIAGNOSIS — Z79899 Other long term (current) drug therapy: Secondary | ICD-10-CM | POA: Insufficient documentation

## 2020-03-01 DIAGNOSIS — Y939 Activity, unspecified: Secondary | ICD-10-CM | POA: Diagnosis not present

## 2020-03-01 DIAGNOSIS — S40022A Contusion of left upper arm, initial encounter: Secondary | ICD-10-CM | POA: Diagnosis not present

## 2020-03-01 DIAGNOSIS — Y999 Unspecified external cause status: Secondary | ICD-10-CM | POA: Diagnosis not present

## 2020-03-01 DIAGNOSIS — Z7951 Long term (current) use of inhaled steroids: Secondary | ICD-10-CM | POA: Insufficient documentation

## 2020-03-01 DIAGNOSIS — S40021A Contusion of right upper arm, initial encounter: Secondary | ICD-10-CM | POA: Diagnosis present

## 2020-03-01 DIAGNOSIS — Y929 Unspecified place or not applicable: Secondary | ICD-10-CM | POA: Diagnosis not present

## 2020-03-01 MED ORDER — KETOROLAC TROMETHAMINE 30 MG/ML IJ SOLN
30.0000 mg | Freq: Once | INTRAMUSCULAR | Status: AC
  Administered 2020-03-01: 30 mg via INTRAMUSCULAR
  Filled 2020-03-01: qty 1

## 2020-03-01 MED ORDER — IBUPROFEN 800 MG PO TABS: 800.0000 mg | ORAL_TABLET | Freq: Three times a day (TID) | ORAL | 0 refills | Status: DC | PRN
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
## 2020-03-01 NOTE — ED Notes (Signed)
Patient verbalizes understanding of discharge instructions. Opportunity for questioning and answers were provided. Armband removed by staff, pt discharged from ED.  
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
## 2020-03-07 ENCOUNTER — Ambulatory Visit: Payer: Medicaid Other | Admitting: Internal Medicine

## 2020-07-22 ENCOUNTER — Encounter: Payer: Self-pay | Admitting: Adult Health

## 2020-07-22 ENCOUNTER — Ambulatory Visit (INDEPENDENT_AMBULATORY_CARE_PROVIDER_SITE_OTHER): Payer: Medicaid Other | Admitting: Adult Health

## 2020-07-22 ENCOUNTER — Other Ambulatory Visit: Payer: Self-pay

## 2020-07-22 DIAGNOSIS — J4540 Moderate persistent asthma, uncomplicated: Secondary | ICD-10-CM

## 2020-07-22 DIAGNOSIS — J3089 Other allergic rhinitis: Secondary | ICD-10-CM

## 2020-07-22 DIAGNOSIS — J302 Other seasonal allergic rhinitis: Secondary | ICD-10-CM

## 2020-07-22 MED ORDER — PREDNISONE 10 MG PO TABS: ORAL_TABLET | ORAL | 0 refills | Status: DC

## 2020-07-22 NOTE — Patient Instructions (Addendum)
Prednisone taper over next week  Continue on Symbicort 2 puffs twice daily, rinse after use Albuterol inhaler as needed Activity as tolerated Follow-up in 3-4  months with Dr. Sherene Sires and as needed Please contact office for sooner follow up if symptoms do not improve or worsen or seek emergency care

## 2020-07-22 NOTE — Progress Notes (Signed)
@Patient  ID: , female    DOB: [birth year], 45 y.o.   MRN: [id number]  Chief Complaint  Patient presents with  . Follow-up  . Asthma    Referring provider: No ref. provider found  HPI: 45 year old female never smoker.  Followed for moderate persistent asthma, hyper IgE syndrome, allergic rhinitis  TEST/EVENTS :  56 2001: FEV1 2.38 (68%), ratio 80. AP 03/20/2004-total IgE 1977.5 with broad elevation of almost all allergens and extremely high level against dog dander 03/22/2004 04/2012: FEV1 1.72 (58%), ratio 68 No response to singulair.  - changed advair to BREO 12/15/2014>changed to Dulera 100 due to insurance restrictions 07/08/2015 Allergy Profile 07/17/2012-total IgE 1144.9 with elevation of all potential allergens on the panel Allergy Skin Test- 03/25/13- significant positive for grass weed and tree pollens dust mite, cat  07/22/2020 Follow up: Asthma  Patient presents for a follow-up visit.  Patient has underlying allergic asthma.  Is on Symbicort twice daily.  Patient complains over the last week that she has had increased wheezing and tightness.  Minimum dry cough.  No fever no discolored mucus no loss of taste or smell.  She has been vaccinated for COVID-19 x2.  Has not got her booster yet.  Patient education given on Covid booster. Patient has had increased her albuterol use over the last week.  Patient feels that it is due to the weather change and being around hairspray chemicals.    Allergies  Allergen Reactions  . Penicillins Swelling    Immunization History  Administered Date(s) Administered  . Influenza Split 03/25/2012  . Influenza,inj,Quad PF,6+ Mos 03/25/2013, 03/27/2016  . PFIZER(Purple Top)SARS-COV-2 Vaccination 10/01/2019, 10/26/2019  . Pneumococcal Polysaccharide-23 03/25/2012    Past Medical History:  Diagnosis Date  . Asthma   . Depression    goes to [facility], quit taking meds  . HSV (herpes simplex virus) infection    last outbreak  2007    Tobacco History: Social History   Tobacco Use  Smoking Status Never Smoker  Smokeless Tobacco Never Used   Counseling given: Not Answered   Outpatient Medications Prior to Visit  Medication Sig Dispense Refill  . albuterol (PROAIR HFA) 108 (90 Base) MCG/ACT inhaler 2 puffs every 4 hours as needed only  if your can't catch your breath 18 g 1  . albuterol (PROVENTIL) (2.5 MG/3ML) 0.083% nebulizer solution Take 6 mLs (5 mg total) by nebulization every 6 (six) hours as needed for wheezing or shortness of breath. DX: J45.40 75 mL 3  . ibuprofen (ADVIL) 800 MG tablet Take 1 tablet (800 mg total) by mouth every 8 (eight) hours as needed. 21 tablet 0  . SYMBICORT 160-4.5 MCG/ACT inhaler TAKE 2 PUFFS BY MOUTH TWICE A DAY 10.2 Inhaler 11  . azithromycin (ZITHROMAX) 250 MG tablet 500mg  (two tablets) today, then 250mg  (1 tablet) for the next 4 days (Patient not taking: No sig reported) 6 tablet 0  . cyclobenzaprine (FLEXERIL) 5 MG tablet Take 1 tablet (5 mg total) by mouth 3 (three) times daily as needed for muscle spasms. (Patient not taking: No sig reported) 21 tablet 0  . famotidine (PEPCID) 20 MG tablet Take 1 tablet (20 mg total) by mouth at bedtime. (Patient not taking: No sig reported) 30 tablet 5  . metroNIDAZOLE (FLAGYL) 500 MG tablet Take 1 tablet (500 mg total) by mouth 2 (two) times daily. (Patient not taking: No sig reported) 14 tablet 0  . montelukast (SINGULAIR) 10 MG tablet Take 1 tablet (10 mg total) by  mouth at bedtime. (Patient not taking: No sig reported) 30 tablet 5  . predniSONE (DELTASONE) 10 MG tablet Take 2 tablets (20mg  total) daily for the next 5 days. Take in the AM with food. (Patient not taking: No sig reported) 10 tablet 0   No facility-administered medications prior to visit.     Review of Systems:   Constitutional:   No  weight loss, night sweats,  Fevers, chills, fatigue, or  lassitude.  HEENT:   No headaches,  Difficulty swallowing,  Tooth/dental  problems, or  Sore throat,                No sneezing, itching, ear ache,  +nasal congestion, post nasal drip,   CV:  No chest pain,  Orthopnea, PND, swelling in lower extremities, anasarca, dizziness, palpitations, syncope.   GI  No heartburn, indigestion, abdominal pain, nausea, vomiting, diarrhea, change in bowel habits, loss of appetite, bloody stools.   Resp:    No chest wall deformity  Skin: no rash or lesions.  GU: no dysuria, change in color of urine, no urgency or frequency.  No flank pain, no hematuria   MS:  No joint pain or swelling.  No decreased range of motion.  No back pain.    Physical Exam  BP 100/60 (BP Location: Left Arm, Patient Position: Sitting, Cuff Size: Large)   Pulse 92   Temp (!) 97 F (36.1 C) (Temporal)   Ht 5\' 8"  (1.727 m)   Wt 199 lb 9.6 oz (90.5 kg)   SpO2 99%   BMI 30.35 kg/m   GEN: A/Ox3; pleasant , NAD, well nourished    HEENT:  [place]/AT,  , NOSE-clear, THROAT-clear, no lesions, no postnasal drip or exudate noted.   NECK:  Supple w/ fair ROM; no JVD; normal carotid impulses w/o bruits; no thyromegaly or nodules palpated; no lymphadenopathy.    RESP  Clear  P & A; w/o, wheezes/ rales/ or rhonchi. no accessory muscle use, no dullness to percussion  CARD:  RRR, no m/r/g, no peripheral edema, pulses intact, no cyanosis or clubbing.  GI:   Soft & nt; nml bowel sounds; no organomegaly or masses detected.   Musco: Warm bil, no deformities or joint swelling noted.   Neuro: alert, no focal deficits noted.    Skin: Warm, no lesions or rashes    Lab Results:  CBC    ProBNP No results found for: PROBNP  Imaging: No results found.    No flowsheet data found.  Lab Results  Component Value Date   NITRICOXIDE 7 12/16/2017        Assessment & Plan:   Moderate persistent asthma in adult without complication Mild asthma flare.  We will treat with a short course of prednisone. Continue on maintenance medication  Plan   Patient Instructions  Prednisone taper over next week  Continue on Symbicort 2 puffs twice daily, rinse after use Albuterol inhaler as needed Activity as tolerated Follow-up in 3-4  months with Dr. and as needed Please contact office for sooner follow up if symptoms do not improve or worsen or seek emergency care       Seasonal and perennial allergic rhinitis Continue on current regimen     12/18/2017, NP 07/22/2020

## 2020-07-22 NOTE — Assessment & Plan Note (Signed)
Continue on current regimen .   

## 2020-07-22 NOTE — Assessment & Plan Note (Signed)
Mild asthma flare.  We will treat with a short course of prednisone. Continue on maintenance medication  Plan  Patient Instructions  Prednisone taper over next week  Continue on Symbicort 2 puffs twice daily, rinse after use Albuterol inhaler as needed Activity as tolerated Follow-up in 3-4  months with Dr. Sherene Sires and as needed Please contact office for sooner follow up if symptoms do not improve or worsen or seek emergency care

## 2020-10-20 ENCOUNTER — Ambulatory Visit: Payer: Medicaid Other | Admitting: Internal Medicine

## 2020-10-21 ENCOUNTER — Encounter: Payer: Self-pay | Admitting: Internal Medicine

## 2020-10-21 ENCOUNTER — Ambulatory Visit (INDEPENDENT_AMBULATORY_CARE_PROVIDER_SITE_OTHER): Payer: Medicaid Other | Admitting: Internal Medicine

## 2020-10-21 ENCOUNTER — Other Ambulatory Visit: Payer: Self-pay

## 2020-10-21 DIAGNOSIS — J4540 Moderate persistent asthma, uncomplicated: Secondary | ICD-10-CM | POA: Diagnosis not present

## 2020-10-21 DIAGNOSIS — E6609 Other obesity due to excess calories: Secondary | ICD-10-CM

## 2020-10-21 DIAGNOSIS — D824 Hyperimmunoglobulin E [IgE] syndrome: Secondary | ICD-10-CM | POA: Diagnosis not present

## 2020-10-21 LAB — CBC WITH DIFFERENTIAL/PLATELET
Basophils Absolute: 0.1 10*3/uL (ref 0.0–0.1)
Basophils Relative: 0.7 % (ref 0.0–3.0)
Eosinophils Absolute: 0.3 10*3/uL (ref 0.0–0.7)
Eosinophils Relative: 2.8 % (ref 0.0–5.0)
HCT: 38.7 % (ref 36.0–46.0)
Hemoglobin: 13.3 g/dL (ref 12.0–15.0)
Lymphocytes Relative: 26.3 % (ref 12.0–46.0)
Lymphs Abs: 2.3 10*3/uL (ref 0.7–4.0)
MCHC: 34.4 g/dL (ref 30.0–36.0)
MCV: 94.6 fl (ref 78.0–100.0)
Monocytes Absolute: 0.7 10*3/uL (ref 0.1–1.0)
Monocytes Relative: 7.7 % (ref 3.0–12.0)
Neutro Abs: 5.5 10*3/uL (ref 1.4–7.7)
Neutrophils Relative %: 62.5 % (ref 43.0–77.0)
Platelets: 347 10*3/uL (ref 150.0–400.0)
RBC: 4.09 Mil/uL (ref 3.87–5.11)
RDW: 12.8 % (ref 11.5–15.5)
WBC: 8.9 10*3/uL (ref 4.0–10.5)

## 2020-10-21 LAB — TSH: TSH: 1.18 u[IU]/mL (ref 0.35–4.50)

## 2020-10-21 MED ORDER — MONTELUKAST SODIUM 10 MG PO TABS: 10.0000 mg | ORAL_TABLET | Freq: Every day | ORAL | 11 refills | Status: DC

## 2020-10-21 NOTE — Assessment & Plan Note (Addendum)
Onset childhood Spiro 2001: FEV1 2.38 (68%), ratio 80. AP 03/20/2004  IgE 1877, RAST very abnormal 2007. Aspergillus CF <1:8 Arlyce Harman 04/2012:  FEV1 1.72 (58%), ratio 68 No response to singulair.  Allergy Skin Test- 03/25/13- significant positive for grass weed and tree pollens dust mite, cat - changed advair to BREO 12/15/2014 > changed to [facility] 100 due to insurance restrictions 07/08/2015  - 04/17/2017  extensive coaching HFA effectiveness =    75%  From a baseline of 50% - Spirometry 12/16/2017  FEV1 2.14 (74%)  Ratio 79 p am dulera 100 x 2  - FENO 12/16/2017  =   7 p dulera 100 x 2  - 12/16/2017  After extensive coaching inhaler device  effectiveness =    75% and 6 weeks iup > change to symb  80 2bid   - spring 2020 new dog in house  - 10/22/2018   change to symbicort 160  2 bid and rx pred x 6 days  - 03/11/2019  After extensive coaching inhaler device,  effectiveness =  75%    (short ti)  - 10/21/2020  After extensive coaching inhaler device,  effectiveness =  50% (late trigger)  - 10/21/2020 added singulair  -Allergy profile 10/21/2020 >  Eos 0. /  IgE pending   All goals of chronic asthma control not quite met yet  including optimal function and elimination of symptoms but more than optiimal  need for rescue therapy> add singulair, keep working on better hfa technique.  Contingencies discussed in full including contacting this office immediately if not controlling the symptoms using the rule of two's.            Each maintenance medication was reviewed in detail including emphasizing most importantly the difference between maintenance and prns and under what circumstances the prns are to be triggered using an action plan format where appropriate.  Total time for H and P, chart review, counseling, reviewing hfa  device(s) and generating customized AVS unique to this office visit / same day charting = 46mn

## 2020-10-21 NOTE — Patient Instructions (Signed)
Continue symbicort 160 Take 2 puffs first thing in am and then another 2 puffs about 12 hours later.    and add singulair 10 mg each pm    Please remember to go to the lab department   for your tests - we will call you with the results when they are available.      Weight control is simply a matter of calorie balance which needs to be tilted in your favor by eating less and exercising more.  To get the most out of exercise, you need to be continuously aware that you are short of breath, but never out of breath, for 30 minutes daily. As you improve, it will actually be easier for you to do the same amount of exercise  in  30 minutes so always push to the level where you are short of breath.  If this does not result in gradual weight reduction then I strongly recommend you see a nutritionist with a food diary x 2 weeks so that we can work out a negative calorie balance which is universally effective in steady weight loss programs.  Think of your calorie balance like you do your bank account where in this case you want the balance to go down so you must take in less calories than you burn up.  It's just that simple:  Hard to do, but easy to understand.  Good luck!    Please schedule a follow up visit in 3 months but call sooner if needed

## 2020-10-21 NOTE — Assessment & Plan Note (Addendum)
Body mass index is 30.29 kg/m.  -  trending up  Lab Results  Component Value Date   TSH 1.18 10/21/2020     Contributing to gerd risk/ doe/reviewed the need and the process to achieve and maintain neg calorie balance > defer f/u primary care including intermittently monitoring thyroid status

## 2020-10-21 NOTE — Progress Notes (Addendum)
  ID: Crystal Sampson, female    DOB: [birth year] .   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  AP 03/20/2004-total IgE 1977.5 with broad elevation of almost all allergens and extremely high level against dog dander [name] 04/2012:  FEV1 1.72 (58%), ratio 68 No response to singulair.  - changed advair to [facility] 12/15/2014 > changed to Dulera 100 due to insurance restrictions 07/08/2015  Allergy Profile 07/17/2012-total IgE 1144.9 with elevation of all potential allergens on the panel Allergy Skin Test- 03/25/13- significant positive for grass weed and tree pollens dust mite, cat   07/17/2016 follow-up asthma. NP  Patient returns for a three-month follow-up. Patient says she is doing well with her asthma. She denies any flare of wheezing or cough. Patient does get Dulera through patient assistance program. She says that she will be changing her insurance soon and would like her patient assistance papers filled back out. She does complain of some nasal drip and stuffiness intermittently. She's been using some over-the-counter nasal sprays. Denies any fever or discolored mucus, chest pain, orthopnea, PND, or increased leg swelling. She denies any increased use of albuterol. rec Continue on Dulera 2 puffs Twice daily  , rinse after use.  Saline nasal rinses As needed   Begin Flonase 1 puff Twice daily     02/14/2017  f/u ov/Crystal Sampson re: asthma/ atopic  Chief Complaint  Patient presents with  . Follow-up    Breathing is overall doing well. She rarely uses albuterol inhaler or neb.    despite very poor and inconsistent hfa technique no daytime or noct symptoms or need for hfa rec Plan A = Automatic =  Dulera 100 Take 2 puffs first thing in am and then another 2 puffs about 12 hours later.  Work on inhaler technique:   Plan B =  Backup Only use your albuterol (ventolin)  Plan C = Crisis - only use your albuterol nebulizer if you first try Plan B and it fails to help > ok to use the nebulizer up to every 4 hours but if start needing it regularly call for immediate appointment   04/17/2017 acute extended ov/Crystal Sampson re: atopic asthma with cough flare Chief Complaint  Patient presents with  . Acute Visit    Increased cough and congestion x 2 wks. She states cough is waking her up in the night and she is coughing so hard she is vomiting. She is coughing up green sputum.  She is using her albuterol inhaler 3-4 x per wk, and has not needed neb.   fine 2 weeks prior to OV  On just on dulera 100 Take 2 puffs first thing in am and then another 2 puffs about 12 hours later.  Getting dulera directly from [employer] contact 2 week prior to OV  > sev days later developed  sore throat dry cough / rx mucinex  Worse at hs than daytime s nasal congestion / Mucus light yellow turning to green  No rescue in hand but never takes more than one a day and could not tell me what the written action plan has for saba use  rec For cough > mucinex dm 1200 mg every 12 hours and supplement with tramadol 50 mg every 4 hours as needed Work on inhaler technique:  Prednisone 10 mg take  4 each am x 2 days,   2 each am x 2 days,  1 each am x 2 days and stop  Doxycycline  100 mg twice twice daily x 10 days  If the cough continues >   You will need to add Try prilosec otc 20mg   Take 30-60 min before first meal of the day and Pepcid ac (famotidine) 20 mg one @  bedtime until cough is completely gone for at least a week without the need for cough suppression GERD diet  Keep your follow up appt to see me in November but call sooner if needed. > did not return as req    12/16/2017 acute extended ov/Crystal Sampson re: sob in pt with asthma on dulera 100 2bid  Chief Complaint  Patient presents with  . Acute Visit    Increased SOB due to stress x 2 wks. She states occ  feels some pressure in her chest. She is using her albuterol inhaler about every other day and she has used neb x 1 in the past 2 wks.   more chest tight/sob x 2 weeks no better either saba or neb  Waking up thru the night can't breath resolves s rx p few minutes s saba Mornings are not all that bad / uses dulera w/in 30 min of rising  Cough not an issue  Has medicaid due to 6 weeks IUP  - last 16 years prior to OV  Asthma poorly controlled during that time rec Plan A = Automatic = change to symbicort 80 Take 2 puffs first thing in am and then another 2 puffs about 12 hours later.  Work on inhaler technique:  Plan B = Backup Only use your albuterol as a rescue medication  Plan C = Crisis - only use your albuterol nebulizer if you first try Plan B  Please schedule a follow up office visit in 4 weeks, sooner if needed  with all medications /inhalers/ solutions in hand so we can verify exactly what you are taking. This includes all medications from all doctors and over the counters   10/22/2018  f/u ov/Crystal Sampson re:  ? 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 Acute Visit    Increased SOB over the past 2 months. She has been having panic attacks. She is out of her ventolin and is using her neb about once every other day.  Dyspnea:  Fine for months s any symbicort just rarely needing albuterol then indolent onset sob x 2 m prior to OV  And then restarted symbicort 80 / has new dog in house though allergy screen pos for dog Cough: none Sleeping:feels needing saba nightly either hfa or neb / sleeping flat /2-3 pillows under hob  SABA use: as above does better p noct otc allergy medication then wakes up and needs saba hfa or neb   rec Plan A = Automatic = symbicort 160 Take 2 puffs first thing in am and then another 2 puffs about 12 hours later.   Prednisone 10 mg take  4 each am x 2 days,   2 each am x 2 days,  1 each am x 2 days and stop  Work on inhaler technique:   Plan B = Backup Only use your albuterol inhaler Plan C = Crisis - only  use your albuterol nebulizer if you first try Plan B and it fails to help > ok to use the nebulizer up to every 4 hours but if start needing it regularly call for immediate appointment Please schedule a follow up office visit in 2 weeks    11/05/2018  f/u ov/Crystal Sampson re: asthma resolved on symb 160 2bid and pred p 6 days  Chief Complaint  Patient presents with  . Follow-up    Breathing has been better since the last visit. She has had some heartburn and belching.    Dyspnea:  Not limited by breathing from desired activities   Cough: no Sleeping: able to lie flat  SABA use: none needed  02: none New reflux x sev weeks, had it during pregnancy/ assoc lots of burping  tums no better  rec Ok to try prilosec otc  20  Mg  Take 30- 60 min before your first and last meals of the day x 2 weeks and call if not better for GI eval  GERD diet Please schedule a follow up visit in 3 months but call sooner if needed    02/09/2019  f/u ov/Crystal Sampson re:  Asthma maint on symb 160 2bid / no longer on gerd rx  Chief Complaint  Patient presents with  . Follow-up    Patient states that her breathing is doing alot better.   Dyspnea:  Outdoor biking including up some hills x 45 min qd  Cough: none  Sleeping: no resp symptoms SABA use: rarely  rec GERD rx   Ok to use prilosec for up 2 week but beyond that let if needed let me refer you to GI   No change in respiratory medications   03/11/2019  f/u ov/Crystal Sampson re: atopic asthma maint symb 160 2bid / no ppi  Chief Complaint  Patient presents with  . Follow-up    Went to [place] and had asthma flare 03/08/19- feeling better now.   TO ER 03/08/19 x 3 h rx p cat exp rx neb/po steroids > back to baseline Dyspnea:  Outdoor biking ok up to Cough: none  Sleeping: freq  awakening not due to cough /wheeze SABA use: none  - note did not use correctly during asthma flare and hfa suboptimal  rec Plan A = Automatic = symbicort 160 Take 2 puffs first thing in am and then another 2 puffs about 12 hours later.   Work on inhaler technique:   Plan B = Backup Only use your albuterol (ventolin) inhaler as a rescue medication   Plan C = Crisis - only use your albuterol nebulizer if you first try Plan B and it fails to help > ok to use the nebulizer up to every 4 hours but if start needing it regularly call for immediate appointment   06/12/2019  f/u ov/Crystal Sampson re: asthma atppic  Chief Complaint  Patient presents with  . Follow-up    3 month f/u Moderate persistent asthma in adult without complication. Patient stated breathing is at her Baseline no other issues .   Dyspnea:  Cycling fine  Cough: no Sleeping: no resp flat 3 pillows  SABA use: no recent saba  02: none  rec No change in medications  Work on inhaler technique Plan A = Automatic = Always=   symbicort 160 Take 2 puffs first thing in am and then another 2 puffs about 12 hours later.  Plan B = Backup (to supplement plan A, not to replace it) Only use your albuterol  inhaler as a rescue medication Plan C = Crisis (instead of Plan B but only if Plan B stops working) - only use your albuterol nebulizer if you first try Plan B and it fails to help Plan D = Deltasone  - if you start needing nebulizer or it doesn't work great, go ahead and take Prednisone 10 mg take  4 each am x 2 days,   2 each am x 2 days,  1 each am x 2 days and stop  Please schedule a follow up visit in 6 months but call sooner if needed    10/21/2020  f/u ov/Crystal Sampson re:  Asthma / atopic  Chief Complaint  Patient presents with  . Follow-up    Been stressed due to leaving apartment    Dyspnea:  No exercise/ steps and walking dog ok Cough: none  Sleeping: bed flat / adds pillows  SABA use: 5 x   per week 02: none  Covid status:   vax x 2     No obvious day to day or daytime variability or assoc excess/ purulent sputum or mucus plugs or hemoptysis or cp or chest tightness, subjective wheeze or overt sinus or hb symptoms.   Sleeping without nocturnal  or early am exacerbation  of respiratory  c/o's or need for noct saba. Also denies any obvious fluctuation of symptoms with weather or environmental changes or other aggravating or alleviating factors except as outlined above   No unusual exposure hx or h/o childhood pna  or knowledge of premature birth.  Current Allergies, Complete Past Medical History, Past Surgical History, Family History, and Social History were reviewed in [employer] record.  ROS  The following are not active complaints unless bolded Hoarseness, sore throat, dysphagia, dental problems, itching, sneezing,  nasal congestion or discharge of excess mucus or purulent secretions, ear ache,   fever, chills, sweats, unintended wt loss or wt gain, classically pleuritic or exertional cp,  orthopnea pnd or arm/hand swelling  or leg swelling, presyncope, palpitations, abdominal pain, anorexia, nausea, vomiting, diarrhea  or change in bowel habits or change in bladder habits, change in stools or change in urine, dysuria, hematuria,  rash, arthralgias, visual complaints, headache, numbness, weakness or ataxia or problems with walking or coordination,  change in mood= anxious or  memory.        Current Meds  Medication Sig  . albuterol (PROAIR HFA) 108 (90 Base) MCG/ACT inhaler 2 puffs every 4 hours as needed only  if your can't catch your breath  . albuterol (PROVENTIL) (2.5 MG/3ML) 0.083% nebulizer solution Take 6 mLs (5 mg total) by nebulization every 6 (six) hours as needed for wheezing or shortness of breath. DX: J45.40  . ibuprofen (ADVIL) 800 MG tablet Take 1 tablet (800 mg total) by mouth every 8 (eight) hours as needed.  . predniSONE (DELTASONE) 10 MG tablet 4 tabs for 2 days, then 3 tabs for 2  days, 2 tabs for 2 days, then 1 tab for 2 days, then stop  . SYMBICORT 160-4.5 MCG/ACT inhaler TAKE 2 PUFFS BY MOUTH TWICE A DAY          Physical Exam   10/21/2020      199 06/12/2019    169 03/11/2019       166  02/09/2019       155  11/05/2018       150  10/22/2018       154  01/13/2018  162  12/16/2017       166   04/17/2017    198   02/14/17 204 lb 12.8 oz (92.9 kg)  07/17/16 208 lb 3.2 oz (94.4 kg)  03/27/16 178 lb (80.7 kg)     Vital signs reviewed  10/21/2020  - Note at rest 02 sats  99% on RA   General appearance:    amb bf  With mild pseudowheeze only   HEENT : pt wearing mask not removed for exam due to covid -19 concerns.    NECK :  without JVD/Nodes/TM/ nl carotid upstrokes bilaterally   LUNGS: no acc muscle use,  Nl contour chest which is clear to A and P bilaterally without cough on insp or exp maneuvers   CV:  RRR  no s3 or murmur or increase in P2, and no edema   ABD:  soft and nontender with nl inspiratory excursion in the supine position. No bruits or organomegaly appreciated, bowel sounds nl  MS:  Nl gait/ ext warm without deformities, calf tenderness, cyanosis or clubbing No obvious joint restrictions   SKIN: warm and dry without lesions    NEURO:  alert, approp, nl sensorium with  no motor or cerebellar deficits apparent.       Labs ordered 10/21/2020  :  allergy profile   Lab Results  Component Value Date   TSH 1.18 10/21/2020        Assessment & Plan:

## 2020-10-24 LAB — IGE: IgE (Immunoglobulin E), Serum: 1859 kU/L — ABNORMAL HIGH (ref <=114)

## 2020-10-25 NOTE — Progress Notes (Signed)
Spoke with pt and notified of results per Dr. Wert. Pt verbalized understanding and denied any questions. 

## 2020-11-07 ENCOUNTER — Other Ambulatory Visit: Payer: Self-pay

## 2020-11-07 ENCOUNTER — Encounter: Payer: Self-pay | Admitting: Obstetrics

## 2020-11-07 ENCOUNTER — Ambulatory Visit (INDEPENDENT_AMBULATORY_CARE_PROVIDER_SITE_OTHER): Payer: Medicaid Other | Admitting: Obstetrics

## 2020-11-07 ENCOUNTER — Other Ambulatory Visit (HOSPITAL_COMMUNITY)
Admission: RE | Admit: 2020-11-07 | Discharge: 2020-11-07 | Disposition: A | Discharge status: Home / Self Care | Payer: Medicaid Other | Source: Ambulatory Visit | Attending: Obstetrics | Admitting: Obstetrics

## 2020-11-07 VITALS — BP 113/75 | HR 77 | Wt 198.0 lb

## 2020-11-07 DIAGNOSIS — N898 Other specified noninflammatory disorders of vagina: Secondary | ICD-10-CM | POA: Insufficient documentation

## 2020-11-07 NOTE — Progress Notes (Signed)
RGYN pt presents for problem visit today.   CC: vaginal discharge pt did try OTC 1 day treatment.pt states she wants full panel done on swab. Pt made aware insurance may not cover 100%.    LMP:10/28/20

## 2020-11-07 NOTE — Progress Notes (Signed)
Patient ID: Crystal Sampson, female   DOB: [birth year], 45 y.o.   MRN: [id number]  Chief Complaint  Patient presents with  . Vaginal Discharge    HPI Crystal Sampson is a 45 y.o. female.  Complains of vaginal discharge and irritation. HPI  Past Medical History:  Diagnosis Date  . Asthma   . Depression    goes to [facility], quit taking meds  . 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 NO PAST SURGERIES    . WISDOM TOOTH EXTRACTION Bilateral     Family History  Problem Relation Age of Onset  . Glaucoma Mother   . Hypertension Father   . Other Neg Hx     Social History Social History   Tobacco Use  . Smoking status: Never Smoker  . Smokeless tobacco: Never Used  Vaping Use  . Vaping Use: Never used  Substance Use Topics  . Alcohol use: No  . Drug use: No    Allergies  Allergen Reactions  . Penicillins Swelling    Current Outpatient Medications  Medication Sig Dispense Refill  . albuterol (PROAIR HFA) 108 (90 Base) MCG/ACT inhaler 2 puffs every 4 hours as needed only  if your can't catch your breath 18 g 1  . albuterol (PROVENTIL) (2.5 MG/3ML) 0.083% nebulizer solution Take 6 mLs (5 mg total) by nebulization every 6 (six) hours as needed for wheezing or shortness of breath. DX: J45.40 75 mL 3  . ibuprofen (ADVIL) 800 MG tablet Take 1 tablet (800 mg total) by mouth every 8 (eight) hours as needed. 21 tablet 0  . montelukast (SINGULAIR) 10 MG tablet Take 1 tablet (10 mg total) by mouth at bedtime. 30 tablet 11  . predniSONE (DELTASONE) 10 MG tablet 4 tabs for 2 days, then 3 tabs for 2 days, 2 tabs for 2 days, then 1 tab for 2 days, then stop 20 tablet 0  . SYMBICORT 160-4.5 MCG/ACT inhaler TAKE 2 PUFFS BY MOUTH TWICE A DAY 10.2 Inhaler 11   No current facility-administered medications for this visit.    Review of Systems Review of Systems Constitutional: negative for fatigue and weight  loss Respiratory: negative for cough and wheezing Cardiovascular: negative for chest pain, fatigue and palpitations Gastrointestinal: negative for abdominal pain and change in bowel habits Genitourinary: positive for vaginal discharge with irritation  Integument/breast: negative for nipple discharge Musculoskeletal:negative for myalgias Neurological: negative for gait problems and tremors Behavioral/Psych: negative for abusive relationship, depression Endocrine: negative for temperature intolerance      Blood pressure 113/75, pulse 77, weight 198 lb (89.8 kg), last menstrual period 10/28/2020.  Physical Exam Physical Exam General:   alert and no distress  Skin:   no rash or abnormalities  Lungs:   clear to auscultation bilaterally  Heart:   regular rate and rhythm, S1, S2 normal, no murmur, click, rub or gallop  Breasts:   not examined  Abdomen:  normal findings: no organomegaly, soft, non-tender and no hernia  Pelvis:  External genitalia: normal general appearance Urinary system: urethral meatus normal and bladder without fullness, nontender Vaginal: normal without tenderness, induration or masses Cervix: normal appearance Adnexa: normal bimanual exam Uterus: anteverted and non-tender, normal size    I have spent a total of 15 minutes of face-to-face time, excluding clinical staff time, reviewing notes and preparing to see patient, ordering tests and/or medications, and counseling the patient.   Data Reviewed  Wet Prep  Assessment     1. Vaginal discharge Rx: - Cervicovaginal ancillary only( [place])    Plan   Follow up prn  Brock Bad, MD 11/07/2020 3:52 PM

## 2020-11-08 LAB — CERVICOVAGINAL ANCILLARY ONLY
Bacterial Vaginitis (gardnerella): NEGATIVE
Candida Glabrata: NEGATIVE
Candida Vaginitis: NEGATIVE
Chlamydia: NEGATIVE
Comment: NEGATIVE
Comment: NEGATIVE
Comment: NEGATIVE
Comment: NEGATIVE
Comment: NEGATIVE
Comment: NORMAL
Neisseria Gonorrhea: NEGATIVE
Trichomonas: NEGATIVE

## 2021-01-04 ENCOUNTER — Encounter (HOSPITAL_BASED_OUTPATIENT_CLINIC_OR_DEPARTMENT_OTHER): Payer: Self-pay | Admitting: Emergency Medicine

## 2021-01-04 ENCOUNTER — Emergency Department (HOSPITAL_BASED_OUTPATIENT_CLINIC_OR_DEPARTMENT_OTHER)
Admission: EM | Admit: 2021-01-04 | Discharge: 2021-01-04 | Disposition: A | Discharge status: Home / Self Care | Payer: Medicaid Other | Attending: Emergency Medicine | Admitting: Emergency Medicine

## 2021-01-04 ENCOUNTER — Other Ambulatory Visit: Payer: Self-pay

## 2021-01-04 ENCOUNTER — Emergency Department (HOSPITAL_BASED_OUTPATIENT_CLINIC_OR_DEPARTMENT_OTHER): Payer: Medicaid Other

## 2021-01-04 DIAGNOSIS — J4540 Moderate persistent asthma, uncomplicated: Secondary | ICD-10-CM | POA: Diagnosis not present

## 2021-01-04 DIAGNOSIS — R0602 Shortness of breath: Secondary | ICD-10-CM | POA: Insufficient documentation

## 2021-01-04 DIAGNOSIS — R079 Chest pain, unspecified: Secondary | ICD-10-CM | POA: Diagnosis present

## 2021-01-04 DIAGNOSIS — Z7951 Long term (current) use of inhaled steroids: Secondary | ICD-10-CM | POA: Diagnosis not present

## 2021-01-04 DIAGNOSIS — R059 Cough, unspecified: Secondary | ICD-10-CM | POA: Diagnosis not present

## 2021-01-04 DIAGNOSIS — R0789 Other chest pain: Secondary | ICD-10-CM | POA: Diagnosis not present

## 2021-01-04 LAB — CBC WITH DIFFERENTIAL/PLATELET
Abs Immature Granulocytes: 0.02 10*3/uL (ref 0.00–0.07)
Basophils Absolute: 0.1 10*3/uL (ref 0.0–0.1)
Basophils Relative: 1 %
Eosinophils Absolute: 0.4 10*3/uL (ref 0.0–0.5)
Eosinophils Relative: 4 %
HCT: 36.3 % (ref 36.0–46.0)
Hemoglobin: 12.4 g/dL (ref 12.0–15.0)
Immature Granulocytes: 0 %
Lymphocytes Relative: 31 %
Lymphs Abs: 2.9 10*3/uL (ref 0.7–4.0)
MCH: 31.6 pg (ref 26.0–34.0)
MCHC: 34.2 g/dL (ref 30.0–36.0)
MCV: 92.6 fL (ref 80.0–100.0)
Monocytes Absolute: 0.8 10*3/uL (ref 0.1–1.0)
Monocytes Relative: 9 %
Neutro Abs: 5.1 10*3/uL (ref 1.7–7.7)
Neutrophils Relative %: 55 %
Platelets: 372 10*3/uL (ref 150–400)
RBC: 3.92 MIL/uL (ref 3.87–5.11)
RDW: 12.4 % (ref 11.5–15.5)
WBC: 9.3 10*3/uL (ref 4.0–10.5)
nRBC: 0 % (ref 0.0–0.2)

## 2021-01-04 LAB — BASIC METABOLIC PANEL
Anion gap: 8 (ref 5–15)
BUN: 9 mg/dL (ref 6–20)
CO2: 25 mmol/L (ref 22–32)
Calcium: 9.2 mg/dL (ref 8.9–10.3)
Chloride: 105 mmol/L (ref 98–111)
Creatinine, Ser: 0.7 mg/dL (ref 0.44–1.00)
GFR, Estimated: >60 mL/min (ref >60)
Glucose, Bld: 97 mg/dL (ref 70–99)
Potassium: 3.5 mmol/L (ref 3.5–5.1)
Sodium: 138 mmol/L (ref 135–145)

## 2021-01-04 LAB — TROPONIN I (HIGH SENSITIVITY): Troponin I (High Sensitivity): <2 ng/L (ref <18)

## 2021-01-04 MED ORDER — ASPIRIN 81 MG PO CHEW
324.0000 mg | CHEWABLE_TABLET | Freq: Once | ORAL | Status: AC
  Administered 2021-01-04: 324 mg via ORAL
  Filled 2021-01-04: qty 4

## 2021-01-04 NOTE — ED Provider Notes (Signed)
[facility] EMERGENCY DEPT Provider Note   CSN: [id number] Arrival date & time: 01/04/21  1835     History Chief Complaint  Patient presents with   Panic Attack    Crystal Sampson is a 45 y.o. female.  45 yo F with a chief complaints of chest pain.  This been off and on for the past couple months or so.  The patient is worried that there her rats in her apartment and she also is worried that there is mold in the air ducts.  She is turned the air conditioning off because she is worried that will make her sick.  She had seen her lung doctor at the onset of this who told her that her blood work showed increased signs of allergens.  She has been using her inhaler at home more frequently.  Denies overt wheezing has coughed off and on.  No fevers.  No sick contacts.  Started having chest pain as well worse with breathing twisting certain movements.  She denies history of PE or DVT denies hemoptysis denies unilateral lower extreme edema denies recent surgery or hospitalization or immobilization.  Denies estrogen use denies history of cancer.  The history is provided by the patient.  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    Patient Active Problem List   Diagnosis Date Noted   Upper back pain on left side 02/17/2020   GERD (gastroesophageal reflux disease) 11/09/2018   Abnormal pelvic ultrasound 02/14/2018   Anxiety 03/14/2017   Chest pain 12/15/2014   Obesity 12/15/2014   Seasonal and perennial allergic rhinitis  10/19/2012   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   Moderate persistent asthma in adult without complication 05/07/2012   Anxiety and depression 03/24/2012   Tachycardia 03/24/2012   Leukocytosis 03/24/2012   Noncompliance 03/24/2012    Past Surgical History:  Procedure Laterality Date   NO PAST SURGERIES     WISDOM TOOTH EXTRACTION Bilateral      OB History     Gravida  4   Para  2   Term  2   Preterm      AB  1   Living  2      SAB      IAB  1   Ectopic      Multiple      Live Births  2           Family History  Problem Relation Age of Onset   Glaucoma Mother    Hypertension Father    Other Neg Hx     Social History   Tobacco Use   Smoking status: Never   Smokeless tobacco: Never  Vaping Use   Vaping Use: Never used  Substance Use Topics   Alcohol use: No   Drug use: No    Home Medications Prior to Admission medications   Medication Sig Start Date End Date Taking? Authorizing Provider  albuterol (PROAIR HFA) 108 (90 Base) MCG/ACT inhaler 2 puffs every 4 hours as  needed only  if your can't catch your breath 02/17/20   Glenford Bayley, NP  albuterol (PROVENTIL) (2.5 MG/3ML) 0.083% nebulizer solution Take 6 mLs (5 mg total) by nebulization every 6 (six) hours as needed for wheezing or shortness of breath. DX: J45.40 02/17/20   Glenford Bayley, NP  ibuprofen (ADVIL) 800 MG tablet Take 1 tablet (800 mg total) by mouth every 8 (eight) hours as needed. 03/01/20   Jacalyn Lefevre, MD  montelukast (SINGULAIR) 10 MG tablet Take 1 tablet (10 mg total) by mouth at bedtime. 10/21/20   Nyoka Cowden, MD  predniSONE (DELTASONE) 10 MG tablet 4 tabs for 2 days, then 3 tabs for 2 days, 2 tabs for 2 days, then 1 tab for 2 days, then stop 07/22/20   Parrett, Virgel Bouquet, NP  SYMBICORT 160-4.5 MCG/ACT inhaler TAKE 2 PUFFS BY MOUTH TWICE A DAY 01/15/20   Nyoka Cowden, MD    Allergies    Penicillins  Review of Systems   Review of Systems  Constitutional:   Negative for chills and fever.  HENT:  Negative for congestion and rhinorrhea.   Eyes:  Negative for redness and visual disturbance.  Respiratory:  Positive for cough and shortness of breath. Negative for wheezing.   Cardiovascular:  Positive for chest pain. Negative for palpitations.  Gastrointestinal:  Negative for nausea and vomiting.  Genitourinary:  Negative for dysuria and urgency.  Musculoskeletal:  Negative for arthralgias and myalgias.  Skin:  Negative for pallor and wound.  Neurological:  Negative for dizziness and headaches.   Physical Exam Updated Vital Signs BP 123/79   Pulse 63   Temp 98.3 F (36.8 C) (Oral)   Resp 20   Ht 5\' 8"  (1.727 m)   Wt 89.8 kg   LMP 12/19/2020 (Approximate)   SpO2 99%   BMI 30.11 kg/m   Physical Exam Vitals and nursing note reviewed.  Constitutional:      General: She is not in acute distress.    Appearance: She is well-developed. She is not diaphoretic.  HENT:     Head: Normocephalic and atraumatic.  Eyes:     Pupils: Pupils are equal, round, and reactive to light.  Cardiovascular:     Rate and Rhythm: Normal rate and regular rhythm.     Heart sounds: No murmur heard.   No friction rub. No gallop.  Pulmonary:     Effort: Pulmonary effort is normal.     Breath sounds: No wheezing or rales.     Comments: Pain parasternally reproduces her symptoms. Chest:     Chest wall: Tenderness present.  Abdominal:     General: There is no distension.     Palpations: Abdomen is soft.     Tenderness: There is no abdominal tenderness.  Musculoskeletal:        General: No tenderness.     Cervical back: Normal range of motion and neck supple.  Skin:    General: Skin is warm and dry.  Neurological:     Mental Status: She is alert and oriented to person, place, and time.  Psychiatric:        Behavior: Behavior normal.    ED Results / Procedures / Treatments   Labs (all labs ordered are listed, but only abnormal results are  displayed) Labs Reviewed  CBC WITH DIFFERENTIAL/PLATELET  BASIC METABOLIC PANEL  TROPONIN I (HIGH SENSITIVITY)    EKG EKG Interpretation  Date/Time:  Wednesday January 04 2021 19:45:16 EDT Ventricular Rate:  72  PR Interval:  187 QRS Duration: 83 QT Interval:  375 QTC Calculation: 411 R Axis:   74 Text Interpretation: Sinus rhythm Baseline wander No significant change since last tracing Confirmed by Melene Plan [phone]) on 01/04/2021 9:04:06 PM  Radiology DG Chest Port 1 View  Result Date: 01/04/2021 CLINICAL DATA:  45 year old female with chest pain. EXAM: PORTABLE CHEST 1 VIEW COMPARISON:  Chest radiograph dated 02/17/2020. FINDINGS: The heart size and mediastinal contours are within normal limits. Both lungs are clear. The visualized skeletal structures are unremarkable. IMPRESSION: No active disease. Electronically Signed   By: Elgie Collard M.D.   On: 01/04/2021 19:56    Procedures Procedures   Medications Ordered in ED Medications  aspirin chewable tablet 324 mg (324 mg Oral Given 01/04/21 1942)    ED Course  I have reviewed the triage vital signs and the nursing notes.  Pertinent labs & imaging results that were available during my care of the patient were reviewed by me and considered in my medical decision making (see chart for details).    MDM Rules/Calculators/A&P                          45 yo F with a chief complaints of intermittent periods where she feels like she having chest pain and shortness of breath.  Off and on for a couple months.  She thinks it usually comes on when she is worried about going back to her apartment.  She is clear lung sounds for me.  Troponins negative EKG without concerning finding chest x-ray viewed by me without focal showed pneumothorax.  Likely musculoskeletal based on history and physical.  We will have her follow-up with her family doctor.  9:07 PM:  I have discussed the diagnosis/risks/treatment options with the patient and believe  the pt to be eligible for discharge home to follow-up with PCP. We also discussed returning to the ED immediately if new or worsening sx occur. We discussed the sx which are most concerning (e.g., sudden worsening pain, fever, inability to tolerate by mouth) that necessitate immediate return. Medications administered to the patient during their visit and any new prescriptions provided to the patient are listed below.  Medications given during this visit Medications  aspirin chewable tablet 324 mg (324 mg Oral Given 01/04/21 1942)     The patient appears reasonably screen and/or stabilized for discharge and I doubt any other medical condition or other [facility] requiring further screening, evaluation, or treatment in the ED at this time prior to discharge.   Final Clinical Impression(s) / ED Diagnoses Final diagnoses:  Nonspecific chest pain    Rx / DC Orders ED Discharge Orders     None        Melene Plan, DO 01/04/21 2107

## 2021-01-04 NOTE — Discharge Instructions (Addendum)
Take 4 over the counter ibuprofen tablets 3 times a day or 2 over-the-counter naproxen tablets twice a day for pain. Also take tylenol 1000mg (2 extra strength) four times a day.     Your lab work was not suggestive of a heart attack.  Your chest x-ray did not show a pneumonia or collapsed lung.  Most likely your pain is musculoskeletal based on your history and exam.  Please follow-up with your family doctor in the office.

## 2021-01-04 NOTE — ED Notes (Signed)
Pt also stated that she does not feel safe at her apartment because she can't take the apt organization on related to the mold in her apt.

## 2021-01-04 NOTE — ED Triage Notes (Signed)
Pt states that for the past 2 months Pt has had panic attacks off and on. Pt states that she has chest pain and difficulty breathing  associated with same.   Pt stated that she had a sever panic attack yesterday when she learned of mold, mildew  and other problems with her her apartment.   Pt states that her Chest currently hurts some when she takes in a deep breath.

## 2021-01-16 ENCOUNTER — Telehealth: Payer: Self-pay | Admitting: Internal Medicine

## 2021-01-16 NOTE — Telephone Encounter (Signed)
Type and printed requested letter. Placed in Dr. Thurston Hole sign folder. Spoke with pt and notified her that she could pick up letter tomorrow. Pt stated understanding. Nothing further needed at this time.

## 2021-01-16 NOTE — Telephone Encounter (Signed)
Continued... Pt stated she has a video on her facebook that shows the infestation and the severity of the problem.

## 2021-01-16 NOTE — Telephone Encounter (Signed)
Ok to write the letter the way she has requested

## 2021-01-16 NOTE — Telephone Encounter (Signed)
Dr. Sherene Sires, please advise if you are okay with writing a letter for pt to turn into the [facility].  Pt does have a f/u appt scheduled with you 7/29.

## 2021-01-17 ENCOUNTER — Telehealth: Payer: Self-pay | Admitting: Internal Medicine

## 2021-01-17 NOTE — Telephone Encounter (Signed)
I have called and spoke with pt and she is aware that she can pick the letter up tomorrow.  Nothing further is needed.

## 2021-01-18 IMAGING — DX DG CHEST 2V
2 series · 2 of 2 positions shown · non-contrast
Comparison: 03/14/2017

CLINICAL DATA: Pleuritic chest pain

EXAM:
CHEST - 2 VIEW

[chest pa]
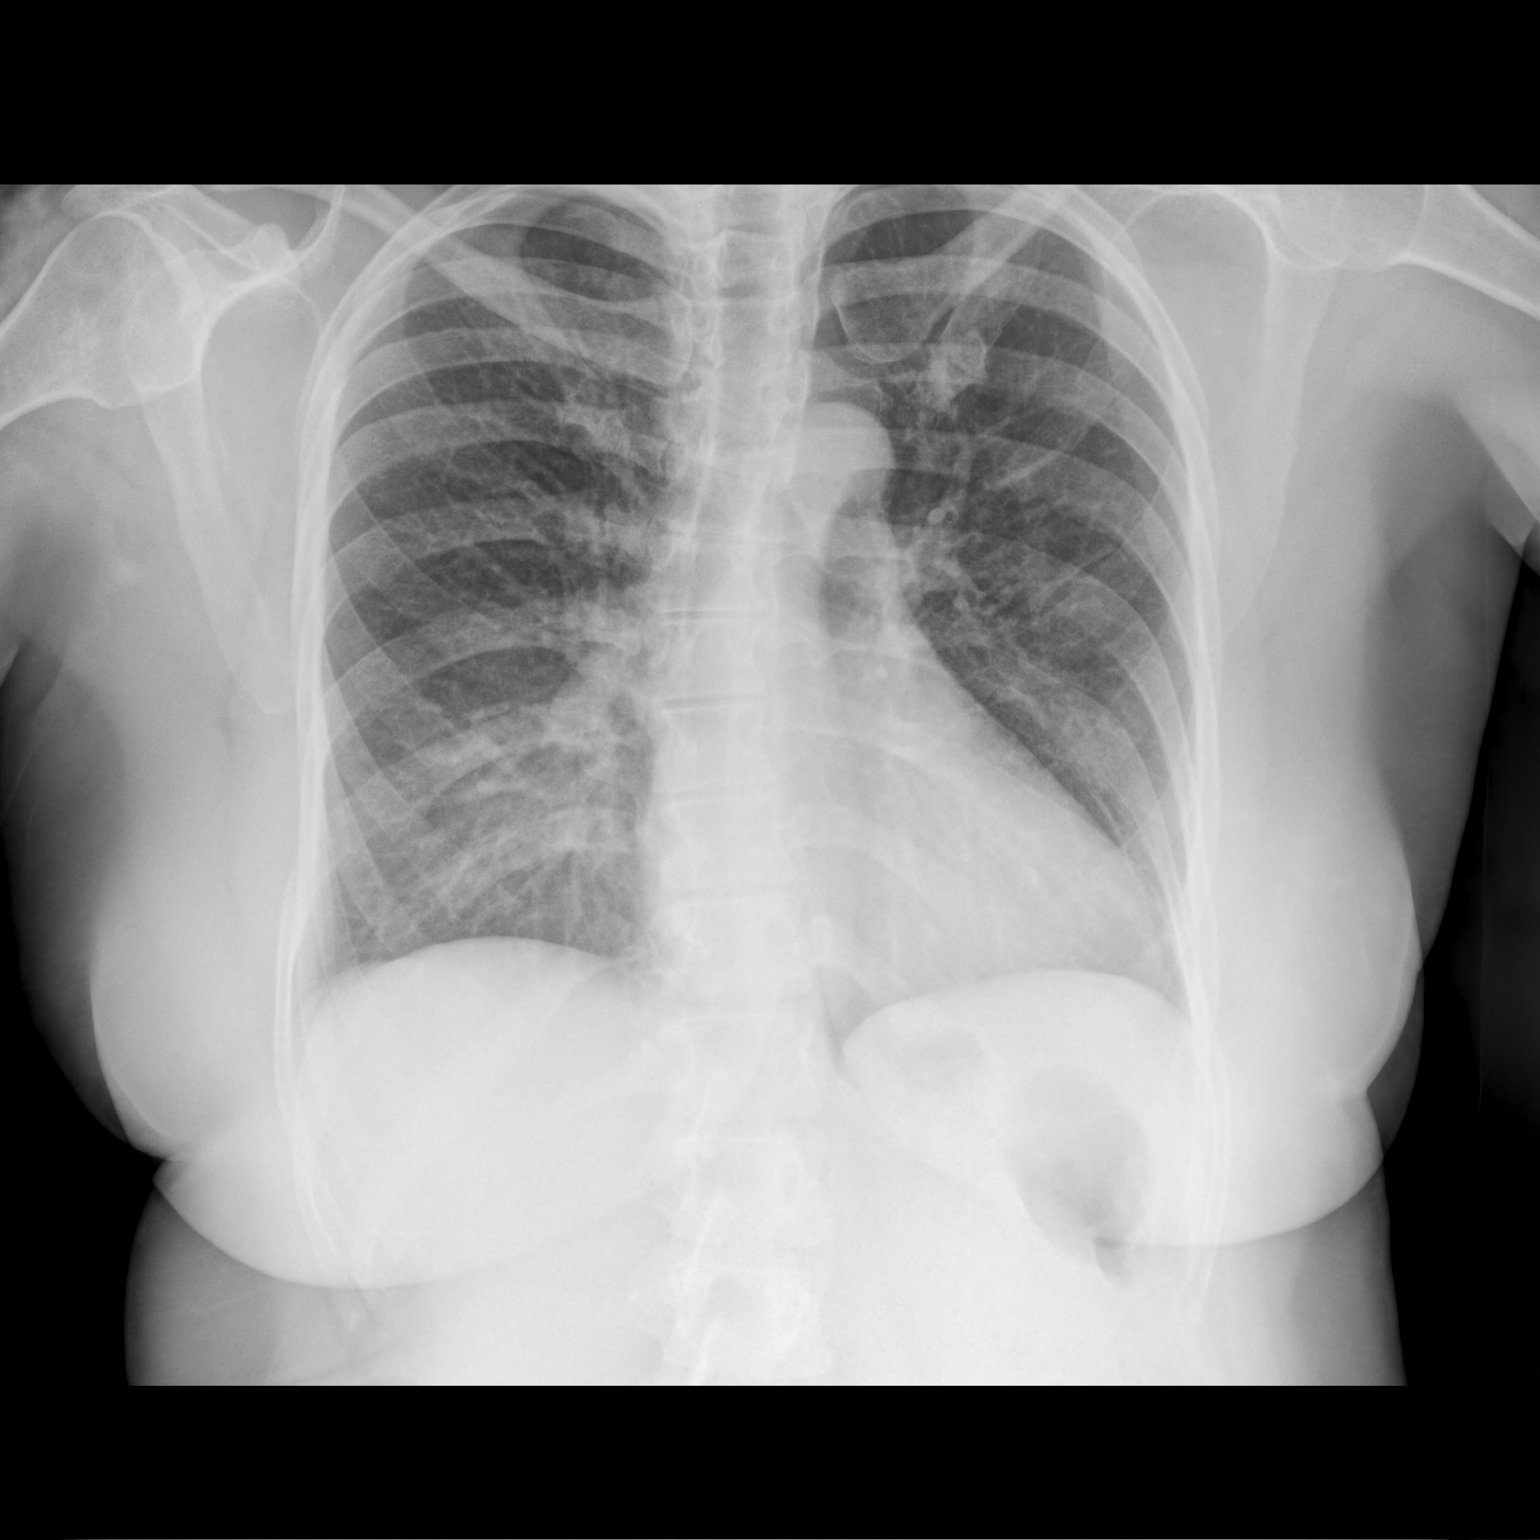

[chest lat]
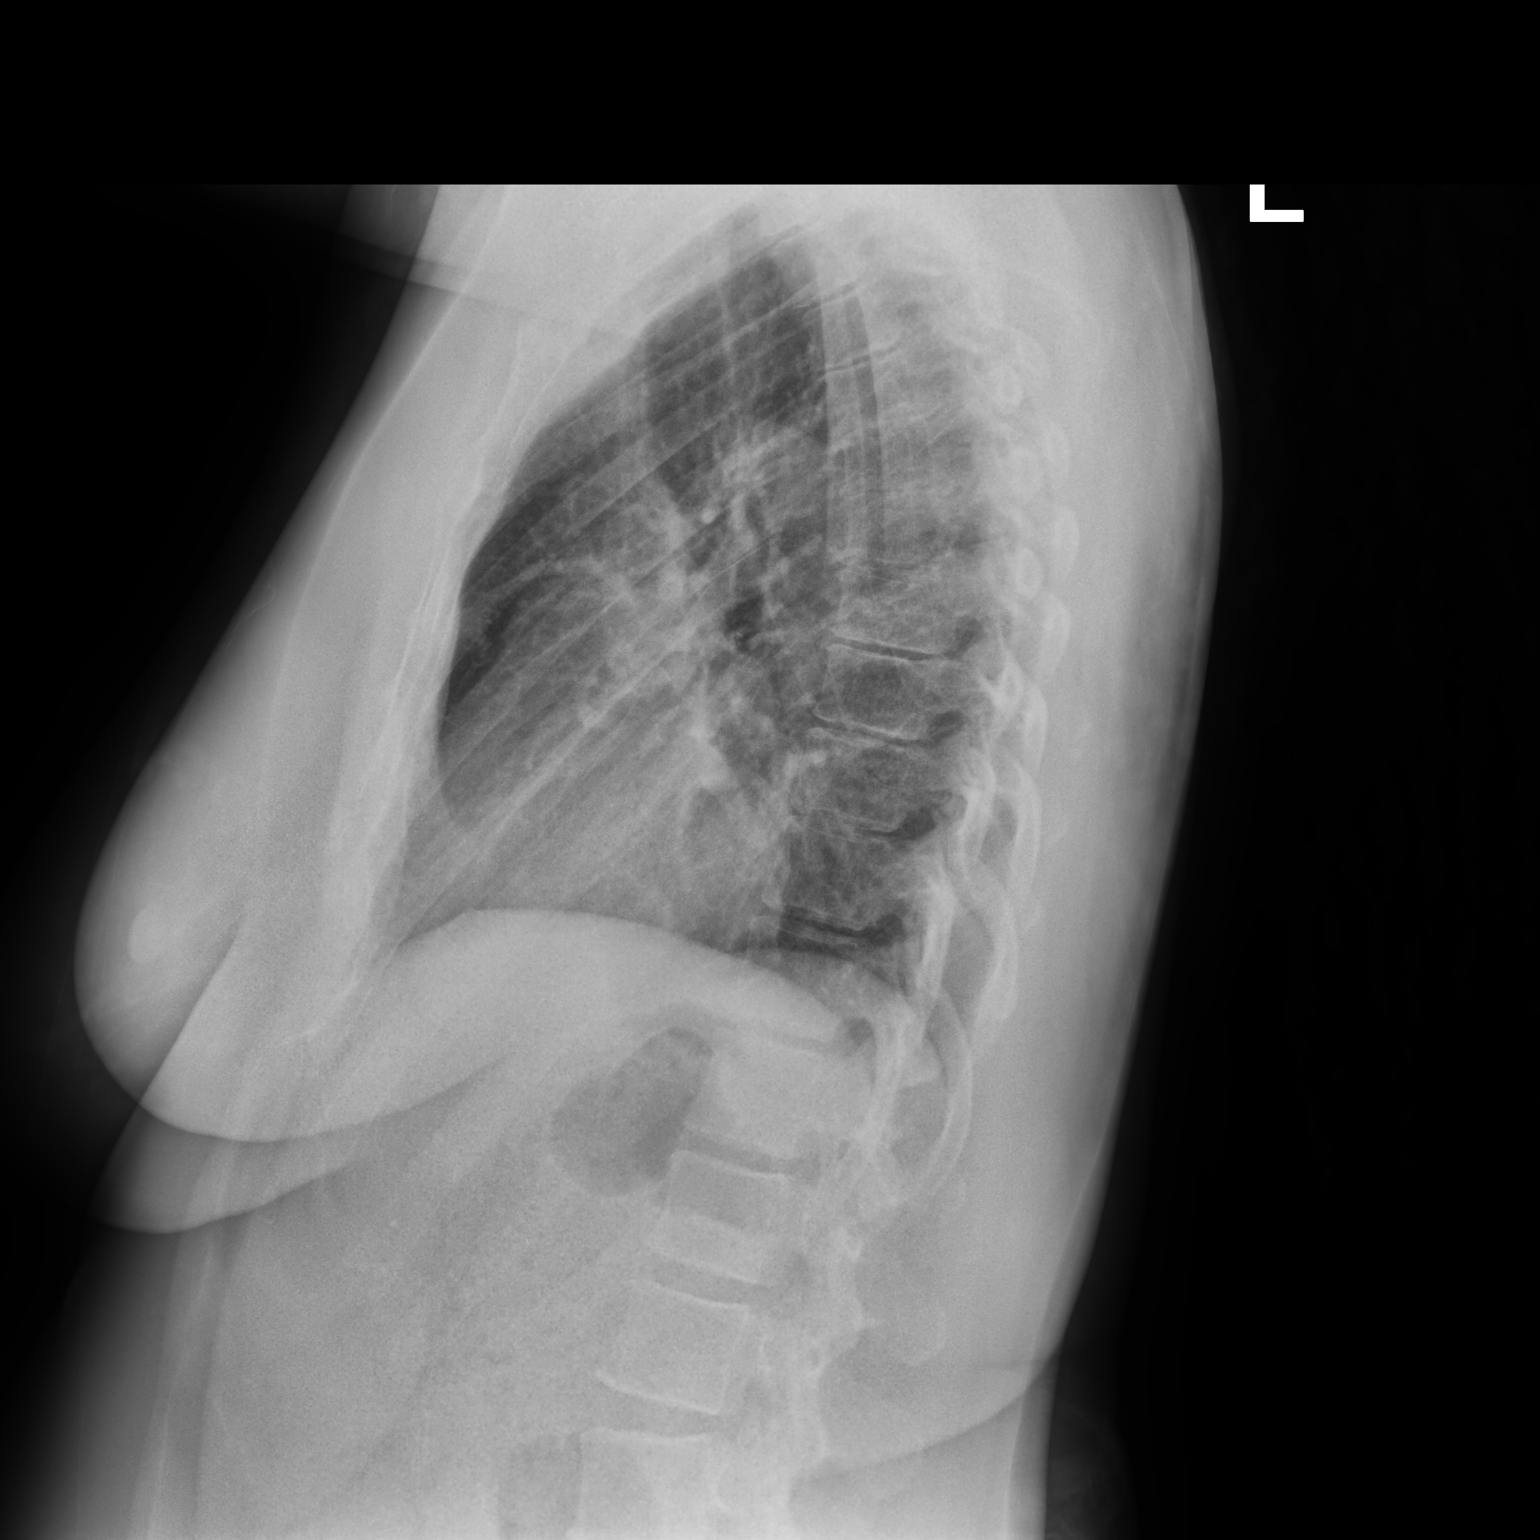

[2 of 2 positions shown; findings below may reference images not displayed]

FINDINGS: Cardiac shadow is within normal limits. The lungs are well aerated
bilaterally. No focal infiltrate or sizable effusion is seen. No
bony abnormality is noted.
IMPRESSION: No acute abnormality noted.

## 2021-01-20 ENCOUNTER — Encounter: Payer: Self-pay | Admitting: Internal Medicine

## 2021-01-20 ENCOUNTER — Other Ambulatory Visit: Payer: Self-pay

## 2021-01-20 ENCOUNTER — Ambulatory Visit (INDEPENDENT_AMBULATORY_CARE_PROVIDER_SITE_OTHER): Payer: Medicaid Other | Admitting: Internal Medicine

## 2021-01-20 DIAGNOSIS — J4540 Moderate persistent asthma, uncomplicated: Secondary | ICD-10-CM | POA: Diagnosis not present

## 2021-01-20 NOTE — Progress Notes (Signed)
@Patient  ID: , female    DOB: 1975/07/09 .   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  AP 03/20/2004-total IgE 1977.5 with broad elevation of almost all allergens and extremely high level against dog dander 03/22/2004 04/2012:  FEV1 1.72 (58%), ratio 68 No response to singulair.  - changed advair to [facility] 12/15/2014 > changed to Dulera 100 due to insurance restrictions 07/08/2015  Allergy Profile 07/17/2012-total IgE 1144.9 with elevation of all potential allergens on the panel Allergy Skin Test- 03/25/13- significant positive for grass weed and tree pollens dust mite, cat   07/17/2016 follow-up asthma. NP  Patient returns for a three-month follow-up. Patient says she is doing well with her asthma. She denies any flare of wheezing or cough. Patient does get Dulera through patient assistance program. She says that she will be changing her insurance soon and would like her patient assistance papers filled back out. She does complain of some nasal drip and stuffiness intermittently. She's been using some over-the-counter nasal sprays. Denies any fever or discolored mucus, chest pain, orthopnea, PND, or increased leg swelling. She denies any increased use of albuterol. rec Continue on Dulera 2 puffs Twice daily  , rinse after use.  Saline nasal rinses As needed   Begin Flonase 1 puff Twice daily     02/14/2017  f/u ov/Victoriano Campion re: asthma/ atopic  Chief Complaint  Patient presents with   Follow-up    Breathing is overall doing well. She rarely uses albuterol inhaler or neb.    despite very poor and inconsistent hfa technique no daytime or noct symptoms or need for hfa rec Plan A = Automatic =  Dulera 100 Take 2 puffs first thing in am and then another 2 puffs about 12 hours later.  Work on inhaler technique:   Plan B =  Backup Only use your albuterol (ventolin)  Plan C = Crisis - only use your albuterol nebulizer if you first try Plan B and it fails to help > ok to use the nebulizer up to every 4 hours but if start needing it regularly call for immediate appointment   04/17/2017 acute extended ov/Tamaka Sawin re: atopic asthma with cough flare Chief Complaint  Patient presents with   Acute Visit    Increased cough and congestion x 2 wks. She states cough is waking her up in the night and she is coughing so hard she is vomiting. She is coughing up green sputum.  She is using her albuterol inhaler 3-4 x per wk, and has not needed neb.   fine 2 weeks prior to OV  On just on dulera 100 Take 2 puffs first thing in am and then another 2 puffs about 12 hours later.  Getting dulera directly from 04/19/2017 contact 2 week prior to OV  > sev days later developed  sore throat dry cough / rx mucinex  Worse at hs than daytime s nasal congestion / Mucus light yellow turning to green  No rescue in hand but never takes more than one a day and could not tell me what the written action plan has for saba use  rec For cough > mucinex dm 1200 mg every 12 hours and supplement with tramadol 50 mg every 4 hours as needed Work on inhaler technique:  Prednisone 10 mg take  4 each am x 2 days,   2 each am x 2 days,  1 each am x 2 days and stop  Doxycycline  100 mg twice twice daily x 10 days  If the cough continues >   You will need to add Try prilosec otc 20mg   Take 30-60 min before first meal of the day and Pepcid ac (famotidine) 20 mg one @  bedtime until cough is completely gone for at least a week without the need for cough suppression GERD diet  Keep your follow up appt to see me in November but call sooner if needed. > did not return as req       10/22/2018  f/u ov/Mehlani Blankenburg re:  ? 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 She has been having panic attacks. She is out of her ventolin and is using her neb about once every other day.  Dyspnea:  Fine for months s any symbicort just rarely needing albuterol then indolent onset sob x 2 m prior to OV  And then restarted symbicort 80 / has new dog in house though allergy screen pos for dog Cough: none Sleeping:feels needing saba nightly either hfa or neb / sleeping flat /2-3 pillows under hob  SABA use: as above does better p noct otc allergy medication then wakes up and needs saba hfa or neb   rec Plan A = Automatic = symbicort 160 Take 2 puffs first thing in am and then another 2 puffs about 12 hours later.  Prednisone 10 mg take  4 each am x 2 days,   2 each am x 2 days,  1 each am x 2 days and stop  Work on inhaler technique:   Plan B = Backup Only use your albuterol inhaler Plan C = Crisis - only use your albuterol nebulizer if you first try Plan B and it fails to help > ok to use the nebulizer up to every 4 hours but if start needing it regularly call for immediate appointment Please schedule a follow up office visit in 2 weeks   10/21/2020  f/u ov/Valina Maes re:  Asthma / atopic  Chief Complaint  Patient presents with   Follow-up    Been stressed due to leaving apartment    Dyspnea:  No exercise/ steps and walking dog ok Cough: none  Sleeping: bed flat / adds pillows  SABA use: 5 x   per week 02: none  Covid status:   vax x 2  Rec Continue symbicort 160 and add singulair 10 mg each pm Please remember to go to the lab department   for your tests - we will call you with the results when they are available.  Weight control is a matter of calorie balance   01/20/2021  f/u ov/Tiberius Loftus re: asthma maint on symb/ singulair /  has no ac where she lieves Chief Complaint  Patient presents with   Follow-up    Hyper-IGE syndrome, She reports having some panic attacks and chest pains  and was checked out and not sure if this is related to her breathing.   Dyspnea:  no change / still walking dog  Cough: p hs and on off nightly Sleeping: bed is flat/  on side  SABA use: neb once or twice a week in addition to freq hfa  02: none  Covid status:   vax x 2  No ongoing cp's   No obvious day to day or daytime variability or assoc excess/ purulent sputum or mucus plugs or hemoptysis or  chest tightness, subjective wheeze or overt sinus or hb symptoms.   Sleeping now without nocturnal  or early am exacerbation  of respiratory  c/o's or need for noct saba. Also denies any obvious fluctuation of symptoms with weather or environmental changes or other aggravating or alleviating factors except as outlined above   No unusual exposure hx or h/o childhood pna/ asthma or knowledge of premature birth.  Current Allergies, Complete Past Medical History, Past Surgical History, Family History, and Social History were reviewed in [employer] record.  ROS  The following are not active complaints unless bolded Hoarseness, sore throat, dysphagia, dental problems, itching, sneezing,  nasal congestion or discharge of excess mucus or purulent secretions, ear ache,   fever, chills, sweats, unintended wt loss or wt gain, classically pleuritic or exertional cp,  orthopnea pnd or arm/hand swelling  or leg swelling, presyncope, palpitations, abdominal pain, anorexia, nausea, vomiting, diarrhea  or change in bowel habits or change in bladder habits, change in stools or change in urine, dysuria, hematuria,  rash, arthralgias, visual complaints, headache, numbness, weakness or ataxia or problems with walking or coordination,  change in mood or  memory.        Current Meds  Medication Sig   albuterol (PROAIR HFA) 108 (90 Base) MCG/ACT inhaler 2 puffs every 4 hours as needed only  if your can't catch your breath   albuterol (PROVENTIL) (2.5 MG/3ML) 0.083% nebulizer solution Take 6 mLs (5  mg total) by nebulization every 6 (six) hours as needed for wheezing or shortness of breath. DX: J45.40   ibuprofen (ADVIL) 800 MG tablet Take 1 tablet (800 mg total) by mouth every 8 (eight) hours as needed.   montelukast (SINGULAIR) 10 MG tablet Take 1 tablet (10 mg total) by mouth at bedtime.   predniSONE (DELTASONE) 10 MG tablet 4 tabs for 2 days, then 3 tabs for 2 days, 2 tabs for 2 days, then 1 tab for 2 days, then stop   SYMBICORT 160-4.5 MCG/ACT inhaler TAKE 2 PUFFS BY MOUTH TWICE A DAY                Physical Exam  01/20/2021      196   10/21/2020     199 06/12/2019    169 03/11/2019       166  02/09/2019       155  11/05/2018       150  10/22/2018       154  01/13/2018       162  12/16/2017       166   04/17/2017    198   02/14/17 204 lb 12.8 oz (92.9 kg)  07/17/16 208 lb 3.2 oz (94.4 kg)  03/27/16 178 lb (80.7 kg)      Vital signs reviewed  01/20/2021  - Note at rest 02 sats  98% on RA   General appearance:    obese pleasant amb bf nad    HEENT : pt wearing mask not removed for exam due to covid -19 concerns.    NECK :  without JVD/Nodes/TM/ nl carotid upstrokes bilaterally   LUNGS: no acc muscle use,  Nl contour chest  which is clear to A and P bilaterally without cough on insp or exp maneuvers   CV:  RRR  no s3 or murmur or increase in P2, and no edema   ABD:  soft and nontender with nl inspiratory excursion in the supine position. No bruits or organomegaly appreciated, bowel sounds nl  MS:  Nl gait/ ext warm without deformities, calf tenderness, cyanosis or clubbing No obvious joint restrictions   SKIN: warm and dry without lesions    NEURO:  alert, approp, nl sensorium with  no motor or cerebellar deficits apparent.          Assessment & Plan:

## 2021-01-20 NOTE — Patient Instructions (Signed)
We will be referring you to allergy - once you establish with allergy we can see you as needed   Work on inhaler technique:  relax and gently blow all the way out then take a nice smooth full deep breath back in, triggering the inhaler at same time you start breathing in.  Hold for up to 5 seconds if you can. Blow out thru nose. Rinse and gargle with water when done.  If mouth or throat bother you at all,  try brushing teeth/gums/tongue with arm and hammer toothpaste/ make a slurry and gargle and spit out.

## 2021-01-20 NOTE — Assessment & Plan Note (Addendum)
Onset childhood Crystal Sampson 2001: FEV1 2.38 (68%), ratio 80. AP 03/20/2004  IgE 1877, RAST very abnormal 2007. Aspergillus CF <1:8 Crystal Sampson 04/2012:  FEV1 1.72 (58%), ratio 68 No response to singulair.  Allergy Skin Test- 03/25/13- significant positive for grass weed and tree pollens dust mite, cat - changed advair to BREO 12/15/2014 > changed to [facility] 100 due to insurance restrictions 07/08/2015  - 04/17/2017  extensive coaching HFA effectiveness =    75%  From a baseline of 50% - Spirometry 12/16/2017  FEV1 2.14 (74%)  Ratio 79 p am dulera 100 x 2  - FENO 12/16/2017  =   7 p dulera 100 x 2  - 12/16/2017  After extensive coaching inhaler device  effectiveness =    75% and 6 weeks iup > change to symb  80 2bid   - spring 2020 new dog in house  - 10/22/2018   change to symbicort 160  2 bid and rx pred x 6 days  - 03/11/2019  After extensive coaching inhaler device,  effectiveness =  75%    (short ti)  - 10/21/2020  After extensive coaching inhaler device,  effectiveness =  50% (late trigger)  - 10/21/2020 added singulair  -Allergy profile 12/31/20 >  Eos 0.3 /  IgE 2259   - 01/20/2021  After extensive coaching inhaler device,  effectiveness =    60% (trigger was delayed and short ti)  - 01/20/2021 referred to allergy    Despite symbicort/singulair still needing hfa and neb saba with very atoptic studies above > allergy eval and rx approp and pulmonary f/u can then be prn   Discussed in detail all the  indications, usual  risks and alternatives  relative to the benefits with patient who agrees to proceed with Rx as outlined.             Each maintenance medication was reviewed in detail including emphasizing most importantly the difference between maintenance and prns and under what circumstances the prns are to be triggered using an action plan format where appropriate.  Total time for H and P, chart review, counseling, reviewing hfa device(s) and generating customized AVS unique to this office visit / same  day charting  > 30 min

## 2021-01-24 ENCOUNTER — Telehealth: Payer: Self-pay | Admitting: Internal Medicine

## 2021-01-24 MED ORDER — BUDESONIDE-FORMOTEROL FUMARATE 160-4.5 MCG/ACT IN AERO
INHALATION_SPRAY | RESPIRATORY_TRACT | 11 refills | Status: DC
Start: 2021-01-24 — End: 2021-11-13

## 2021-01-24 NOTE — Telephone Encounter (Signed)
Called and spoke with pt and she stated that her symbicort has expired and she needed a new rx sent to the pharmacy.  This has been done.  Nothing further is needed.

## 2021-02-13 ENCOUNTER — Encounter: Payer: Self-pay | Admitting: Internal Medicine

## 2021-02-13 ENCOUNTER — Other Ambulatory Visit: Payer: Self-pay

## 2021-02-13 ENCOUNTER — Ambulatory Visit (INDEPENDENT_AMBULATORY_CARE_PROVIDER_SITE_OTHER): Payer: Medicaid Other | Admitting: Internal Medicine

## 2021-02-13 VITALS — BP 122/80 | HR 81 | Temp 98.1°F | Resp 20 | Ht 66.75 in | Wt 199.4 lb

## 2021-02-13 DIAGNOSIS — J3089 Other allergic rhinitis: Secondary | ICD-10-CM

## 2021-02-13 DIAGNOSIS — J302 Other seasonal allergic rhinitis: Secondary | ICD-10-CM

## 2021-02-13 DIAGNOSIS — J4540 Moderate persistent asthma, uncomplicated: Secondary | ICD-10-CM

## 2021-02-13 DIAGNOSIS — J4550 Severe persistent asthma, uncomplicated: Secondary | ICD-10-CM | POA: Diagnosis not present

## 2021-02-13 NOTE — Patient Instructions (Addendum)
Severe Persistent Asthma: - your breathing test today didn't look so great - your allergy testing today was positive to most grasses, trees, weeds, molds, dust mites, pet dander, cockroach - Continue Symbicort 160 mg 2 puffs twice a day with a spacer; THIS SHOULD BE USED EVERY DAY - Rinse mouth out after use - Use Albuterol (Proair/Ventolin) 2 puffs every 4-6 hours as needed for chest tightness, wheezing, or coughing - Use Albuterol (Proair/Ventolin) 2 puffs 15 minutes prior to exercise if you have symptoms with activity - Use a spacer with all inhalers - please keep track of how often you are needing rescue inhaler Albuterol (Proair/Ventolin) as this will help guide future management - Asthma is not controlled if:  - Symptoms are occurring >2 times a week OR  - >2 times a month nighttime awakenings  - Please call the clinic to schedule a follow up if these symptoms arise - you would be a great candidate for an anti-eosinophilic biologic called Fasenra (benralizumab)  - I will start paperwork for this and someone should call you soon with information.  - recommend yearly flu vaccine  Seasonal and Perennial Allergic Rhinitis:  - consider daily antihistamine for better symptom control antihistamine Your options include: Zyrtec (cetirizine) 10 mg, Claritin (loratadine) 10 mg, Xyzal (levocetirizine) 5 mg or Allegra (fexofenadine) 180 mg daily as needed    DUST MITE AVOIDANCE MEASURES:  There are three main measures that need and can be taken to avoid house dust mites:  Reduce accumulation of dust in general -reduce furniture, clothing, carpeting, books, stuffed animals, especially in bedroom  Separate yourself from the dust -use pillow and mattress encasements (can be found at stores such as [employer] or online) -avoid direct exposure to air condition flow -use a HEPA filter device, especially in the bedroom; you can also use a HEPA filter vacuum cleaner -wipe dust with a  moist towel instead of a dry towel or broom when cleaning  Decrease mites and/or their secretions -wash clothing and linen and stuffed animals at highest temperature possible, at least every 2 weeks -stuffed animals can also be placed in a bag and put in a freezer overnight  Despite the above measures, it is impossible to eliminate dust mites or their allergen completely from your home.  With the above measures the burden of mites in your home can be diminished, with the goal of minimizing your allergic symptoms.  Success will be reached only when implementing and using all means together.  Control of Dog or Cat Allergen  Avoidance is the best way to manage a dog or cat allergy. If you have a dog or cat and are allergic to dog or cats, consider removing the dog or cat from the home. If you have a dog or cat but don't want to find it a new home, or if your family wants a pet even though someone in the household is allergic, here are some strategies that may help keep symptoms at bay:  Keep the pet out of your bedroom and restrict it to only a few rooms. Be advised that keeping the dog or cat in only one room will not limit the allergens to that room. Don't pet, hug or kiss the dog or cat; if you do, wash your hands with soap and water. High-efficiency particulate air (HEPA) cleaners run continuously in a bedroom or living room can reduce allergen levels over time. Regular use of a high-efficiency vacuum cleaner or a central vacuum can  reduce allergen levels. Giving your dog or cat a bath at least once a week can reduce airborne allergen.  Control of Mold Allergen   Mold and fungi can grow on a variety of surfaces provided certain temperature and moisture conditions exist.  Outdoor molds grow on plants, decaying vegetation and soil.  The major outdoor mold, Alternaria and Cladosporium, are found in very high numbers during hot and dry conditions.  Generally, a late Summer - Fall peak is seen for  common outdoor fungal spores.  Rain will temporarily lower outdoor mold spore count, but counts rise rapidly when the rainy period ends.  The most important indoor molds are Aspergillus and Penicillium.  Dark, humid and poorly ventilated basements are ideal sites for mold growth.  The next most common sites of mold growth are the bathroom and the kitchen.  Outdoor (Seasonal) Mold Control  Positive outdoor molds via skin testing:   Use air conditioning and keep windows closed Avoid exposure to decaying vegetation. Avoid leaf raking. Avoid grain handling. Consider wearing a face mask if working in moldy areas.    Indoor (Perennial) Mold Control   Positive indoor molds via skin testing:   Maintain humidity below 50%. Clean washable surfaces with 5% bleach solution. Remove sources e.g. contaminated carpets.  Control of Cockroach Allergen  Cockroach allergen has been identified as an important cause of acute attacks of asthma, especially in urban settings.  There are fifty-five species of cockroach that exist in the [place], however only three, the [place], [place] species produce allergen that can affect patients with Asthma.  Allergens can be obtained from fecal particles, egg casings and secretions from cockroaches.    Remove food sources. Reduce access to water. Seal access and entry points. Spray runways with 0.5-1% Diazinon or Chlorpyrifos Blow boric acid power under stoves and refrigerator. Place bait stations (hydramethylnon) at feeding sites.

## 2021-02-13 NOTE — Progress Notes (Signed)
**Note Crystal-Identified via Obfuscation** ,  NEW PATIENT Date of Service/Encounter:  02/13/21 Referring provider: Nyoka Sampson, Crystal B, MD Primary care provider: de Sampson, Crystal J, MD  Subjective:  No chief complaint on file.  Crystal Sampson is a 45 y.o. female with a PMHx of moderate persistent asthma, allergic rhinoconjunctivitis, obesity, GERD, anxiety and depression presenting today for evaluation of moderate persistent asthma History obtained from: chart review and patient.   Severe Persistent Asthma: Diagnosed in childhood.  Current symptoms include chest tightness ,cough, shortness of breath, wheezing Every other day daytime symptoms in past month, nightly nighttime awakenings in past month, using rescue inhaler multiple times per day,  Limitations to daily activity: some-going up and down stairs causes symptoms, but is walking dog daily  Estimates 3 ED visits, 0 UC visits, and estimates 1 course of oral steroids in the past year 5-7 number of lifetime hospitalizations for asthma, 0 number of lifetime intubations.  Identified Triggers:  pollen  She recently moved from an apartment with mold, rats and cockroaches and this seemed to flare her asthma/allergies.  However, continues to have issues with her asthma even since moving out. She has received allergy shots in the past (over 5 years ago), but discontinued before reaching goal.  Current asthma regimen:  Maintenance: Symbicort 160 mcg 2 puffs BID and Singulair (montelukast)-had to stop recently due to depression Controller: Albuterol 2 puffs q4-6 hrs PRN  Up-to-date with Covid-19,vaccines. History of prior pneumonias: 1 History of prior COVID-19 infection: 0 Smoking exposure: none  Chronic allergic rhinitis: started in childhood, symptoms include: nasal congestion rhinorrhea post nasal drainage sneezing, occur  seasonally in Spring and Summer Treatments tried: Zyrtec, Flonase-doesn't feel these helped and these symptoms are not as bothersome as her breathing Previous  allergy testing: yes History of reflux/heartburn: no  Review of records- Most recent AEC 400 from July She follows with Dr. Sherene Sampson in pulmonology for her asthma which was controlled at her last visit in July. Per these records has had positive allergy testing in 2014 to multiple outdoor allergens.  Also had an elevated IgE of 1144. Excerpt from Dr. Thurston HoleWert's notes with important history regarding asthma course and previous spiros:  Onset in Childhood Spiro 2001: FEV1 2.38 (68%), ratio 80. AP 03/20/2004 IgE 1877, RAST very abnormal 2007. Aspergillus CF <1:8 Cleda DaubSpiro 04/2012: FEV1 1.72 (58%), ratio 68 No response to singulair.  Allergy Skin Test- 03/25/13- significant positive for grass weed and tree pollens dust mite, cat - changed advair to BREO 12/15/2014 > [facility] 07/08/2015  - 04/17/2017 extensive coaching HFA effectiveness = 75% From a baseline of 50% - Spirometry 12/16/2017 FEV1 2.14 (74%) Ratio 79 p am dulera 100 x 2  - FENO 12/16/2017 = 7 p dulera 100 x 2  - 12/16/2017 After extensive coaching inhaler device effectiveness = 75% and 6 weeks iup > change to symb 80 2bid  - spring 2020 new dog in house  - 10/22/2018 change to symbicort 160 2 bid and rx pred x 6 days  - 03/11/2019 After extensive coaching inhaler device, effectiveness = 75% (short ti)  - 10/21/2020 After extensive coaching inhaler device, effectiveness = 50% (late trigger)  - 10/21/2020 added singulair  -Allergy profile 12/31/20 > Eos 0.3 / IgE 2259  - 01/20/2021 After extensive coaching inhaler device, effectiveness = 60% (trigger was delayed and short ti)  - 01/20/2021 referred to allergy   Other allergy screening: Food allergy: no Medication allergy: yes-penicillins Hymenoptera allergy: no Urticaria: no Eczema:yes-no  longer active History of recurrent infections suggestive of immunodeficency: no Vaccinations are up to date.   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     albuterol (PROAIR HFA) 108 (90 Base) MCG/ACT inhaler 2 puffs every 4 hours as needed only  if your can't catch your breath 18 g 1   albuterol (PROVENTIL) (2.5 MG/3ML) 0.083% nebulizer solution Take 6 mLs (5 mg total) by nebulization every 6 (six) hours as needed for wheezing or shortness of breath. DX: J45.40 75 mL 3   budesonide-formoterol (SYMBICORT) 160-4.5 MCG/ACT inhaler TAKE 2 PUFFS BY MOUTH TWICE A DAY 10.2 each 11   ibuprofen (ADVIL) 800 MG tablet Take 1 tablet (800 mg total) by mouth every 8 (eight) hours as needed. (Patient not taking: Reported on 02/13/2021) 21 tablet 0   montelukast (SINGULAIR) 10 MG tablet Take 1 tablet (10 mg total) by mouth at bedtime. (Patient not taking: Reported on 02/13/2021) 30 tablet 11   predniSONE (DELTASONE) 10 MG tablet 4 tabs for 2 days, then 3 tabs for 2 days, 2 tabs for 2 days, then 1 tab for 2 days, then stop (Patient not taking: Reported on 02/13/2021) 20 tablet 0   No current facility-administered medications for this visit.   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   ROS:  All other systems negative except as noted per HPI.  Objective:   Blood pressure 122/80, pulse 81, temperature 98.1 F (36.7 C), temperature source Temporal, resp. rate 20, height 5' 6.75" (1.695 m), weight 199 lb 6.4 oz (90.4 kg), SpO2 98 %. Body mass index is 31.46 kg/m. Physical Exam:  General Appearance:  Alert, cooperative, no distress, appears stated age  Head:  Normocephalic, without obvious abnormality, atraumatic  Eyes:  Conjunctiva clear, EOM's intact  Nose: Nares normal, normal turbinates, pink mucosa, no drainage  Throat: Lips, tongue normal; teeth and gums normal, normal posterior oropharynx  Neck: Supple, symmetrical  Lungs:   Clear to auscultation bilaterally, no wheezing respirations unlabored, no coughing  Heart:  Rate and rhythm, no murmur appears well perfused  Extremities: No edema  Skin: Skin color, texture, turgor normal, no rashes or lesions on visualized portions of skin, 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 Her effort: Good reproducible efforts. FVC: 2.07 L, pred 3.42 FEV1: 1.73 L, 62 % predicted FEV1/FVC ratio: 104 % Possible restriction based on today's results, but previous pulmonology spirometry shows a moderate obstruction. Please see scanned spirometry results for details.  Skin Testing: Environmental allergy panel. Results discussed with  patient/family.  Airborne Adult Perc - 02/13/21 1445     Time Antigen Placed 1445    Allergen Manufacturer Waynette Buttery    Number of Test 59    1. Control-Buffer 50% Glycerol Negative    2. Control-Histamine 1 mg/ml 3+    3. Albumin saline 3+    4. Bahia 4+    5. [place] 3+    6. Johnson 3+    7. Kentucky Blue 4+    8. Meadow Fescue 4+    9. Perennial Rye 3+    10. Sweet Vernal 3+    11. Timothy 3+    12. Cocklebur 3+    13. Burweed Marshelder 3+    14. Ragweed, short 3+    15. Ragweed, Giant 3+    16. Plantain,  English 3+    17. Lamb's Quarters 3+    18. Sheep Sorrell 3+    19.  Rough Pigweed 3+    20. Marsh Elder, Rough 3+    21. Mugwort, Common 3+    22. Ash mix 3+    23. Birch mix 3+    24. Beech American 3+    25. Box, Elder 3+    26. Cedar, red 3+    27. Cottonwood, Eastern 3+    28. Elm mix 3+    29. Hickory 4+    30. Maple mix Negative    31. Oak, [place] mix 3+    32. Pecan Pollen 3+    33. Pine mix 3+    34. Sycamore Eastern 4+    35. Walnut, Black Pollen 3+    36. Alternaria alternata 3+    37. Cladosporium Herbarum 3+    38. Aspergillus mix 3+    39. Penicillium mix 3+    40. Bipolaris sorokiniana (Helminthosporium) 3+    41. Drechslera spicifera (Curvularia) 3+    42. Mucor plumbeus 3+    43. Fusarium moniliforme 3+    44. Aureobasidium pullulans (pullulara) 3+    45. Rhizopus oryzae 3+    46. Botrytis cinera 3+    47. Epicoccum nigrum 3+    48. Phoma betae 3+    49. Candida Albicans 3+    50. Trichophyton mentagrophytes 3+    51. Mite, D Farinae  5,000 AU/ml 3+    52. Mite, D Pteronyssinus  5,000 AU/ml 3+    53. Cat Hair 10,000 BAU/ml 3+    54.  Dog Epithelia 4+    55. Mixed Feathers 3+    56. Horse Epithelia 3+    57. Cockroach, German 3+    58. Mouse Negative    59. Tobacco Leaf Negative             Allergy testing results were read and interpreted by myself, documented by clinical staff.  Assessment:  Severe persistent asthma without complication - uncontrolled as per history based on her frequent use of rescue inhaler, frequent nighttime and daytime symptoms, and need for oral steroids and ED visits at least once this year for asthma.  Based on review of her previous labs I think she would be a great candidate for an an anti-IL5 biologic.  We discussed both Nucala and Harrington Challenger with shared decision making to trial Harrington Challenger first.  She would prefer to get her shots in clinic due to significant needle aversion and every 8 week timing would work best for her.  Seasonal and perennial allergic rhinitis -she has pan-sensitivity to  environmental allergens though her test was  a little difficult to interpret due to dermatographism.  She is not as bothered by her nasal symptoms.  If asthma becomes controlled could consider allergy shots in the future for long-term control if she is interested. Plan/Recommendations:   Patient Instructions  Severe Persistent Asthma-uncontrolled: - your allergy testing today was positive to most grasses, trees, weeds, molds, dust mites, pet dander, cockroach - Continue Symbicort 160 mg 2 puffs twice a day with a spacer; THIS SHOULD BE USED EVERY DAY - Rinse mouth out after use - Use Albuterol (Proair/Ventolin) 2 puffs every 4-6 hours as needed for chest tightness, wheezing, or coughing - Use Albuterol (Proair/Ventolin) 2 puffs 15 minutes prior to exercise if you have symptoms with activity - Use a spacer with all inhalers - please keep track of how often you are needing rescue inhaler Albuterol (Proair/Ventolin) as this will help guide future management - Asthma is not controlled if:  - Symptoms are occurring >2 times a week OR  - >2 times a month nighttime awakenings  - Please call the clinic to schedule a follow up if these symptoms arise - you would be a great candidate for an anti-eosinophilic biologic called Fasenra (benralizumab)  - I will start paperwork for this and someone should call you soon with information.  - recommend yearly flu vaccine  Seasonal and Perennial Allergic Rhinitis- stable - consider daily antihistamine for better symptom control antihistamine Your options include: Zyrtec (cetirizine) 10 mg, Claritin (loratadine) 10 mg, Xyzal (levocetirizine) 5 mg or Allegra (fexofenadine) 180 mg daily as needed   Follow-up in 3-4 months.  This note in its entirety was forwarded to the Provider who requested this consultation.  Thank you for your kind referral. I appreciate the opportunity to take part in Rakhi's care. Please do not hesitate to contact me with  questions.  Sincerely,  Tonny Bollman, MD [facility]

## 2021-02-14 ENCOUNTER — Telehealth: Payer: Self-pay | Admitting: *Deleted

## 2021-02-14 ENCOUNTER — Encounter (HOSPITAL_BASED_OUTPATIENT_CLINIC_OR_DEPARTMENT_OTHER): Payer: Self-pay

## 2021-02-14 NOTE — Telephone Encounter (Signed)
-----   Message from Tonny Bollman, MD sent at 02/13/2021  4:56 PM EDT ----- Hi Meygan Kyser!  I would like this patient to start Fasenra.  Her most recent AEC was 400, has had 3 ED visits and 1 course of OCS this year. Thanks.

## 2021-02-14 NOTE — Telephone Encounter (Signed)
I have patient Ins approval for Fasenra. Since I am not aware of your preferences yet do you want patient to self admin or get injections in clinic.  Usually the other MDs just base it on case by case basis and if they think they may not be adherent to therapy have them come into clinic for injections.  For at home admin we always bring them into clinic for initial start of therapy.  I will wait your response before reaching out to patient.

## 2021-02-14 NOTE — Telephone Encounter (Signed)
Called patient and advised approval and submit to [facility] for Fasenra. Will reach out to patient once delivery set up to make appt to start therapy.

## 2021-02-15 ENCOUNTER — Ambulatory Visit (HOSPITAL_BASED_OUTPATIENT_CLINIC_OR_DEPARTMENT_OTHER): Payer: Medicaid Other | Admitting: Family Medicine

## 2021-03-21 ENCOUNTER — Telehealth: Payer: Self-pay | Admitting: *Deleted

## 2021-03-21 NOTE — Telephone Encounter (Signed)
L/m for patient Crystal Sampson delivery 10/4 and can reach out to clinic 10/4 pm or later to schedule appt to start [facility]

## 2021-03-30 ENCOUNTER — Other Ambulatory Visit: Payer: Self-pay | Admitting: Internal Medicine

## 2021-04-05 ENCOUNTER — Ambulatory Visit: Payer: Medicaid Other

## 2021-04-07 ENCOUNTER — Ambulatory Visit: Payer: Medicaid Other

## 2021-04-10 ENCOUNTER — Ambulatory Visit (INDEPENDENT_AMBULATORY_CARE_PROVIDER_SITE_OTHER): Payer: Medicaid Other

## 2021-04-10 ENCOUNTER — Other Ambulatory Visit: Payer: Self-pay

## 2021-04-10 DIAGNOSIS — J4550 Severe persistent asthma, uncomplicated: Secondary | ICD-10-CM

## 2021-04-10 MED ORDER — BENRALIZUMAB 30 MG/ML ~~LOC~~ SOSY
30.0000 mg | PREFILLED_SYRINGE | SUBCUTANEOUS | Status: DC
  Administered 2021-04-10 – 2021-07-17 (×3): 30 mg via SUBCUTANEOUS

## 2021-04-15 ENCOUNTER — Other Ambulatory Visit: Payer: Self-pay | Admitting: Primary Care

## 2021-05-08 ENCOUNTER — Ambulatory Visit (INDEPENDENT_AMBULATORY_CARE_PROVIDER_SITE_OTHER): Payer: Medicaid Other | Admitting: *Deleted

## 2021-05-08 DIAGNOSIS — J4550 Severe persistent asthma, uncomplicated: Secondary | ICD-10-CM

## 2021-05-14 NOTE — Progress Notes (Deleted)
FOLLOW UP Date of Service/Encounter:  05/14/21   Subjective:  Crystal Sampson (DOB: Jul 05, 1975) is a 45 y.o. female with PMHX of moderate persistent asthma, allergic rhinoconjunctivitis, obesity, GERD, anxiety and depression who [facility] in re-evaluation of the following: *** History obtained from: chart review and {Persons; PED relatives w/patient:19415::"patient"}.  For Review, LV was on 02/13/21  with Dr.Sulamita Lafountain seen for moderate persistent asthma and allergic rhinitis.  Last FEV1-62% (ratio 104%). We continued Symbicort  and submitted paperwork for Fasenra, and continued her daily antihistamine. She started [place] on 02/13/21.  Today she presents for follow-up.  ***  Previous diagnostics:  12/2020-AEC 400 Total IgE 1144 Allergy Skin Test- 03/25/13- significant positive for grass weed and tree pollens dust mite, cat Follows with Dr. Sherene Sires in [facility] for asthma. SPT environmental panel: + grasses, weeds, trees, ragweed, molds, dust mites, cat, dog, horse, mixed feathers, cockroach   Allergies as of 05/15/2021       Reactions   Penicillins Swelling        Medication List        Accurate as of May 14, 2021  7:48 PM. If you have any questions, ask your nurse or doctor.          albuterol 108 (90 Base) MCG/ACT inhaler Commonly known as: ProAir HFA INHALE 2 PUFFS EVERY 4 HOURS AS NEEDED ONLY IF YOU CANT CATCH YOUR BREATH   albuterol (2.5 MG/3ML) 0.083% nebulizer solution Commonly known as: PROVENTIL USE 2 VIALS BY NEBULIZATION EVERY 6 (SIX) HOURS AS NEEDED FOR WHEEZING OR SHORTNESS OF BREATH. DX: J45.40   budesonide-formoterol 160-4.5 MCG/ACT inhaler Commonly known as: Symbicort TAKE 2 PUFFS BY MOUTH TWICE A DAY   naproxen sodium 220 MG tablet Commonly known as: ALEVE Take 220 mg by mouth.       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  ROS: All others negative except as noted per HPI.   Objective:  There were no vitals taken for this visit. There is no height or weight on file to calculate BMI. Physical Exam: General Appearance:  Alert, cooperative, no distress, appears stated age  Head:  Normocephalic, without obvious abnormality, atraumatic  Eyes:  Conjunctiva clear, EOM's intact  Nose: Nares normal  Throat: Lips, tongue normal; teeth and gums normal  Neck: Supple, symmetrical  Lungs:   Respirations unlabored, no coughing  Heart:  Appears well perfused  Extremities: No edema  Skin: Skin color, texture, turgor normal, no rashes or lesions on visualized portions of skin  Neurologic: No gross deficits   Reviewed: ***  Spirometry:  Tracings reviewed. Her effort: {Blank single:19197::"Good reproducible efforts.","It was hard to get consistent efforts and there is a question as to whether this reflects a maximal maneuver.","Poor effort, data can not be interpreted."} FVC: ***L FEV1: ***L, ***% predicted FEV1/FVC ratio: ***% Interpretation: {Blank single:19197::"Spirometry consistent with mild obstructive disease","Spirometry consistent with moderate obstructive disease","Spirometry consistent with severe obstructive disease","Spirometry consistent with possible restrictive disease","Spirometry consistent with mixed obstructive and restrictive disease","Spirometry uninterpretable due to technique","Spirometry consistent with normal pattern","No  overt abnormalities noted given today's efforts"}.  Please see scanned spirometry results for details.  Skin Testing: {Blank single:19197::"Select foods","Environmental allergy panel","Environmental  allergy panel and select foods","Food allergy panel","None","Deferred due to recent antihistamines use"}. Positive test to: ***. Negative test to: ***.  Results discussed with patient/family.   {Blank single:19197::"Allergy testing results were read and interpreted by myself, documented by clinical staff."," "}  Assessment/Plan  There are no Patient Instructions on file for this visit.  No follow-ups on file.  Tonny Bollman, MD  [facility]

## 2021-05-15 ENCOUNTER — Ambulatory Visit: Payer: Medicaid Other | Admitting: Internal Medicine

## 2021-05-29 ENCOUNTER — Telehealth: Payer: Self-pay | Admitting: Internal Medicine

## 2021-05-29 NOTE — Telephone Encounter (Signed)
Would prefer though she contact allergist  as This is clearly not a pulmonary problem and it's why I referred to allergy with prn pulmonary f/u once established (one cook in the kitchen at a time) but in meantime she can try For drainage/ nasal congestion ok to try  take CHLORPHENIRAMINE  4 mg    take one every 4 hours as needed - available over the counter- may cause drowsiness so start with just a dose or two an hour before bedtime and see how you tolerate it before trying in daytime

## 2021-05-29 NOTE — Telephone Encounter (Signed)
Called and spoke with Patient.  Patient stated she is having increase sneezing and nasal congestion.  Patient stated she has tried different decongestants, night time Nyquil, Vick's, and used a [employer], but nothing seems to help.  Patient requested recommendations from Dr. Sherene Sires and any prescriptions to be sent to [facility].  Patient stated message can wait until 05/30/21, if needed.   LOV 01/20/21-Dr Wert  Instructions  We will be referring you to allergy - once you establish with allergy we can see you as needed    Work on inhaler technique:  relax and gently blow all the way out then take a nice smooth full deep breath back in, triggering the inhaler at same time you start breathing in.  Hold for up to 5 seconds if you can. Blow out thru nose. Rinse and gargle with water when done.  If mouth or throat bother you at all,  try brushing teeth/gums/tongue with arm and hammer toothpaste/ make a slurry and gargle and spit out.       Message routed to Dr. Sherene Sires to advise

## 2021-05-30 NOTE — Telephone Encounter (Signed)
Spoke with the pt and notified of response per MW  She verbalized understanding  Nothing further needed 

## 2021-06-05 ENCOUNTER — Ambulatory Visit: Payer: Medicaid Other

## 2021-06-09 ENCOUNTER — Emergency Department (HOSPITAL_BASED_OUTPATIENT_CLINIC_OR_DEPARTMENT_OTHER)
Admission: EM | Admit: 2021-06-09 | Discharge: 2021-06-09 | Disposition: A | Discharge status: Home / Self Care | Payer: Medicaid Other | Attending: Emergency Medicine | Admitting: Emergency Medicine

## 2021-06-09 ENCOUNTER — Encounter (HOSPITAL_BASED_OUTPATIENT_CLINIC_OR_DEPARTMENT_OTHER): Payer: Self-pay

## 2021-06-09 ENCOUNTER — Other Ambulatory Visit: Payer: Self-pay

## 2021-06-09 DIAGNOSIS — N3090 Cystitis, unspecified without hematuria: Secondary | ICD-10-CM | POA: Diagnosis not present

## 2021-06-09 DIAGNOSIS — Z79899 Other long term (current) drug therapy: Secondary | ICD-10-CM | POA: Diagnosis not present

## 2021-06-09 DIAGNOSIS — R300 Dysuria: Secondary | ICD-10-CM | POA: Diagnosis present

## 2021-06-09 DIAGNOSIS — J45909 Unspecified asthma, uncomplicated: Secondary | ICD-10-CM | POA: Insufficient documentation

## 2021-06-09 LAB — URINALYSIS, MICROSCOPIC (REFLEX): WBC, UA: >50 WBC/hpf (ref 0–5)

## 2021-06-09 LAB — URINALYSIS, ROUTINE W REFLEX MICROSCOPIC
Bilirubin Urine: NEGATIVE
Glucose, UA: NEGATIVE mg/dL
Ketones, ur: NEGATIVE mg/dL
Nitrite: NEGATIVE
Protein, ur: 30 mg/dL — AB
Specific Gravity, Urine: 1.025 (ref 1.005–1.030)
pH: 7 (ref 5.0–8.0)

## 2021-06-09 LAB — PREGNANCY, URINE: Preg Test, Ur: NEGATIVE

## 2021-06-09 MED ORDER — PHENAZOPYRIDINE HCL 100 MG PO TABS
200.0000 mg | ORAL_TABLET | Freq: Once | ORAL | Status: AC
  Administered 2021-06-09: 200 mg via ORAL
  Filled 2021-06-09: qty 2

## 2021-06-09 MED ORDER — NITROFURANTOIN MONOHYD MACRO 100 MG PO CAPS: 100.0000 mg | ORAL_CAPSULE | Freq: Two times a day (BID) | ORAL | 0 refills | Status: DC

## 2021-06-09 MED ORDER — PHENAZOPYRIDINE HCL 200 MG PO TABS
200.0000 mg | ORAL_TABLET | Freq: Three times a day (TID) | ORAL | 0 refills | Status: DC | PRN
Start: 2021-06-09 — End: 2021-07-17

## 2021-06-09 MED ORDER — NITROFURANTOIN MONOHYD MACRO 100 MG PO CAPS
100.0000 mg | ORAL_CAPSULE | Freq: Once | ORAL | Status: AC
  Administered 2021-06-09: 100 mg via ORAL
  Filled 2021-06-09: qty 1

## 2021-06-09 NOTE — ED Provider Notes (Signed)
[facility] Provider Note: Crystal Dell, MD, FACEP  CSN: [id number] MRN: [id number] ARRIVAL: 06/09/21 at 0104 ROOM: MH09/MH09   CHIEF COMPLAINT  Dysuria   HISTORY OF PRESENT ILLNESS  06/09/21 1:25 AM Crystal Sampson is a 45 y.o. female with about 3 days of urinary urgency and frequency.  She has had associated suprapubic pain which she describes as dull and rates as a 5 out of 10.  She states the pain is constant.  She has had gross hematuria with this as well.  She denies fever, chills or back pain.  She states she does feel gassy.   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 Authorizing Provider  nitrofurantoin, macrocrystal-monohydrate, (MACROBID) 100 MG capsule Take 1 capsule (100 mg total) by mouth 2 (two) times daily. X 7 days 06/09/21  Yes Samara Stankowski, MD  phenazopyridine (PYRIDIUM) 200 MG tablet Take 1 tablet (200 mg total) by mouth 3 (three) times daily as needed (urinary pain). 06/09/21  Yes Kelissa Merlin, MD  albuterol (PROAIR HFA) 108 (90 Base) MCG/ACT inhaler INHALE 2 PUFFS EVERY 4 HOURS AS NEEDED ONLY IF YOU CANT CATCH YOUR BREATH 03/30/21   Nyoka Cowden, MD  albuterol (PROVENTIL) (2.5 MG/3ML) 0.083% nebulizer solution USE 2 VIALS BY NEBULIZATION EVERY 6 (SIX) HOURS AS NEEDED FOR WHEEZING OR SHORTNESS OF BREATH. DX: J45.40  04/18/21   Nyoka Cowden, MD  budesonide-formoterol (SYMBICORT) 160-4.5 MCG/ACT inhaler TAKE 2 PUFFS BY MOUTH TWICE A DAY 01/24/21   Nyoka Cowden, MD  naproxen sodium (ALEVE) 220 MG tablet Take 220 mg by mouth.    [provider]    Allergies Penicillins   REVIEW OF SYSTEMS  Negative except as noted here or in the History of Present Illness.   PHYSICAL EXAMINATION  Initial Vital Signs Blood pressure 124/83, pulse 87, temperature 98 F (36.7 C), temperature source Oral, resp. rate 16, height 5\' 8"  (1.727 m), weight 89.4 kg, last menstrual period 05/30/2021, SpO2 98 %.  Examination General: Well-developed, well-nourished female in no acute distress; appearance consistent with age of record HENT: normocephalic; atraumatic Eyes: Normal appearing Neck: supple Heart: regular rate and rhythm Lungs: clear to auscultation bilaterally Abdomen: soft; nondistended; nontender; bowel sounds present GU: No CVA tenderness; grossly cloudy urine Extremities: No deformity; full range of motion; pulses normal Neurologic: Awake, alert and oriented; motor function intact in all extremities and symmetric; no facial droop Skin: Warm and dry Psychiatric: Normal mood and affect   RESULTS  Summary of this visit's results, reviewed and interpreted by myself:   EKG Interpretation  Date/Time:    Ventricular Rate:    PR Interval:    QRS  Duration:   QT Interval:    QTC Calculation:   R Axis:     Text Interpretation:         Laboratory Studies: Results for orders placed or performed during the hospital encounter of 06/09/21 (from the past 24 hour(s))  Urinalysis, Routine w reflex microscopic Urine, Clean Catch     Status: Abnormal   Collection Time: 06/09/21  1:28 AM  Result Value Ref Range   Color, Urine YELLOW YELLOW   APPearance TURBID (A) CLEAR   Specific Gravity, Urine 1.025 1.005 - 1.030   pH 7.0 5.0 - 8.0   Glucose, UA NEGATIVE NEGATIVE mg/dL   Hgb urine dipstick LARGE  (A) NEGATIVE   Bilirubin Urine NEGATIVE NEGATIVE   Ketones, ur NEGATIVE NEGATIVE mg/dL   Protein, ur 30 (A) NEGATIVE mg/dL   Nitrite NEGATIVE NEGATIVE   Leukocytes,Ua MODERATE (A) NEGATIVE  Pregnancy, urine     Status: None   Collection Time: 06/09/21  1:28 AM  Result Value Ref Range   Preg Test, Ur NEGATIVE NEGATIVE  Urinalysis, Microscopic (reflex)     Status: Abnormal   Collection Time: 06/09/21  1:28 AM  Result Value Ref Range   RBC / HPF 21-50 0 - 5 RBC/hpf   WBC, UA >50 0 - 5 WBC/hpf   Bacteria, UA FEW (A) NONE SEEN   Squamous Epithelial / LPF 0-5 0 - 5   WBC Clumps PRESENT    Imaging Studies: No results found.  ED COURSE and MDM  Nursing notes, initial and subsequent vitals signs, including pulse oximetry, reviewed and interpreted by myself.  Vitals:   06/09/21 0117 06/09/21 0122  BP:  124/83  Pulse:  87  Resp:  16  Temp:  98 F (36.7 C)  TempSrc:  Oral  SpO2:  98%  Weight: 89.4 kg   Height: 5\' 8"  (1.727 m)    Medications  nitrofurantoin (macrocrystal-monohydrate) (MACROBID) capsule 100 mg (has no administration in time range)  phenazopyridine (PYRIDIUM) tablet 200 mg (has no administration in time range)   Presentation and urinalysis consistent with acute cystitis with small amount of hemorrhage.  Will start on Macrobid.   PROCEDURES  Procedures   ED DIAGNOSES     ICD-10-CM   1. Cystitis  N30.90          Kainen Struckman, MD 06/09/21 0201

## 2021-06-09 NOTE — ED Triage Notes (Signed)
Patient complains of increased urinary urgency and frequency about 3 days ago.  She also states she has had several episodes of seeing blood in her urine.

## 2021-06-10 LAB — URINE CULTURE

## 2021-06-26 ENCOUNTER — Encounter (HOSPITAL_BASED_OUTPATIENT_CLINIC_OR_DEPARTMENT_OTHER): Payer: Self-pay | Admitting: Emergency Medicine

## 2021-06-26 ENCOUNTER — Emergency Department (HOSPITAL_BASED_OUTPATIENT_CLINIC_OR_DEPARTMENT_OTHER)
Admission: EM | Admit: 2021-06-26 | Discharge: 2021-06-26 | Disposition: A | Discharge status: Home / Self Care | Payer: Medicaid Other | Attending: Emergency Medicine | Admitting: Emergency Medicine

## 2021-06-26 ENCOUNTER — Other Ambulatory Visit: Payer: Self-pay

## 2021-06-26 DIAGNOSIS — J069 Acute upper respiratory infection, unspecified: Secondary | ICD-10-CM | POA: Diagnosis not present

## 2021-06-26 DIAGNOSIS — R059 Cough, unspecified: Secondary | ICD-10-CM | POA: Diagnosis present

## 2021-06-26 DIAGNOSIS — Z20822 Contact with and (suspected) exposure to covid-19: Secondary | ICD-10-CM | POA: Diagnosis not present

## 2021-06-26 LAB — RESP PANEL BY RT-PCR (FLU A&B, COVID) ARPGX2
Influenza A by PCR: NEGATIVE
Influenza B by PCR: NEGATIVE
SARS Coronavirus 2 by RT PCR: NEGATIVE

## 2021-06-26 MED ORDER — ACETAMINOPHEN 500 MG PO TABS
1000.0000 mg | ORAL_TABLET | Freq: Once | ORAL | Status: AC
  Administered 2021-06-26: 1000 mg via ORAL
  Filled 2021-06-26: qty 2

## 2021-06-26 MED ORDER — GUAIFENESIN 100 MG/5ML PO LIQD
15.0000 mL | Freq: Once | ORAL | Status: AC
  Administered 2021-06-26: 15 mL via ORAL
  Filled 2021-06-26: qty 20

## 2021-06-26 MED ORDER — GUAIFENESIN-DM 100-10 MG/5ML PO SYRP: 15.0000 mL | ORAL_SOLUTION | Freq: Once | ORAL | Status: DC

## 2021-06-26 NOTE — ED Provider Notes (Signed)
[facility] EMERGENCY DEPARTMENT Provider Note   CSN: [id number] Arrival date & time: 06/26/21  0479     History  Chief Complaint  Patient presents with   URI    Crystal Sampson is a 46 y.o. female.  Pt c/o sneezing, non prod cough, congestion, rhinorrhea, in the past week. Symptoms acute onset, moderate, persistent. Has been around others w uri symptoms, no definite known covid or flu exposure. Denies chest pain or discomfort. No severe headaches. No neck pain or stiffness. No sinus pressure/pain. No trouble breathing or swallowing. No recent asthma flare or increased wheezing. No abd pain or nvd. No gu c/o.   The history is provided by the patient and medical records.  URI Presenting symptoms: congestion, cough and rhinorrhea   Presenting symptoms: no fever   Associated symptoms: no headaches and no neck pain       Home Medications Prior to Admission medications   Medication Sig Start Date End Date Taking? Authorizing Provider  albuterol (PROAIR HFA) 108 (90 Base) MCG/ACT inhaler INHALE 2 PUFFS EVERY 4 HOURS AS NEEDED ONLY IF YOU CANT CATCH YOUR BREATH 03/30/21   Nyoka Cowden, MD  albuterol (PROVENTIL) (2.5 MG/3ML) 0.083% nebulizer solution USE 2 VIALS BY NEBULIZATION EVERY 6 (SIX) HOURS AS NEEDED FOR WHEEZING OR SHORTNESS OF BREATH. DX: J45.40 04/18/21   Nyoka Cowden, MD  budesonide-formoterol (SYMBICORT) 160-4.5 MCG/ACT inhaler TAKE 2 PUFFS BY MOUTH TWICE A DAY 01/24/21   Nyoka Cowden, MD  naproxen sodium (ALEVE) 220 MG tablet Take 220 mg by mouth.    [provider]  nitrofurantoin, macrocrystal-monohydrate, (MACROBID) 100 MG capsule Take 1 capsule (100 mg total) by mouth 2 (two) times daily. X 7 days 06/09/21   Molpus, John, MD  phenazopyridine (PYRIDIUM) 200 MG tablet Take 1 tablet (200 mg total) by mouth 3 (three) times daily as needed (urinary pain). 06/09/21   Molpus, Jonny Ruiz, MD      Allergies    Penicillins    Review of Systems   Review of  Systems  Constitutional:  Negative for fever.  HENT:  Positive for congestion and rhinorrhea. Negative for trouble swallowing.   Eyes:  Negative for redness.  Respiratory:  Positive for cough. Negative for shortness of breath.   Cardiovascular:  Negative for chest pain and leg swelling.  Gastrointestinal:  Negative for abdominal pain, diarrhea and vomiting.  Genitourinary:  Negative for dysuria.  Musculoskeletal:  Negative for back pain, neck pain and neck stiffness.  Skin:  Negative for rash.  Neurological:  Negative for headaches.  Hematological:  Does not bruise/bleed easily.  Psychiatric/Behavioral:  Negative for confusion.    Physical Exam Updated Vital Signs BP 123/89 (BP Location: Right Arm)    Pulse 84    Temp 98.2 F (36.8 C) (Oral)    Resp 16    Ht 1.727 m (5\' 8" )    Wt 96 kg    LMP 06/19/2021 (Approximate)    SpO2 98%    BMI 32.17 kg/m  Physical Exam Vitals and nursing note reviewed.  Constitutional:      Appearance: Normal appearance. She is well-developed.  HENT:     Head: Atraumatic.     Right Ear: Tympanic membrane normal.     Left Ear: Tympanic membrane normal.     Nose: Congestion present.     Comments: No sinus pain, tenderness or pressure.     Mouth/Throat:     Mouth: Mucous membranes are moist.  Pharynx: Oropharynx is clear. No oropharyngeal exudate or posterior oropharyngeal erythema.  Eyes:     General: No scleral icterus.    Conjunctiva/sclera: Conjunctivae normal.     Pupils: Pupils are equal, round, and reactive to light.  Neck:     Trachea: No tracheal deviation.     Comments: No stiffness or rigidity.  Cardiovascular:     Rate and Rhythm: Normal rate and regular rhythm.     Pulses: Normal pulses.     Heart sounds: Normal heart sounds. No murmur heard.   No friction rub. No gallop.  Pulmonary:     Effort: Pulmonary effort is normal. No respiratory distress.     Breath sounds: Normal breath sounds. No wheezing.  Abdominal:     General: Bowel  sounds are normal. There is no distension.     Palpations: Abdomen is soft.     Tenderness: There is no abdominal tenderness.  Genitourinary:    Comments: No cva tenderness.  Musculoskeletal:        General: No swelling or tenderness.     Cervical back: Normal range of motion and neck supple. No rigidity. No muscular tenderness.     Right lower leg: No edema.     Left lower leg: No edema.  Skin:    General: Skin is warm and dry.     Findings: No rash.  Neurological:     Mental Status: She is alert.     Comments: Alert, speech normal. Steady gait.   Psychiatric:        Mood and Affect: Mood normal.    ED Results / Procedures / Treatments   Labs (all labs ordered are listed, but only abnormal results are displayed) Labs Reviewed  RESP PANEL BY RT-PCR (FLU A&B, COVID) [id number]    EKG None  Radiology No results found.  Procedures Procedures    Medications Ordered in ED Medications - No data to display  ED Course/ Medical Decision Making/ A&P                           Medical Decision Making  Labs sent.   No meds today or pta.   Acetaminophen po. Robitussin po.  Pt requests work note - provided.  Reviewed nursing notes and prior charts for additional history. Outside records reviewed.   Labs reviewed/interpreted by me - pnd.   Pt is breathing comfortably, vitals normal, and appears stable for d/c.   Return precautions provided.          Final Clinical Impression(s) / ED Diagnoses Final diagnoses:  None    Rx / DC Orders ED Discharge Orders     None         Cathren Laine, MD 06/26/21 [id number]

## 2021-06-26 NOTE — Discharge Instructions (Signed)
It was our pleasure to provide your ER care today - we hope that you feel better.  Your covid/flu test should be resulted in the next 1-2 hours - you may check MyChart or call for results.   Drink plenty of fluids/stay well hydrated. Take over the counter cold/flu medication as need for symptom relief.   Return to ER if worse, new symptoms, increased trouble breathing, or other concern.

## 2021-06-26 NOTE — ED Notes (Signed)
Saw an allergy MD, rec allergy injections. States has since developed cold, congestion signs and symptoms.

## 2021-06-26 NOTE — ED Triage Notes (Signed)
Sneezing, body aches, occasional cough, no known fever.  Symptoms x 1 week.  Having nasal drainage and congestion, green yellow drainage.

## 2021-06-26 NOTE — ED Notes (Signed)
Covid/Flu swab obtained.

## 2021-06-30 ENCOUNTER — Other Ambulatory Visit: Payer: Self-pay

## 2021-06-30 ENCOUNTER — Ambulatory Visit (INDEPENDENT_AMBULATORY_CARE_PROVIDER_SITE_OTHER): Payer: Medicaid Other

## 2021-06-30 ENCOUNTER — Ambulatory Visit (INDEPENDENT_AMBULATORY_CARE_PROVIDER_SITE_OTHER): Payer: Medicaid Other | Admitting: Nurse Practitioner

## 2021-06-30 ENCOUNTER — Encounter: Payer: Self-pay | Admitting: Nurse Practitioner

## 2021-06-30 VITALS — BP 108/68 | HR 91 | Temp 97.9°F | Ht 68.0 in | Wt 209.0 lb

## 2021-06-30 DIAGNOSIS — J019 Acute sinusitis, unspecified: Secondary | ICD-10-CM

## 2021-06-30 DIAGNOSIS — J4531 Mild persistent asthma with (acute) exacerbation: Secondary | ICD-10-CM | POA: Diagnosis not present

## 2021-06-30 DIAGNOSIS — R059 Cough, unspecified: Secondary | ICD-10-CM

## 2021-06-30 DIAGNOSIS — B9689 Other specified bacterial agents as the cause of diseases classified elsewhere: Secondary | ICD-10-CM

## 2021-06-30 DIAGNOSIS — J4541 Moderate persistent asthma with (acute) exacerbation: Secondary | ICD-10-CM

## 2021-06-30 MED ORDER — PREDNISONE 20 MG PO TABS: 40.0000 mg | ORAL_TABLET | Freq: Every day | ORAL | 0 refills | Status: AC

## 2021-06-30 MED ORDER — FLUTICASONE PROPIONATE 50 MCG/ACT NA SUSP: 1.0000 | Freq: Every day | NASAL | 2 refills | Status: DC

## 2021-06-30 MED ORDER — DOXYCYCLINE HYCLATE 100 MG PO TABS: 100.0000 mg | ORAL_TABLET | Freq: Two times a day (BID) | ORAL | 0 refills | Status: DC

## 2021-06-30 NOTE — Progress Notes (Addendum)
@Patient  ID: Crystal Sampson, female    DOB: 01/29/1976, 46 y.o.   MRN: [id number]  Chief Complaint  Patient presents with   Follow-up    Patient says she's been coughing up brown phlegm.     Referring provider: No ref. provider found  HPI: 46 year old female, never smoker followed for moderate persistent asthma.  She is a patient of Dr. Gustavus Bryant and was last seen in office on 01/20/2021.  Past medical history significant for seasonal allergic rhinitis, GERD, anxiety and depression, hyper IgE syndrome, obesity.  Followed by allergist.   TEST/EVENTS:  10/21/2020 IgE: 1859 kU/L 01/04/2021 chest x-ray: Both lungs clear.  No active disease. 02/13/2021 spirometry: FEV1 1.73, ratio 84  01/20/2021: OV with Dr. Melvyn Novas.  Referred to allergist.  Breathing relatively stable.  Continued on Symbicort twice daily and albuterol as needed.  06/30/2021: Today - acute visit Patient presents today for reported URI symptoms and associated cough. Her symptoms started mid last week and have persisted since. She was seen in Urgent Care on Monday and treated conservatively with over the counter regimen.  Negative for COVID and flu.  She reports persistent cough with brown sputum production, nasal congestion, and rhinorrhea. She has some fatigue and her breathing is worse at night. She has noticed much wheezing, aside from night time. She denies fevers, orthopnea, chest pain, or lower extremity swelling. She continues on Symbicort Twice daily and has been using her albuterol neb a few times a day. She continues on her Fasenra injections as scheduled and reports that her breathing was stable before this illness.   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 DX: J45.40 75 mL 3   budesonide-formoterol (SYMBICORT) 160-4.5 MCG/ACT inhaler TAKE 2 PUFFS BY MOUTH TWICE A DAY 10.2 each 11   naproxen sodium (ALEVE) 220 MG tablet Take 220 mg by mouth.     nitrofurantoin, macrocrystal-monohydrate, (MACROBID) 100 MG capsule Take 1 capsule (100 mg total) by mouth 2 (two) times daily. X 7 days 14 capsule 0   phenazopyridine (PYRIDIUM) 200 MG tablet Take 1 tablet (200 mg total) by mouth 3 (three) times daily as needed (urinary pain). 6 tablet 0   Facility-Administered Medications Prior to Visit  Medication Dose Route Frequency Provider Last Rate Last Admin   Benralizumab SOSY 30 mg  30 mg Subcutaneous Q28 days Sigurd Sos, MD   30 mg at 05/08/21 1335     Review of Systems:   Constitutional: No weight loss or gain, night sweats, fevers, chills. +fatigue HEENT:  No headaches, difficulty swallowing, tooth/dental problems, or sore throat. No sneezing, itching, ear ache. +nasal congestion, rhinorrhea, sinus pressure/pain CV: +PND. No chest pain, orthopnea, swelling in lower extremities, anasarca, dizziness, palpitations, syncope Resp: +shortness of breath with exertion (mild increase from baseline); productive cough with brown sputum; occasional wheeze at night. No  hemoptysis. No chest wall deformity GI:  No heartburn, indigestion, abdominal pain, nausea, vomiting, diarrhea, change in bowel habits, loss of appetite, bloody stools.  GU: No dysuria, change in color of urine, urgency or frequency.  No flank pain, no hematuria  Skin: No rash, lesions, ulcerations MSK:  No joint pain or swelling.  No decreased range of motion.  No back pain. Neuro: No dizziness or lightheadedness.  Psych: No depression or anxiety. Mood stable.     Physical Exam:  BP 108/68 (BP Location: Left Arm, Patient Position: Sitting, Cuff Size: Large)    Pulse 91    Temp 97.9 F (36.6 C) (Oral)    Ht 5\' 8"  (1.727 m)    Wt 209 lb (94.8 kg)    LMP 06/19/2021 (Approximate)    SpO2 97%    BMI 31.78 kg/m   GEN: Pleasant, interactive, well-appearing; obese; in no acute distress HEENT:  Normocephalic and atraumatic. EACs patent bilaterally. TM pearly gray with present light reflex bilaterally. PERRLA. Sclera white. Nasal turbinates pink, moist and patent bilaterally. Clear rhinorrhea present. Oropharynx erythematous and moist, without exudate or edema. No lesions, ulcerations NECK:  Supple w/ fair ROM. No JVD present. Normal carotid impulses w/o bruits. Thyroid symmetrical with no goiter or nodules palpated. No lymphadenopathy.   CV: RRR, no m/r/g, no peripheral edema. Pulses intact, +2 bilaterally. No cyanosis, pallor or clubbing. PULMONARY:  Unlabored, regular breathing. Clear bilaterally A&P w/o wheezes/rales/rhonchi. No accessory muscle use. No dullness to percussion. GI: BS present and normoactive. Soft, non-tender to palpation. No organomegaly or masses detected. No CVA tenderness. MSK: No erythema, warmth or tenderness. Cap refil <2 sec all extrem. No deformities or joint swelling noted.  Neuro: A/Ox3. No focal deficits noted.   Skin: Warm, no lesions or rashe Psych: Normal affect and behavior. Judgement and thought content appropriate.     Lab Results:  CBC    Component Value  Date/Time   WBC 9.3 01/04/2021 1949   RBC 3.92 01/04/2021 1949   HGB 12.4 01/04/2021 1949   HGB 13.5 02/03/2018 1421   HCT 36.3 01/04/2021 1949   HCT 40.2 02/03/2018 1421   PLT 372 01/04/2021 1949   PLT 402 02/03/2018 1421   MCV 92.6 01/04/2021 1949   MCV 95 02/03/2018 1421   MCH 31.6 01/04/2021 1949   MCHC 34.2 01/04/2021 1949   RDW 12.4 01/04/2021 1949   RDW 13.7 02/03/2018 1421   LYMPHSABS 2.9 01/04/2021 1949   LYMPHSABS 2.0 02/03/2018 1421   MONOABS 0.8 01/04/2021 1949   EOSABS 0.4 01/04/2021 1949   EOSABS 0.2 02/03/2018 1421   BASOSABS 0.1 01/04/2021 1949   BASOSABS 0.0 02/03/2018 1421    BMET    Component Value Date/Time   NA 138 01/04/2021 1949   K 3.5 01/04/2021 1949   CL 105 01/04/2021 1949   CO2 25 01/04/2021 1949   GLUCOSE 97 01/04/2021 1949   BUN 9 01/04/2021 1949   CREATININE 0.70 01/04/2021 1949   CALCIUM 9.2 01/04/2021 1949   GFRNONAA >60 01/04/2021 1949   GFRAA >60 12/10/2014 0604    BNP    Component Value Date/Time   BNP 6.6 12/10/2014 0604  Imaging:  06/30/2021: CXR today reviewed by me. Both lungs clear. No acute cardiopulmonary disease.   DG Chest 2 View  Result Date: 06/30/2021 CLINICAL DATA:  Cough EXAM: CHEST - 2 VIEW COMPARISON:  01/04/2021 FINDINGS: The heart size and mediastinal contours are within normal limits. Both lungs are clear. The visualized skeletal structures are unremarkable except for similar scoliosis of the thoracic spine. IMPRESSION: No active cardiopulmonary disease. Electronically Signed   By: Jerilynn Mages.  Shick M.D.   On: 06/30/2021 10:02    Benralizumab SOSY 30 mg     Date Action Dose Route User   Discharged on 06/26/2021   Admitted on 06/26/2021   Discharged on 06/09/2021   Admitted on 06/09/2021   05/08/2021 1335 Given 30 mg Subcutaneous (Right Arm) Robie Ridge, CMA       No flowsheet data found.  Lab Results  Component Value Date   NITRICOXIDE 7 12/16/2017        Assessment & Plan:   Moderate  persistent asthma with (acute) exacerbation Flare related to URI. Stable appearance with clear breath sounds. Will tx with prednisone 40 mg for 5 days. Continue Symbicort Twice daily and albuterol nebs/inhaler PRN. CXR today clear.  Patient Instructions  Continue Symbicort 2 puffs Twice daily. Brush tongue and rinse mouth afterwards Continue Albuterol inhaler 2 puffs or 3 mL neb every 6 hours as needed for shortness of breath or wheezing. Notify if symptoms persist despite rescue inhaler/neb use. Continue Fasenra injections every 28 days as previously scheduled  -Prednisone 40 mg for 5 days. Take in the AM with food. -Doxycyline 100 mg Twice daily for 7 days. Notify immediately of any rash, itching, hives, or swelling, or seek emergency care.Finish your antibiotics in their entirety. Do not stop just because symptoms improve. Take with food to reduce GI upset. This medication causes photosensitivity - avoid direct sun exposure while on this medication.  -Flonase nasal spray 2 sprays each nostril daily then 1-2 sprays each nostril daily as needed for nasal congestion/runny nose  -Saline nasal spray 2-3 times a day as needed for nasal congestion/postnasal drip until symptoms improve -Tylenol cold and flu over the counter as directed as needed until symptoms improve -Chlortab 4mg  over the counter at night as needed for cough  Asthma Action Plan in place Rinse mouth after inhaled corticosteroid use.  Avoid triggers, when able.  Exercise encouraged. Notify if worsening symptoms upon exertion.  Notify and seek help if symptoms unrelieved by rescue inhaler.   Chest x ray today. We will notify you of any abnormal results.  Okay to return to work Monday.  Drink plenty of fluids and rest.   Follow up in 3 months with Dr. Melvyn Novas or Alanson Aly. If symptoms do not improve or worsen, please contact office for sooner follow up or seek emergency care.    Acute bacterial rhinosinusitis Given length  of symptoms >10 days, will tx for bacterial coinfection. Doxycycline 100 mg Twice daily for 7 days. Flonase nasal spray. Supportive care measures discussed and advised. See above.      Clayton Bibles, NP 06/30/2021  Pt aware and understands NP's role.

## 2021-06-30 NOTE — Patient Instructions (Addendum)
Continue Symbicort 2 puffs Twice daily. Brush tongue and rinse mouth afterwards Continue Albuterol inhaler 2 puffs or 3 mL neb every 6 hours as needed for shortness of breath or wheezing. Notify if symptoms persist despite rescue inhaler/neb use. Continue Fasenra injections every 28 days as previously scheduled  -Prednisone 40 mg for 5 days. Take in the AM with food. -Doxycyline 100 mg Twice daily for 7 days. Notify immediately of any rash, itching, hives, or swelling, or seek emergency care.Finish your antibiotics in their entirety. Do not stop just because symptoms improve. Take with food to reduce GI upset. This medication causes photosensitivity - avoid direct sun exposure while on this medication.  -Flonase nasal spray 2 sprays each nostril daily then 1-2 sprays each nostril daily as needed for nasal congestion/runny nose  -Saline nasal spray 2-3 times a day as needed for nasal congestion/postnasal drip until symptoms improve -Tylenol cold and flu over the counter as directed as needed until symptoms improve -Chlortab 4mg  over the counter at night as needed for cough  Asthma Action Plan in place Rinse mouth after inhaled corticosteroid use.  Avoid triggers, when able.  Exercise encouraged. Notify if worsening symptoms upon exertion.  Notify and seek help if symptoms unrelieved by rescue inhaler.   Chest x ray today. We will notify you of any abnormal results.  Okay to return to work Monday.  Drink plenty of fluids and rest.   Follow up in 3 months with Dr. Melvyn Novas or Alanson Aly. If symptoms do not improve or worsen, please contact office for sooner follow up or seek emergency care.

## 2021-06-30 NOTE — Assessment & Plan Note (Signed)
Given length of symptoms >10 days, will tx for bacterial coinfection. Doxycycline 100 mg Twice daily for 7 days. Flonase nasal spray. Supportive care measures discussed and advised. See above.

## 2021-06-30 NOTE — Assessment & Plan Note (Addendum)
Flare related to URI. Stable appearance with clear breath sounds. Will tx with prednisone 40 mg for 5 days. Continue Symbicort Twice daily and albuterol nebs/inhaler PRN. CXR today clear.  Patient Instructions  Continue Symbicort 2 puffs Twice daily. Brush tongue and rinse mouth afterwards Continue Albuterol inhaler 2 puffs or 3 mL neb every 6 hours as needed for shortness of breath or wheezing. Notify if symptoms persist despite rescue inhaler/neb use. Continue Fasenra injections every 28 days as previously scheduled  -Prednisone 40 mg for 5 days. Take in the AM with food. -Doxycyline 100 mg Twice daily for 7 days. Notify immediately of any rash, itching, hives, or swelling, or seek emergency care.Finish your antibiotics in their entirety. Do not stop just because symptoms improve. Take with food to reduce GI upset. This medication causes photosensitivity - avoid direct sun exposure while on this medication.  -Flonase nasal spray 2 sprays each nostril daily then 1-2 sprays each nostril daily as needed for nasal congestion/runny nose  -Saline nasal spray 2-3 times a day as needed for nasal congestion/postnasal drip until symptoms improve -Tylenol cold and flu over the counter as directed as needed until symptoms improve -Chlortab 4mg  over the counter at night as needed for cough  Asthma Action Plan in place Rinse mouth after inhaled corticosteroid use.  Avoid triggers, when able.  Exercise encouraged. Notify if worsening symptoms upon exertion.  Notify and seek help if symptoms unrelieved by rescue inhaler.   Chest x ray today. We will notify you of any abnormal results.  Okay to return to work Monday.  Drink plenty of fluids and rest.   Follow up in 3 months with Dr. Wednesday or Sherene Sires. If symptoms do not improve or worsen, please contact office for sooner follow up or seek emergency care.

## 2021-07-16 NOTE — Progress Notes (Deleted)
FOLLOW UP Date of Service/Encounter:  07/16/21   Subjective:  Crystal Sampson (DOB: 1976-06-22) is a 46 y.o. female PMHx of moderate persistent asthma, allergic rhinoconjunctivitis, obesity, GERD, anxiety and depression who returns to the [facility] on 07/17/2021 in re-evaluation of the following: severe persistent , alelrgic rihnitis History obtained from: chart review and {Persons; PED relatives w/patient:19415::"patient"}.  For Review, LV was on 02/13/21  with Dr.Delmas Faucett.  Not controlled on Symbicort 160, 2 puffs BID.  Seen on 06/30/21 by [facility] for asthma flare and given prednisone burst and doxycycline.  Pertinent history/diagnostics:  - Asthma: referred by Dr. Melvyn Novas (Pulmonary)  - onset in childhood  - 5-7 lifetime hospitalizations, 3 ED visits in 2022  - montelukat discontinued secondary to depression  - - Spirometry 12/16/2017 FEV1 2.14 (74%) Ratio 79 (Dr. Melvyn Novas)  - 02/13/21: spiro: 62% FEV1, 104% ratio  - Fasenra started 04/10/21 - Seasonal and perennial allergic rhinitis:   - SPT 02/13/21: positive to most grasses, trees, weeds, molds, dust mites, pet dander, cockroach    Allergies as of 07/17/2021       Reactions   Penicillins Swelling        Medication List        Accurate as of July 16, 2021  3:41 PM. If you have any questions, ask your nurse or doctor.          albuterol 108 (90 Base) MCG/ACT inhaler Commonly known as: ProAir HFA INHALE 2 PUFFS EVERY 4 HOURS AS NEEDED ONLY IF YOU CANT CATCH YOUR BREATH   albuterol (2.5 MG/3ML) 0.083% nebulizer solution Commonly known as: PROVENTIL USE 2 VIALS BY NEBULIZATION EVERY 6 (SIX) HOURS AS NEEDED FOR WHEEZING OR SHORTNESS OF BREATH. DX: J45.40   budesonide-formoterol 160-4.5 MCG/ACT inhaler Commonly known as: Symbicort TAKE 2 PUFFS BY MOUTH TWICE A DAY   doxycycline 100 MG tablet Commonly known as: VIBRA-TABS Take 1 tablet (100 mg total) by mouth 2 (two) times daily.   fluticasone  50 MCG/ACT nasal spray Commonly known as: FLONASE Place 1 spray into both nostrils daily.   naproxen sodium 220 MG tablet Commonly known as: ALEVE Take 220 mg by mouth.   nitrofurantoin (macrocrystal-monohydrate) 100 MG capsule Commonly known as: Macrobid Take 1 capsule (100 mg total) by mouth 2 (two) times daily. X 7 days   phenazopyridine 200 MG tablet Commonly known as: Pyridium Take 1 tablet (200 mg total) by mouth 3 (three) times daily as needed (urinary pain).       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  ROS: All others negative except as noted per HPI.   Objective:  LMP 06/19/2021 (Approximate)  There is no height or weight on file to calculate BMI. Physical Exam: General Appearance:  Alert, cooperative, no distress, appears stated age  Head:  Normocephalic, without obvious abnormality, atraumatic  Eyes:  Conjunctiva clear, EOM's intact  Nose: Nares normal, {Blank multiple:19196:a:"***","hypertrophic turbinates","normal mucosa","no visible anterior polyps","septum midline"}  Throat: Lips, tongue normal; teeth and gums normal, {Blank multiple:19196:a:"***","normal posterior oropharynx","tonsils 2+","tonsils 3+","no tonsillar exudate","+ cobblestoning"}  Neck: Supple, symmetrical  Lungs:   {Blank multiple:19196:a:"***","clear to auscultation bilaterally","end-expiratory wheezing","wheezing throughout"}, Respirations unlabored, {Blank multiple:19196:a:"***","no coughing","intermittent dry coughing"}  Heart:  {Blank multiple:19196:a:"***","regular rate and rhythm","no murmur"}, Appears well perfused  Extremities: No  edema  Skin: Skin color, texture, turgor normal, no rashes or lesions on visualized portions of skin  Neurologic: No gross deficits   Reviewed: ***  Spirometry:  Tracings reviewed. Her effort: {Blank single:19197::"Good reproducible efforts.","It was hard to get consistent efforts and there is a question as to whether this reflects a maximal maneuver.","Poor effort, data can not be interpreted.","Variable effort-results affected.","decent for first attempt at spirometry."} FVC: ***L FEV1: ***L, ***% predicted FEV1/FVC ratio: ***% Interpretation: {Blank single:19197::"Spirometry consistent with mild obstructive disease","Spirometry consistent with moderate obstructive disease","Spirometry consistent with severe obstructive disease","Spirometry consistent with possible restrictive disease","Spirometry consistent with mixed obstructive and restrictive disease","Spirometry uninterpretable due to technique","Spirometry consistent with normal pattern","No overt abnormalities noted given today's efforts"}.  Please see scanned spirometry results for details.  Skin Testing: {Blank single:19197::"Select foods","Environmental allergy panel","Environmental allergy panel and select foods","Food allergy panel","None","Deferred due to recent antihistamines use"}. Positive test to: ***. Negative test to: ***.  Results discussed with patient/family.   {Blank single:19197::"Allergy testing results were read and interpreted by myself, documented by clinical staff."," "}  Assessment/Plan  There are no Patient Instructions on file for this visit.  Sigurd Sos, MD  [facility]

## 2021-07-17 ENCOUNTER — Ambulatory Visit: Payer: Medicaid Other | Admitting: Internal Medicine

## 2021-07-17 ENCOUNTER — Ambulatory Visit (INDEPENDENT_AMBULATORY_CARE_PROVIDER_SITE_OTHER): Payer: Medicaid Other | Admitting: Internal Medicine

## 2021-07-17 ENCOUNTER — Encounter: Payer: Self-pay | Admitting: Internal Medicine

## 2021-07-17 ENCOUNTER — Other Ambulatory Visit: Payer: Self-pay

## 2021-07-17 VITALS — BP 118/66 | HR 63 | Resp 22

## 2021-07-17 DIAGNOSIS — J3089 Other allergic rhinitis: Secondary | ICD-10-CM

## 2021-07-17 DIAGNOSIS — J302 Other seasonal allergic rhinitis: Secondary | ICD-10-CM

## 2021-07-17 DIAGNOSIS — J4550 Severe persistent asthma, uncomplicated: Secondary | ICD-10-CM | POA: Diagnosis not present

## 2021-07-17 MED ORDER — ALBUTEROL SULFATE HFA 108 (90 BASE) MCG/ACT IN AERS: INHALATION_SPRAY | RESPIRATORY_TRACT | 3 refills | Status: DC

## 2021-07-17 MED ORDER — ALBUTEROL SULFATE (2.5 MG/3ML) 0.083% IN NEBU: INHALATION_SOLUTION | RESPIRATORY_TRACT | 3 refills | Status: DC

## 2021-07-17 MED ORDER — IPRATROPIUM BROMIDE 0.03 % NA SOLN: 2.0000 | Freq: Three times a day (TID) | NASAL | 12 refills | Status: DC

## 2021-07-17 NOTE — Progress Notes (Signed)
FOLLOW UP Date of Service/Encounter:  07/17/21   Subjective:  Crystal Sampson (DOB: 1975-11-06) is a 46 y.o. female who [facility] in re-evaluation of the following: severe persistent asthma, seasonal and perennial allergic rhinitis History obtained from: chart review and patient.  For Review, LV was on 02/13/21  with Dr.Reeve Turnley.  At that visit, we continued her Symbicort 160 and added Fasenra.  We also started her on a daily antihistamine as she was not taking anything for rhinitis at the time of her visit.  Pertinent history/diagnostics:  -Asthma:  -Followed by Dr. Sherene Sires pulmonary, 5-7 lifetime hospitalizations for asthma  -Stopped Singulair due to depression  -Started Fasenra on 04/10/2021 -Allergy profile 12/31/20 > Eos 0.3 / IgE 2259  [name] 04/2012: FEV1 1.72 (58%), ratio 68  - Spiro 02/13/21: 104% ratio, 62% FEV1 - ARC:   - SPT 02/13/21: + most grasses, trees, weeds, molds, dust mites, pet dander, cockroach -Has been on allergy shots in the past, stopped prior to reaching 5-year goal (over 5 years ago); didn't feel they made a huge difference  Today presents for follow-up. Since her last visit, she has been seen by pulmonary for acute rhinosinusitis and was prescribed doxycycline as well as a prednisone burst. She did start Harrington Challenger and felt that after her first shot she had increased sinus drainage.  This continued following her second injection.  She was concerned that this was potentially a side effect from the medication.  She treated herself with over-the-counter medications, but after 2 months of no improvement, finally saw her pulmonologist.Once she started doxycycline and prednisone, her symptoms improved.    She reports that her breathing is very well controlled.  When she had URI symptoms (about 2 months ago) was using her rescue inhaler every 4 to 6 hours for about 2-3 weeks.  However, has not needed to use at all in the past month.   She did miss her last Fasenra injection and is overdue by 1 month.  She is now using flonase 1 spray each side.  Not taking zyrtec, or Singulair.  Has history of reflux, and takes tums occasionally.   She would like to continue Fasenra injections.    Allergies as of 07/17/2021       Reactions   Penicillins Swelling        Medication List        Accurate as of July 17, 2021  5:37 PM. If you have any questions, ask your nurse or doctor.          STOP taking these medications    doxycycline 100 MG tablet Commonly known as: VIBRA-TABS Stopped by: Tonny Bollman, MD   montelukast 10 MG tablet Commonly known as: SINGULAIR Stopped by: Tonny Bollman, MD   nitrofurantoin (macrocrystal-monohydrate) 100 MG capsule Commonly known as: Macrobid Stopped by: Tonny Bollman, MD   phenazopyridine 200 MG tablet Commonly known as: Pyridium Stopped by: Tonny Bollman, MD       TAKE these medications    albuterol (2.5 MG/3ML) 0.083% nebulizer solution Commonly known as: PROVENTIL USE 2 VIALS BY NEBULIZATION EVERY 6 (SIX) HOURS AS NEEDED FOR WHEEZING OR SHORTNESS OF BREATH. DX: J45.40   albuterol 108 (90 Base) MCG/ACT inhaler Commonly known as: ProAir HFA INHALE 2 PUFFS EVERY 4 HOURS AS NEEDED ONLY IF YOU CANT CATCH YOUR BREATH   budesonide-formoterol 160-4.5 MCG/ACT inhaler Commonly known as: Symbicort TAKE 2 PUFFS BY MOUTH TWICE A DAY   fluticasone 50  MCG/ACT nasal spray Commonly known as: FLONASE Place 1 spray into both nostrils daily.   ipratropium 0.03 % nasal spray Commonly known as: ATROVENT Place 2 sprays into both nostrils 3 (three) times daily. Started by: Tonny Bollman, MD   naproxen sodium 220 MG tablet Commonly known as: ALEVE Take 220 mg by mouth.       Past Medical History:  Diagnosis Date   Asthma    Depression    goes to [facility] quit taking meds   HSV (herpes simplex virus) infection    last outbreak 2007   Past Surgical History:  Procedure  Laterality Date   NO PAST SURGERIES     WISDOM TOOTH EXTRACTION Bilateral    Otherwise, there have been no changes to her past medical history, surgical history, family history, or social history.  ROS: All others negative except as noted per HPI.   Objective:  BP 118/66    Pulse 63    Resp (!) 22    LMP 06/19/2021 (Approximate)    SpO2 96%  There is no height or weight on file to calculate BMI. Physical Exam: General Appearance:  Alert, cooperative, no distress, appears stated age  Head:  Normocephalic, without obvious abnormality, atraumatic  Eyes:  Conjunctiva clear, EOM's intact  Nose: Nares normal, hypertrophic turbinates, normal mucosa, and no visible anterior polyps  Throat: Lips, tongue normal; teeth and gums normal, normal posterior oropharynx and + cobblestoning  Neck: Supple, symmetrical  Lungs:   clear to auscultation bilaterally, Respirations unlabored, no coughing  Heart:  regular rate and rhythm and no murmur, Appears well perfused  Extremities: No edema  Skin: Skin color, texture, turgor normal, no rashes or lesions on visualized portions of skin  Neurologic: No gross deficits   Spirometry:  Tracings reviewed. Her effort: Good reproducible efforts. FVC: 1.92L FEV1: 1.38L, 49% predicted FEV1/FVC ratio: 89% Interpretation: Spirometry consistent with possible restrictive disease.  Please see scanned spirometry results for details.  Assessment/Plan   Patient Instructions  Severe Persistent Asthma:partially controlled - your breathing test today didn't look so great - Continue Symbicort 160 mg 2 puffs twice a day with a spacer; THIS SHOULD BE USED EVERY DAY - Rinse mouth out after use - Use Albuterol (Proair/Ventolin) 2 puffs every 4-6 hours as needed for chest tightness, wheezing, or coughing - Use Albuterol (Proair/Ventolin) 2 puffs 15 minutes prior to exercise if you have symptoms with activity - Use a spacer with all inhalers - please keep track of how often  you are needing rescue inhaler Albuterol (Proair/Ventolin) as this will help guide future management - Asthma is not controlled if:  - Symptoms are occurring >2 times a week OR  - >2 times a month nighttime awakenings  - Please call the clinic to schedule a follow up if these symptoms arise - continue Fasenra (benralizumab) - can take ibuprofen if needed for cold-like symptoms, symptomatic care, shouldn't last more than a few days - new nebulizer and spacer provided in clinic today   Seasonal and Perennial Allergic Rhinitis: controlled - continue flonase 1-2 sprays daily - atrovent (ipatropium) 2 sprays up to 3 times daily if needed for drainage, runny nose - add daily non-sedating antihistamine if needed -allergen avoidance (most grasses, trees, weeds, molds, dust mites, pet dander, cockroach)   Follow-up in 3 months, sooner if needed. It was a pleasure seeing you again in clinic today!  ------------------------------------------------ Tonny Bollman, MD [facility]     Tonny Bollman, MD  [facility]

## 2021-07-17 NOTE — Patient Instructions (Addendum)
Severe Persistent Asthma:partially controlled - your breathing test today didn't look so great - Continue Symbicort 160 mg 2 puffs twice a day with a spacer; THIS SHOULD BE USED EVERY DAY - Rinse mouth out after use - Use Albuterol (Proair/Ventolin) 2 puffs every 4-6 hours as needed for chest tightness, wheezing, or coughing - Use Albuterol (Proair/Ventolin) 2 puffs 15 minutes prior to exercise if you have symptoms with activity - Use a spacer with all inhalers - please keep track of how often you are needing rescue inhaler Albuterol (Proair/Ventolin) as this will help guide future management - Asthma is not controlled if:  - Symptoms are occurring >2 times a week OR  - >2 times a month nighttime awakenings  - Please call the clinic to schedule a follow up if these symptoms arise - continue Fasenra (benralizumab) - can take ibuprofen if needed for cold-like symptoms, symptomatic care, shouldn't last more than a few days - new nebulizer and spacer provided in clinic today   Seasonal and Perennial Allergic Rhinitis: controlled - continue flonase 1-2 sprays daily - atrovent (ipatropium) 2 sprays up to 3 times daily if needed for drainage, runny nose - add daily non-sedating antihistamine if needed -allergen avoidance (most grasses, trees, weeds, molds, dust mites, pet dander, cockroach)   Follow-up in 3 months, sooner if needed. It was a pleasure seeing you again in clinic today!  ------------------------------------------------ Crystal Sos, MD [facility]

## 2021-09-04 ENCOUNTER — Encounter: Payer: Self-pay | Admitting: Nurse Practitioner

## 2021-09-04 ENCOUNTER — Other Ambulatory Visit: Payer: Self-pay

## 2021-09-04 ENCOUNTER — Ambulatory Visit (INDEPENDENT_AMBULATORY_CARE_PROVIDER_SITE_OTHER): Payer: Medicaid Other | Admitting: Nurse Practitioner

## 2021-09-04 VITALS — BP 122/68 | HR 68 | Temp 98.0°F | Ht 68.0 in | Wt 210.8 lb

## 2021-09-04 DIAGNOSIS — D824 Hyperimmunoglobulin E [IgE] syndrome: Secondary | ICD-10-CM | POA: Diagnosis not present

## 2021-09-04 DIAGNOSIS — J4541 Moderate persistent asthma with (acute) exacerbation: Secondary | ICD-10-CM

## 2021-09-04 LAB — CBC WITH DIFFERENTIAL/PLATELET
Basophils Absolute: 0 10*3/uL (ref 0.0–0.1)
Basophils Relative: 0.2 % (ref 0.0–3.0)
Eosinophils Absolute: 0 10*3/uL (ref 0.0–0.7)
Eosinophils Relative: 0 % (ref 0.0–5.0)
HCT: 38.3 % (ref 36.0–46.0)
Hemoglobin: 12.9 g/dL (ref 12.0–15.0)
Lymphocytes Relative: 35 % (ref 12.0–46.0)
Lymphs Abs: 2.7 10*3/uL (ref 0.7–4.0)
MCHC: 33.6 g/dL (ref 30.0–36.0)
MCV: 93.1 fl (ref 78.0–100.0)
Monocytes Absolute: 0.5 10*3/uL (ref 0.1–1.0)
Monocytes Relative: 6.9 % (ref 3.0–12.0)
Neutro Abs: 4.4 10*3/uL (ref 1.4–7.7)
Neutrophils Relative %: 57.9 % (ref 43.0–77.0)
Platelets: 354 10*3/uL (ref 150.0–400.0)
RBC: 4.11 Mil/uL (ref 3.87–5.11)
RDW: 13.3 % (ref 11.5–15.5)
WBC: 7.6 10*3/uL (ref 4.0–10.5)

## 2021-09-04 MED ORDER — PREDNISONE 20 MG PO TABS: 40.0000 mg | ORAL_TABLET | Freq: Every day | ORAL | 0 refills | Status: AC

## 2021-09-04 MED ORDER — BENZONATATE 200 MG PO CAPS: 200.0000 mg | ORAL_CAPSULE | Freq: Three times a day (TID) | ORAL | 1 refills | Status: DC | PRN

## 2021-09-04 NOTE — Assessment & Plan Note (Addendum)
Worsening SOB and wheezing with increased rescue inhaler use. Previous flare in January with similar symptoms. Short prednisone burst today. Cough control. Continue postnasal drainage control with atrovent and flonase. CBC with diff today to assess eos. Will hold off on imaging unless symptoms worsen or do not improve.  ? ?Patient Instructions  ?Continue Symbicort 2 puffs Twice daily. Brush tongue and rinse mouth afterwards ?Continue Albuterol inhaler 2 puffs or 3 mL neb every 6 hours as needed for shortness of breath or wheezing. Notify if symptoms persist despite rescue inhaler/neb use. ?Continue Fasenra injections every 28 days as previously scheduled ?-Continue Flonase nasal spray 2 sprays each nostril daily then 1-2 sprays each nostril daily as needed for nasal congestion/runny nose ?  ?-Prednisone 40 mg for 5 days. Take in the AM with food. ?-Chlorpheniramine 4mg  over the counter at night as needed for cough ?-Tessalon perles 1 capsule every 8 hours as needed for cough  ?-Continue the mucinex DM syrup you have at home as directed on bottle ? ?Labs today - CBC with diff and IgE ?  ?Follow up in 3 months with Dr. or Sherene Sires. If symptoms do not improve or worsen, please contact office for sooner follow up or seek emergency care. ?  ? ? ?

## 2021-09-04 NOTE — Progress Notes (Signed)
@Patient  ID: , female    DOB: 1975-12-22, 46 y.o.   MRN: [id number]  Chief Complaint  Patient presents with   Acute Visit    Pt states she has been using her albuterol inhaler at least 3-6 times a day recently. States she has had an occasional cough and has been coughing up phlegm.    Referring provider: No ref. provider found  HPI: 46 year old female, never smoker followed for moderate persistent asthma.  She is a patient of Dr. 59 and was last seen in office on 06/30/2021 by [facility] NP.  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  Followed by allergist.  TEST/EVENTS:  10/21/2020 IgE: 1859 kU/L 02/13/2021 spirometry: FEV1 1.73, ratio 84 06/30/2021 CXR 2 view: Both lungs were clear.  There was no evidence of active cardiopulmonary disease  01/20/2021: OV with Dr. 01/22/2021.  Referred to allergist.  Breathing relatively stable.  Continued on Symbicort twice daily and albuterol as needed.  06/30/2021: OV with Jan Olano NP for URI symptoms and associated cough.  Negative for COVID and flu at urgent care.  Continue to have a persistent cough with brown sputum production, nasal congestion rhinorrhea.  Treated for mild asthma exacerbation and rhinosinusitis with 7-day Doxy course and prednisone burst.  Supportive care advised as well.  CXR was clear without any evidence of superimposed infection  09/04/2021: Today-acute visit Patient presents today for worsening cough and shortness of breath.  She states that over the past week she has had a productive cough that is occasionally with brown sputum and increased shortness of breath with exertion and coughing spells.  She has noticed an occasional wheeze, especially at night.  She has been using her albuterol inhaler 3-6 times a day recently.  She does continue on her Symbicort twice daily.  She denies any allergy type symptoms, recent fevers, orthopnea, PND,  chest pain or lower extremity swelling.  She continues on her Fasenra injections but does not feel as though they are working as well as they used to.  She did tell her allergist this to requested that they wait and see if this changes.  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 DX: J45.40 75 mL 3   budesonide-formoterol (SYMBICORT) 160-4.5 MCG/ACT inhaler TAKE 2 PUFFS BY MOUTH TWICE A DAY 10.2 each 11   fluticasone (FLONASE) 50 MCG/ACT nasal spray Place 1 spray into both nostrils daily. 18.2 mL 2   ipratropium (  ATROVENT) 0.03 % nasal spray Place 2 sprays into both nostrils 3 (three) times daily. 30 mL 12   naproxen sodium (ALEVE) 220 MG tablet Take 220 mg by mouth.     Facility-Administered Medications Prior to Visit  Medication Dose Route Frequency Provider Last Rate Last Admin   Benralizumab SOSY 30 mg  30 mg Subcutaneous Q28 days Tonny Bollmanennis, Erin, MD   30 mg at 07/17/21 1625     Review of Systems:   Constitutional: No  weight loss or gain, night sweats, fevers, chills, fatigue, or lassitude. HEENT: No headaches, difficulty swallowing, tooth/dental problems, or sore throat. No sneezing, itching, ear ache, nasal congestion, or post nasal drip CV:  No chest pain, orthopnea, PND, swelling in lower extremities, anasarca, dizziness, palpitations, syncope Resp: +shortness of breath with exertion; productive cough; wheezing. No hemoptysis.No chest wall deformity Skin: No rash, lesions, ulcerations Neuro: No dizziness or lightheadedness.  Psych: No depression or anxiety. Mood stable.     Physical Exam:  BP 122/68 (BP Location: Left Arm, Patient Position: Sitting, Cuff Size: Large)    Pulse 68    Temp 98 F (36.7 C) (Oral)    Ht 5\' 8"  (1.727 m)    Wt 210 lb 12.8 oz (95.6 kg)    SpO2 98% Comment: RA   BMI 32.05 kg/m   GEN: Pleasant, interactive, well-appearing; obese; in no acute distress. HEENT:  Normocephalic and atraumatic. EACs patent bilaterally. TM pearly gray with present light reflex bilaterally. PERRLA. Sclera white. Nasal turbinates pink, moist and patent bilaterally. No rhinorrhea present. Oropharynx pink and moist, without exudate or edema. No lesions, ulcerations, or postnasal drip.  NECK:  Supple w/ fair ROM. No JVD present. Normal carotid impulses w/o bruits. Thyroid symmetrical with no goiter or nodules palpated. No lymphadenopathy.   CV: RRR, no m/r/g, no peripheral edema. Pulses intact, +2 bilaterally. No cyanosis, pallor or clubbing. PULMONARY:  Unlabored, regular breathing. Clear bilaterally A&P w/o wheezes/rales/rhonchi. No accessory muscle use. No dullness to percussion. GI: BS present and normoactive. Soft, non-tender to palpation.  Neuro: A/Ox3. No focal deficits noted.   Skin: Warm, no lesions or rashes Psych: Normal affect and behavior. Judgement and thought content appropriate.     Lab Results:  CBC    Component Value Date/Time   WBC 7.6 09/04/2021 1602   RBC 4.11 09/04/2021 1602    HGB 12.9 09/04/2021 1602   HGB 13.5 02/03/2018 1421   HCT 38.3 09/04/2021 1602   HCT 40.2 02/03/2018 1421   PLT 354.0 09/04/2021 1602   PLT 402 02/03/2018 1421   MCV 93.1 09/04/2021 1602   MCV 95 02/03/2018 1421   MCH 31.6 01/04/2021 1949   MCHC 33.6 09/04/2021 1602   RDW 13.3 09/04/2021 1602   RDW 13.7 02/03/2018 1421   LYMPHSABS 2.7 09/04/2021 1602   LYMPHSABS 2.0 02/03/2018 1421   MONOABS 0.5 09/04/2021 1602   EOSABS 0.0 09/04/2021 1602   EOSABS 0.2 02/03/2018 1421   BASOSABS 0.0 09/04/2021 1602   BASOSABS 0.0 02/03/2018 1421    BMET    Component Value Date/Time   NA 138 01/04/2021 1949   K 3.5 01/04/2021 1949   CL 105 01/04/2021 1949   CO2 25 01/04/2021 1949   GLUCOSE 97 01/04/2021 1949   BUN 9 01/04/2021 1949   CREATININE 0.70 01/04/2021 1949   CALCIUM 9.2 01/04/2021 1949   GFRNONAA >60 01/04/2021 1949   GFRAA >60 12/10/2014 0604    BNP    Component Value Date/Time   BNP 6.6  12/10/2014 0604     Imaging:  No results found.  Benralizumab SOSY 30 mg     Date Action Dose Route User   07/17/2021 1625 Given 30 mg Subcutaneous (Right Arm) Maryjean Morn D, CMA       No flowsheet data found.  Lab Results  Component Value Date   NITRICOXIDE 7 12/16/2017        Assessment & Plan:   Moderate persistent asthma with (acute) exacerbation Worsening SOB and wheezing with increased rescue inhaler use. Previous flare in January with similar symptoms. Short prednisone burst today. Cough control. Continue postnasal drainage control with atrovent and flonase. CBC with diff today to assess eos. Will hold off on imaging unless symptoms worsen or do not improve.   Patient Instructions  Continue Symbicort 2 puffs Twice daily. Brush tongue and rinse mouth afterwards Continue Albuterol inhaler 2 puffs or 3 mL neb every 6 hours as needed for shortness of breath or wheezing. Notify if symptoms persist despite rescue inhaler/neb use. Continue Fasenra injections  every 28 days as previously scheduled -Continue Flonase nasal spray 2 sprays each nostril daily then 1-2 sprays each nostril daily as needed for nasal congestion/runny nose   -Prednisone 40 mg for 5 days. Take in the AM with food. -Chlorpheniramine 4mg  over the counter at night as needed for cough -Tessalon perles 1 capsule every 8 hours as needed for cough  -Continue the mucinex DM syrup you have at home as directed on bottle  Labs today - CBC with diff and IgE   Follow up in 3 months with Dr. or Katie Jaunita Mikels,NP. If symptoms do not improve or worsen, please contact office for sooner follow up or seek emergency care.      Hyper-IgE syndrome (HCC) Feels as though Sherene Sires is not helping. Check CBC with diff and IgE today.   I spent 32 minutes of dedicated to the care of this patient on the date of this encounter to include pre-visit review of records, face-to-face time with the patient discussing conditions above, post visit ordering of testing, clinical documentation with the electronic health record, making appropriate referrals as documented, and communicating necessary findings to members of the patients care team.  Harrington Challenger, NP 09/04/2021  Pt aware and understands NP's role.

## 2021-09-04 NOTE — Assessment & Plan Note (Signed)
Feels as though Harrington Challenger is not helping. Check CBC with diff and IgE today. ?

## 2021-09-04 NOTE — Patient Instructions (Addendum)
Continue Symbicort 2 puffs Twice daily. Brush tongue and rinse mouth afterwards ?Continue Albuterol inhaler 2 puffs or 3 mL neb every 6 hours as needed for shortness of breath or wheezing. Notify if symptoms persist despite rescue inhaler/neb use. ?Continue Fasenra injections every 28 days as previously scheduled ?-Continue Flonase nasal spray 2 sprays each nostril daily then 1-2 sprays each nostril daily as needed for nasal congestion/runny nose ?  ?-Prednisone 40 mg for 5 days. Take in the AM with food. ?-Chlorpheniramine 4mg  over the counter at night as needed for cough ?-Tessalon perles 1 capsule every 8 hours as needed for cough  ?-Continue the mucinex DM syrup you have at home as directed on bottle ? ?Labs today - CBC with diff and IgE ?  ?Follow up in 3 months with Dr. or Sherene Sires. If symptoms do not improve or worsen, please contact office for sooner follow up or seek emergency care. ?  ?

## 2021-09-05 LAB — IGE: IgE (Immunoglobulin E), Serum: 1749 kU/L — ABNORMAL HIGH (ref <=114)

## 2021-09-06 ENCOUNTER — Telehealth: Payer: Self-pay | Admitting: *Deleted

## 2021-09-06 NOTE — Telephone Encounter (Signed)
Left a message for a return call to schedule an office visit.  ?

## 2021-09-06 NOTE — Telephone Encounter (Signed)
-----   Message from Sigurd Sos, MD sent at 09/06/2021  1:20 PM EDT ----- ?I received a message from [facility] pulmonologist stating that her asthma is uncontrolled despite starting Berna Bue and asking that we consider another biologic.  Can we please get her scheduled for a follow-up in [place] to discuss?  Thank ? ?

## 2021-09-06 NOTE — Progress Notes (Signed)
IgE is still significantly elevated. Advise pt I will send a message to her allergist and have her follow up with them. Thanks!

## 2021-09-07 NOTE — Telephone Encounter (Signed)
Pt returned nurse call, scheduled appt for 3/20 ?

## 2021-09-11 ENCOUNTER — Ambulatory Visit: Payer: Medicaid Other | Admitting: Internal Medicine

## 2021-09-11 ENCOUNTER — Ambulatory Visit: Payer: Medicaid Other

## 2021-09-14 NOTE — Progress Notes (Deleted)
? ?FOLLOW UP ?Date of Service/Encounter:  09/14/21 ? ? ?Subjective:  ?Crystal Sampson (DOB: 1976-02-13) is a 46 y.o. female who returns to the [facility] [place] on 09/15/2021 in re-evaluation of the following: severe persistent asthma, seasonal and perennial allergic rhinitis ?History obtained from: chart review and {Persons; PED relatives w/patient:[id number]::"patient"}. ? ?For Review, LV was on 07/17/2021 with Dr.Amyr Sluder.  FEV1 is 49% at last visit. ? ?Since her last visit, she has had several flares of her asthma while on Fasenra.  She has needed several courses of steroids.  Her AEC was 0 on recent lab testing 09/04/2021.  Her IgE remains elevated at 1749. ? ?Pertinent history/diagnostics:  ?-Asthma: ?               -Followed by Dr. Melvyn Novas pulmonary, 5-7 lifetime hospitalizations for asthma ?               -Stopped Singulair due to depression ?               -Started Fasenra on 04/10/2021 ?-Allergy profile 12/31/20 > Eos 0.3 / IgE 2259  ?-Spiro 04/2012: FEV1 1.72 (58%), ratio 68 ?               Arlyce Harman 02/13/21: 104% ratio, 62% FEV1 ?- ARC:  ?               - SPT 02/13/21: + most grasses, trees, weeds, molds, dust mites, pet dander, cockroach ?-Has been on allergy shots in the past, stopped prior to reaching 5-year goal (over 5 years ago); didn't feel they made a huge difference ? ? ? ?Allergies as of 09/15/2021   ? ?   Reactions  ? Penicillins Swelling  ? ?  ? ?  ?Medication List  ?  ? ?  ? Accurate as of September 14, 2021  6:37 PM. If you have any questions, ask your nurse or doctor.  ?  ?  ? ?  ? ?albuterol (2.5 MG/3ML) 0.083% nebulizer solution ?Commonly known as: PROVENTIL ?USE 2 VIALS BY NEBULIZATION EVERY 6 (SIX) HOURS AS NEEDED FOR WHEEZING OR SHORTNESS OF BREATH. DX: J45.40 ?  ?albuterol 108 (90 Base) MCG/ACT inhaler ?Commonly known as: ProAir HFA ?INHALE 2 PUFFS EVERY 4 HOURS AS NEEDED ONLY IF YOU CANT CATCH YOUR BREATH ?  ?benzonatate 200 MG capsule ?Commonly known as: TESSALON ?Take 1 capsule (200 mg  total) by mouth 3 (three) times daily as needed for cough. ?  ?budesonide-formoterol 160-4.5 MCG/ACT inhaler ?Commonly known as: Symbicort ?TAKE 2 PUFFS BY MOUTH TWICE A DAY ?  ?fluticasone 50 MCG/ACT nasal spray ?Commonly known as: FLONASE ?Place 1 spray into both nostrils daily. ?  ?ipratropium 0.03 % nasal spray ?Commonly known as: ATROVENT ?Place 2 sprays into both nostrils 3 (three) times daily. ?  ?naproxen sodium 220 MG tablet ?Commonly known as: ALEVE ?Take 220 mg by mouth. ?  ? ?  ? ?Past Medical History:  ?Diagnosis Date  ? Asthma   ? Depression   ? goes to [facility], quit taking meds  ? HSV (herpes simplex virus) infection   ? last outbreak 2007  ? ?Past Surgical History:  ?Procedure Laterality Date  ? NO PAST SURGERIES    ? WISDOM TOOTH EXTRACTION Bilateral   ? ?Otherwise, there have been no changes to her past medical history, surgical history, family history, or social history. ? ?ROS: All others negative except as noted per HPI.  ? ?Objective:  ?There were no vitals taken for  this visit. ?There is no height or weight on file to calculate BMI. ?Physical Exam: ?General Appearance:  Alert, cooperative, no distress, appears stated age  ?Head:  Normocephalic, without obvious abnormality, atraumatic  ?Eyes:  Conjunctiva clear, EOM's intact  ?Nose: Nares normal, {Blank multiple:19196:a:"***","hypertrophic turbinates","normal mucosa","no visible anterior polyps","septum midline"}  ?Throat: Lips, tongue normal; teeth and gums normal, {Blank multiple:19196:a:"***","normal posterior oropharynx","tonsils 2+","tonsils 3+","no tonsillar exudate","+ cobblestoning"}  ?Neck: Supple, symmetrical  ?Lungs:   {Blank multiple:19196:a:"***","clear to auscultation bilaterally","end-expiratory wheezing","wheezing throughout"}, Respirations unlabored, {Blank multiple:19196:a:"***","no coughing","intermittent dry coughing"}  ?Heart:  {Blank multiple:19196:a:"***","regular rate and rhythm","no murmur"}, Appears well perfused   ?Extremities: No edema  ?Skin: Skin color, texture, turgor normal, no rashes or lesions on visualized portions of skin  ?Neurologic: No gross deficits  ? ?Reviewed: *** ? ?Spirometry:  ?Tracings reviewed. Her effort: {Blank single:19197::"Good reproducible efforts.","It was hard to get consistent efforts and there is a question as to whether this reflects a maximal maneuver.","Poor effort, data can not be interpreted.","Variable effort-results affected.","decent for first attempt at spirometry."} ?FVC: ***L ?FEV1: ***L, ***% predicted ?FEV1/FVC ratio: ***% ?Interpretation: {Blank single:19197::"Spirometry consistent with mild obstructive disease","Spirometry consistent with moderate obstructive disease","Spirometry consistent with severe obstructive disease","Spirometry consistent with possible restrictive disease","Spirometry consistent with mixed obstructive and restrictive disease","Spirometry uninterpretable due to technique","Spirometry consistent with normal pattern","No overt abnormalities noted given today's efforts"}.  ?Please see scanned spirometry results for details. ? ?Skin Testing: {Blank single:19197::"Select foods","Environmental allergy panel","Environmental allergy panel and select foods","Food allergy panel","None","Deferred due to recent antihistamines use"}. ?Positive test to: ***. Negative test to: ***.  ?Results discussed with patient/family. ? ? ?{Blank single:19197::"Allergy testing results were read and interpreted by myself, documented by clinical staff."," "} ? ?Assessment/Plan  ? ?*** ? ?Sigurd Sos, MD  ?[facility] ? ? ? ? ? ? ?

## 2021-09-15 ENCOUNTER — Ambulatory Visit: Payer: Medicaid Other | Admitting: Internal Medicine

## 2021-09-15 NOTE — Progress Notes (Deleted)
? ?FOLLOW UP ?Date of Service/Encounter:  09/15/21 ? ? ?Subjective:  ?Crystal Sampson (DOB: 08-06-1975) is a 46 y.o. female who [facility] in re-evaluation of the following: Severe persistent asthma, seasonal and perennial allergic rhinitis ?History obtained from: chart review and {Persons; PED relatives w/patient:[id number]::"patient"}. ? ?For Review, LV was on 07/17/2021 with Dr.Danna Casella.  At that visit, there was some concern about whether Harrington Challenger was helping or not.  She had been prescribed a course of prednisone from her pulmonologist for rhinosinusitis.  She also noted an increased drainage following Fasenra injections.  Her FEV1 at last visit was 49%. ? ?Since her last visit, provider has received messages from patient's pulmonary team stating that she has been seen multiple times at their office requiring prednisone for asthma flares since her last visit.  On 09/04/2021 a CBC differential was checked and showed 0 eosinophils though her IgE remains elevated (1749). ? ?Today presents for follow-up. ? ?Pertinent history/diagnostics:  ?-Asthma: ?               -Followed by Dr. Sherene Sires pulmonary, 5-7 lifetime hospitalizations for asthma ?               -Stopped Singulair due to depression ?               -Started Fasenra on 04/10/2021 ?-Allergy profile 12/31/20 > Eos 0.3 / IgE 2259  ?-Spiro 04/2012: FEV1 1.72 (58%), ratio 68 ?               [name] 02/13/21: 104% ratio, 62% FEV1 ?- ARC:  ?               - SPT 02/13/21: + most grasses, trees, weeds, molds, dust mites, pet dander, cockroach ?-Has been on allergy shots in the past, stopped prior to reaching 5-year goal (over 5 years ago); didn't feel they made a huge difference ? ?Allergies as of 09/18/2021   ? ?   Reactions  ? Penicillins Swelling  ? ?  ? ?  ?Medication List  ?  ? ?  ? Accurate as of September 15, 2021  1:50 PM. If you have any questions, ask your nurse or doctor.  ?  ?  ? ?  ? ?albuterol (2.5 MG/3ML) 0.083% nebulizer  solution ?Commonly known as: PROVENTIL ?USE 2 VIALS BY NEBULIZATION EVERY 6 (SIX) HOURS AS NEEDED FOR WHEEZING OR SHORTNESS OF BREATH. DX: J45.40 ?  ?albuterol 108 (90 Base) MCG/ACT inhaler ?Commonly known as: ProAir HFA ?INHALE 2 PUFFS EVERY 4 HOURS AS NEEDED ONLY IF YOU CANT CATCH YOUR BREATH ?  ?benzonatate 200 MG capsule ?Commonly known as: TESSALON ?Take 1 capsule (200 mg total) by mouth 3 (three) times daily as needed for cough. ?  ?budesonide-formoterol 160-4.5 MCG/ACT inhaler ?Commonly known as: Symbicort ?TAKE 2 PUFFS BY MOUTH TWICE A DAY ?  ?fluticasone 50 MCG/ACT nasal spray ?Commonly known as: FLONASE ?Place 1 spray into both nostrils daily. ?  ?ipratropium 0.03 % nasal spray ?Commonly known as: ATROVENT ?Place 2 sprays into both nostrils 3 (three) times daily. ?  ?naproxen sodium 220 MG tablet ?Commonly known as: ALEVE ?Take 220 mg by mouth. ?  ? ?  ? ?Past Medical History:  ?Diagnosis Date  ? Asthma   ? Depression   ? goes to [facility], quit taking meds  ? HSV (herpes simplex virus) infection   ? last outbreak 2007  ? ?Past Surgical History:  ?Procedure Laterality Date  ?  NO PAST SURGERIES    ? WISDOM TOOTH EXTRACTION Bilateral   ? ?Otherwise, there have been no changes to her past medical history, surgical history, family history, or social history. ? ?ROS: All others negative except as noted per HPI.  ? ?Objective:  ?There were no vitals taken for this visit. ?There is no height or weight on file to calculate BMI. ?Physical Exam: ?General Appearance:  Alert, cooperative, no distress, appears stated age  ?Head:  Normocephalic, without obvious abnormality, atraumatic  ?Eyes:  Conjunctiva clear, EOM's intact  ?Nose: Nares normal, {Blank multiple:19196:a:"***","hypertrophic turbinates","normal mucosa","no visible anterior polyps","septum midline"}  ?Throat: Lips, tongue normal; teeth and gums normal, {Blank multiple:19196:a:"***","normal posterior oropharynx","tonsils 2+","tonsils 3+","no tonsillar  exudate","+ cobblestoning"}  ?Neck: Supple, symmetrical  ?Lungs:   {Blank multiple:19196:a:"***","clear to auscultation bilaterally","end-expiratory wheezing","wheezing throughout"}, Respirations unlabored, {Blank multiple:19196:a:"***","no coughing","intermittent dry coughing"}  ?Heart:  {Blank multiple:19196:a:"***","regular rate and rhythm","no murmur"}, Appears well perfused  ?Extremities: No edema  ?Skin: Skin color, texture, turgor normal, no rashes or lesions on visualized portions of skin  ?Neurologic: No gross deficits  ? ?Reviewed: *** ? ?Spirometry:  ?Tracings reviewed. Her effort: {Blank single:19197::"Good reproducible efforts.","It was hard to get consistent efforts and there is a question as to whether this reflects a maximal maneuver.","Poor effort, data can not be interpreted.","Variable effort-results affected.","decent for first attempt at spirometry."} ?FVC: ***L ?FEV1: ***L, ***% predicted ?FEV1/FVC ratio: ***% ?Interpretation: {Blank single:19197::"Spirometry consistent with mild obstructive disease","Spirometry consistent with moderate obstructive disease","Spirometry consistent with severe obstructive disease","Spirometry consistent with possible restrictive disease","Spirometry consistent with mixed obstructive and restrictive disease","Spirometry uninterpretable due to technique","Spirometry consistent with normal pattern","No overt abnormalities noted given today's efforts"}.  ?Please see scanned spirometry results for details. ? ?Skin Testing: {Blank single:19197::"Select foods","Environmental allergy panel","Environmental allergy panel and select foods","Food allergy panel","None","Deferred due to recent antihistamines use"}. ?Positive test to: ***. Negative test to: ***.  ?Results discussed with patient/family. ? ? ?{Blank single:19197::"Allergy testing results were read and interpreted by myself, documented by clinical staff."," "} ? ?Assessment/Plan  ? ?*** ? ?Tonny Bollman, MD   ?[facility] ? ? ? ? ? ? ?

## 2021-09-18 ENCOUNTER — Ambulatory Visit: Payer: Medicaid Other | Admitting: Internal Medicine

## 2021-09-25 ENCOUNTER — Ambulatory Visit: Payer: Medicaid Other | Admitting: Internal Medicine

## 2021-10-02 ENCOUNTER — Ambulatory Visit: Payer: Medicaid Other | Admitting: Nurse Practitioner

## 2021-11-02 ENCOUNTER — Ambulatory Visit (INDEPENDENT_AMBULATORY_CARE_PROVIDER_SITE_OTHER): Payer: Medicaid Other | Admitting: Nurse Practitioner

## 2021-11-02 ENCOUNTER — Encounter: Payer: Self-pay | Admitting: Nurse Practitioner

## 2021-11-02 VITALS — BP 128/76 | HR 85 | Temp 98.2°F | Ht 68.0 in | Wt 211.0 lb

## 2021-11-02 DIAGNOSIS — J4541 Moderate persistent asthma with (acute) exacerbation: Secondary | ICD-10-CM | POA: Diagnosis not present

## 2021-11-02 DIAGNOSIS — G4719 Other hypersomnia: Secondary | ICD-10-CM | POA: Diagnosis not present

## 2021-11-02 DIAGNOSIS — R0683 Snoring: Secondary | ICD-10-CM

## 2021-11-02 DIAGNOSIS — D824 Hyperimmunoglobulin E [IgE] syndrome: Secondary | ICD-10-CM

## 2021-11-02 DIAGNOSIS — J4540 Moderate persistent asthma, uncomplicated: Secondary | ICD-10-CM

## 2021-11-02 DIAGNOSIS — J3089 Other allergic rhinitis: Secondary | ICD-10-CM

## 2021-11-02 DIAGNOSIS — J302 Other seasonal allergic rhinitis: Secondary | ICD-10-CM

## 2021-11-02 LAB — POCT EXHALED NITRIC OXIDE: FeNO level (ppb): 23

## 2021-11-02 MED ORDER — BREZTRI AEROSPHERE 160-9-4.8 MCG/ACT IN AERO: 2.0000 | INHALATION_SPRAY | Freq: Two times a day (BID) | RESPIRATORY_TRACT | 0 refills | Status: DC

## 2021-11-02 MED ORDER — PREDNISONE 20 MG PO TABS: 20.0000 mg | ORAL_TABLET | Freq: Every day | ORAL | 0 refills | Status: AC

## 2021-11-02 NOTE — Assessment & Plan Note (Addendum)
Persistently elevated IgE.  Felt like Harrington Challenger did not work as well as it used to.  Has not been taking injections recently.  Patient requested referral to a new allergist -placed today. ?

## 2021-11-02 NOTE — Assessment & Plan Note (Signed)
Progressive DOE.  Nitric oxide was not elevated today and no evidence of bronchospasm on exam.  Did provide with a prednisone burst that she can have on hand if she develops worsening shortness of breath or wheezing.  Advised that we trial a step up to triple therapy to see if this helps her breathing.  Provided with samples of Breztri today.  Continue albuterol as needed. ? ?Patient Instructions  ?Stop Symbicort. Trial Breztri 2 puffs Twice daily. Brush tongue and rinse mouth afterwards  ?Continue Albuterol inhaler 2 puffs or 3 mL neb every 6 hours as needed for shortness of breath or wheezing. Notify if symptoms persist despite rescue inhaler/neb use. ?Continue flonase 2 sprays each nostril daily  ?Continue atrovent 2 sprays each nostril Twice daily  ? ?I will send in a prednisone burst for you to have on hand if your breathing does not get better with the [facility]. ? ?Split night sleep study ordered - someone will contact you for scheduling. ? ?Follow up after HST with Katie Debora Stockdale,NP. If symptoms do not improve or worsen, please contact office for sooner follow up or seek emergency care. ? ? ?

## 2021-11-02 NOTE — Progress Notes (Signed)
? ?@Patient  ID: Crystal Sampson, female    DOB: 1975/12/21, 46 y.o.   MRN: [id number] ? ?Chief Complaint  ?Patient presents with  ? Follow-up  ?  Pt states for the past few weeks she is having issues with her SOB. She is on Symbicort and Flonase. She states she is using the Albuterol a lot more then she normally does. She states she is out of breath more at night.   ? ? ?Referring provider: ?No ref. provider found ? ?HPI: ?46 year old female, never smoker followed for moderate persistent asthma.  She is a patient of Dr. Gustavus Bryant and was last seen in office on 06/30/2021 by [facility] NP.  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  Followed by allergist. ? ?TEST/EVENTS:  ?10/21/2020 IgE: 1859 kU/L ?02/13/2021 spirometry: FEV1 1.73, ratio 84 ?06/30/2021 CXR 2 view: Both lungs were clear.  There was no evidence of active cardiopulmonary disease ?09/04/2021: Eos 0, IgE 1749; FeNO 19 ppb ? ?01/20/2021: OV with Dr. Melvyn Novas.  Referred to allergist.  Breathing relatively stable.  Continued on Symbicort twice daily and albuterol as needed. ? ?06/30/2021: OV with Ayjah Show NP for URI symptoms and associated cough.  Negative for COVID and flu at urgent care.  Continue to have a persistent cough with brown sputum production, nasal congestion rhinorrhea.  Treated for mild asthma exacerbation and rhinosinusitis with 7-day Doxy course and prednisone burst.  Supportive care advised as well.  CXR was clear without any evidence of superimposed infection ? ?09/04/2021: OV with Prentiss Polio NP for worsening cough and shortness of breath.  She states that over the past week she has had a productive cough that is occasionally with brown sputum and increased shortness of breath with exertion and coughing spells.  She has noticed an occasional wheeze, especially at night.  She has been using her albuterol inhaler 3-6 times a day recently.  She does continue on her Symbicort twice  daily.  \She continues on her Fasenra injections but does not feel as though they are working as well as they used to.  She did tell her allergist this to requested that they wait and see if this changes. Exhaled nitric oxide was nl. Eos were 0 but IgE remained significantly elevated. Message sent to Dr. Simona Huh regarding this and pt's symptoms who stated they would bring the patient in to discuss alternative therapies. Treated for upper airway cough syndrome and possible early asthma flare with prednisone burst and cough control measures. Advised to continue Symbicort.  ? ?11/02/2021: Today-follow-up ?Patient presents today for follow-up.  She also is having some increased shortness of breath over the last few weeks.  Feels like her activity tolerance is just not what it used to be. She notices that her shortness of breath is worse at night and she wakes up feeling a little more winded. Also reports that she snores and has been having worsened fatigue symptoms during the day. Wakes up feeling like she didn't sleep well. Feels like she used to be able to do a lot during the day and now she just wants to go back to bed once she wakes up. She is not sure that this is related to her asthma or not. She denies any wheezing and her cough has resolved since we saw her last. Allergy symptoms feel like they're well controlled recently. She denies morning headaches, drowsy driving, narcolepsy or cataplexy. She denies any recent weight loss, anorexia or hemoptysis.  She continues on Symbicort daily and uses albuterol a few times a week.  ? ?She did call her allergist after she was seen last and again tell them she didn't feel like the [employer] was working. She was scheduled to be seen on 3/20 but the appointment was then rescheduled. She has not been back since and would like to switch her allergist.  ? ?FeNO 23 ppb ? ?Allergies  ?Allergen Reactions  ? Penicillins Swelling  ? ? ?Immunization History  ?Administered Date(s)  Administered  ? Influenza Split 03/25/2012  ? Influenza,inj,Quad PF,6+ Mos 03/25/2013, 03/27/2016  ? PFIZER(Purple Top)SARS-COV-2 Vaccination 10/01/2019, 10/26/2019  ? Pneumococcal Polysaccharide-23 03/25/2012  ? ? ?Past Medical History:  ?Diagnosis Date  ? Asthma   ? Depression   ? goes to [facility], quit taking meds  ? HSV (herpes simplex virus) infection   ? last outbreak 2007  ? ? ?Tobacco History: ?Social History  ? ?Tobacco Use  ?Smoking Status Never  ? Passive exposure: Never  ?Smokeless Tobacco Never  ? ?Counseling given: Not Answered ? ? ?Outpatient Medications Prior to Visit  ?Medication Sig Dispense Refill  ? albuterol (PROAIR HFA) 108 (90 Base) MCG/ACT inhaler INHALE 2 PUFFS EVERY 4 HOURS AS NEEDED ONLY IF YOU CANT CATCH YOUR BREATH 18 each 3  ? albuterol (PROVENTIL) (2.5 MG/3ML) 0.083% nebulizer solution USE 2 VIALS BY NEBULIZATION EVERY 6 (SIX) HOURS AS NEEDED FOR WHEEZING OR SHORTNESS OF BREATH. DX: J45.40 75 mL 3  ? benzonatate (TESSALON) 200 MG capsule Take 1 capsule (200 mg total) by mouth 3 (three) times daily as needed for cough. 30 capsule 1  ? budesonide-formoterol (SYMBICORT) 160-4.5 MCG/ACT inhaler TAKE 2 PUFFS BY MOUTH TWICE A DAY 10.2 each 11  ? fluticasone (FLONASE) 50 MCG/ACT nasal spray Place 1 spray into both nostrils daily. 18.2 mL 2  ? ipratropium (ATROVENT) 0.03 % nasal spray Place 2 sprays into both nostrils 3 (three) times daily. 30 mL 12  ? naproxen sodium (ALEVE) 220 MG tablet Take 220 mg by mouth.    ? ?Facility-Administered Medications Prior to Visit  ?Medication Dose Route Frequency Provider Last Rate Last Admin  ? Benralizumab SOSY 30 mg  30 mg Subcutaneous Q28 days Tonny Bollman, MD   30 mg at 07/17/21 1625  ? ? ? ?Review of Systems:  ? ?Constitutional: No weight loss or gain, night sweats, fevers, chills, lassitude. +excessive daytime fatigue ?HEENT: No headaches, difficulty swallowing, tooth/dental problems, or sore throat. No sneezing, itching, ear ache, nasal congestion, or  post nasal drip. +snoring ?CV:  +PND. No chest pain, orthopnea, swelling in lower extremities, anasarca, dizziness, palpitations, syncope ?Resp: +shortness of breath with exertion. No excess mucus or change in color of mucus. No productive or non-productive. No hemoptysis. No wheezing.  No chest wall deformity ?GI:  No heartburn, indigestion, abdominal pain, nausea, vomiting, diarrhea, change in bowel habits, loss of appetite, bloody stools.  ?GU: No dysuria, change in color of urine, urgency or frequency.  No flank pain, no hematuria  ?Skin: No rash, lesions, ulcerations ?MSK:  No joint pain or swelling.  No decreased range of motion.  No back pain. ?Neuro: No dizziness or lightheadedness.  ?Psych: No depression or anxiety. Mood stable.  ? ? ? ?Physical Exam: ? ?BP 128/76 (BP Location: Left Arm, Patient Position: Sitting, Cuff Size: Normal)   Pulse 85   Temp 98.2 ?F (36.8 ?C) (Oral)   Ht 5\' 8"  (1.727 m)   Wt 211 lb (95.7 kg)   SpO2 98%  BMI 32.08 kg/m?  ? ?GEN: Pleasant, interactive, well-appearing; obese; in no acute distress. ?HEENT:  Normocephalic and atraumatic. EACs patent bilaterally. TM pearly gray with present light reflex bilaterally. PERRLA. Sclera white. Nasal turbinates pink, moist and patent bilaterally. No rhinorrhea present. Oropharynx pink and moist, without exudate or edema. No lesions, ulcerations, or postnasal drip.  ?NECK:  Supple w/ fair ROM. No JVD present. Normal carotid impulses w/o bruits. Thyroid symmetrical with no goiter or nodules palpated. No lymphadenopathy.   ?CV: RRR, no m/r/g, no peripheral edema. Pulses intact, +2 bilaterally. No cyanosis, pallor or clubbing. ?PULMONARY:  Unlabored, regular breathing. Clear bilaterally A&P w/o wheezes/rales/rhonchi. No accessory muscle use. No dullness to percussion. ?GI: BS present and normoactive. Soft, non-tender to palpation.  ?Neuro: A/Ox3. No focal deficits noted.   ?Skin: Warm, no lesions or rashes ?Psych: Normal affect and behavior.  Judgement and thought content appropriate.  ? ? ? ?Lab Results: ? ?CBC ?   ?Component Value Date/Time  ? WBC 7.6 09/04/2021 1602  ? RBC 4.11 09/04/2021 1602  ? HGB 12.9 09/04/2021 1602  ? HGB 13.5 02/03/2018 1421  ?

## 2021-11-02 NOTE — Progress Notes (Deleted)
? ?@Patient  ID: , female    DOB: 05/26/1976, 46 y.o.   MRN: [id number] ? ?Chief Complaint  ?Patient presents with  ? Follow-up  ?  Pt states for the past few weeks she is having issues with her SOB. She is on Symbicort and Flonase. She states she is using the Albuterol a lot more then she normally does. She states she is out of breath more at night.   ? ? ?Referring provider: ?No ref. provider found ? ?HPI: ? ? ?TEST/EVENTS:  ? ?Allergies  ?Allergen Reactions  ? Penicillins Swelling  ? ? ?Immunization History  ?Administered Date(s) Administered  ? Influenza Split 03/25/2012  ? Influenza,inj,Quad PF,6+ Mos 03/25/2013, 03/27/2016  ? PFIZER(Purple Top)SARS-COV-2 Vaccination 10/01/2019, 10/26/2019  ? Pneumococcal Polysaccharide-23 03/25/2012  ? ? ?Past Medical History:  ?Diagnosis Date  ? Asthma   ? Depression   ? goes to [facility], quit taking meds  ? HSV (herpes simplex virus) infection   ? last outbreak 2007  ? ? ?Tobacco History: ?Social History  ? ?Tobacco Use  ?Smoking Status Never  ? Passive exposure: Never  ?Smokeless Tobacco Never  ? ?Counseling given: Not Answered ? ? ?Outpatient Medications Prior to Visit  ?Medication Sig Dispense Refill  ? albuterol (PROAIR HFA) 108 (90 Base) MCG/ACT inhaler INHALE 2 PUFFS EVERY 4 HOURS AS NEEDED ONLY IF YOU CANT CATCH YOUR BREATH 18 each 3  ? albuterol (PROVENTIL) (2.5 MG/3ML) 0.083% nebulizer solution USE 2 VIALS BY NEBULIZATION EVERY 6 (SIX) HOURS AS NEEDED FOR WHEEZING OR SHORTNESS OF BREATH. DX: J45.40 75 mL 3  ? benzonatate (TESSALON) 200 MG capsule Take 1 capsule (200 mg total) by mouth 3 (three) times daily as needed for cough. 30 capsule 1  ? budesonide-formoterol (SYMBICORT) 160-4.5 MCG/ACT inhaler TAKE 2 PUFFS BY MOUTH TWICE A DAY 10.2 each 11  ? fluticasone (FLONASE) 50 MCG/ACT nasal spray Place 1 spray into both nostrils daily. 18.2 mL 2  ? ipratropium (ATROVENT) 0.03 % nasal spray Place 2 sprays into both nostrils 3 (three) times daily. 30 mL 12  ?  naproxen sodium (ALEVE) 220 MG tablet Take 220 mg by mouth.    ? ?Facility-Administered Medications Prior to Visit  ?Medication Dose Route Frequency Provider Last Rate Last Admin  ? Benralizumab SOSY 30 mg  30 mg Subcutaneous Q28 days 2008, MD   30 mg at 07/17/21 1625  ? ? ? ?Review of Systems:  ? ?Constitutional: No weight loss or gain, night sweats, fevers, chills, fatigue, or lassitude. ?HEENT: No headaches, difficulty swallowing, tooth/dental problems, or sore throat. No sneezing, itching, ear ache, nasal congestion, or post nasal drip ?CV:  No chest pain, orthopnea, PND, swelling in lower extremities, anasarca, dizziness, palpitations, syncope ?Resp: No shortness of breath with exertion or at rest. No excess mucus or change in color of mucus. No productive or non-productive. No hemoptysis. No wheezing.  No chest wall deformity ?GI:  No heartburn, indigestion, abdominal pain, nausea, vomiting, diarrhea, change in bowel habits, loss of appetite, bloody stools.  ?GU: No dysuria, change in color of urine, urgency or frequency.  No flank pain, no hematuria  ?Skin: No rash, lesions, ulcerations ?MSK:  No joint pain or swelling.  No decreased range of motion.  No back pain. ?Neuro: No dizziness or lightheadedness.  ?Psych: No depression or anxiety. Mood stable.  ? ? ? ?Physical Exam: ? ?BP 128/76 (BP Location: Left Arm, Patient Position: Sitting, Cuff Size: Normal)   Pulse 85   Temp 98.2 ?  F (36.8 ?C) (Oral)   Ht 5\' 8"  (1.727 m)   Wt 211 lb (95.7 kg)   SpO2 98%   BMI 32.08 kg/m?  ? ?GEN: Pleasant, interactive, well-nourished/chronically-ill appearing/acutely-ill appearing/poorly-nourished/morbidly obese; in no acute distress.****** ?HEENT:  Normocephalic and atraumatic. EACs patent bilaterally. TM pearly gray with present light reflex bilaterally. PERRLA. Sclera white. Nasal turbinates pink, moist and patent bilaterally. No rhinorrhea present. Oropharynx pink and moist, without exudate or edema. No  lesions, ulcerations, or postnasal drip.  ?NECK:  Supple w/ fair ROM. No JVD present. Normal carotid impulses w/o bruits. Thyroid symmetrical with no goiter or nodules palpated. No lymphadenopathy.   ?CV: RRR, no m/r/g, no peripheral edema. Pulses intact, +2 bilaterally. No cyanosis, pallor or clubbing. ?PULMONARY:  Unlabored, regular breathing. Clear bilaterally A&P w/o wheezes/rales/rhonchi. No accessory muscle use. No dullness to percussion. ?GI: BS present and normoactive. Soft, non-tender to palpation. No organomegaly or masses detected. No CVA tenderness. ?MSK: No erythema, warmth or tenderness. Cap refil <2 sec all extrem. No deformities or joint swelling noted.  ?Neuro: A/Ox3. No focal deficits noted.   ?Skin: Warm, no lesions or rashe ?Psych: Normal affect and behavior. Judgement and thought content appropriate.  ? ? ? ?Lab Results: ? ?CBC ?   ?Component Value Date/Time  ? WBC 7.6 09/04/2021 1602  ? RBC 4.11 09/04/2021 1602  ? HGB 12.9 09/04/2021 1602  ? HGB 13.5 02/03/2018 1421  ? HCT 38.3 09/04/2021 1602  ? HCT 40.2 02/03/2018 1421  ? PLT 354.0 09/04/2021 1602  ? PLT 402 02/03/2018 1421  ? MCV 93.1 09/04/2021 1602  ? MCV 95 02/03/2018 1421  ? MCH 31.6 01/04/2021 1949  ? MCHC 33.6 09/04/2021 1602  ? RDW 13.3 09/04/2021 1602  ? RDW 13.7 02/03/2018 1421  ? LYMPHSABS 2.7 09/04/2021 1602  ? LYMPHSABS 2.0 02/03/2018 1421  ? MONOABS 0.5 09/04/2021 1602  ? EOSABS 0.0 09/04/2021 1602  ? EOSABS 0.2 02/03/2018 1421  ? BASOSABS 0.0 09/04/2021 1602  ? BASOSABS 0.0 02/03/2018 1421  ? ? ?BMET ?   ?Component Value Date/Time  ? NA 138 01/04/2021 1949  ? K 3.5 01/04/2021 1949  ? CL 105 01/04/2021 1949  ? CO2 25 01/04/2021 1949  ? GLUCOSE 97 01/04/2021 1949  ? BUN 9 01/04/2021 1949  ? CREATININE 0.70 01/04/2021 1949  ? CALCIUM 9.2 01/04/2021 1949  ? GFRNONAA >60 01/04/2021 1949  ? GFRAA >60 12/10/2014 0604  ? ? ?BNP ?   ?Component Value Date/Time  ? BNP 6.6 12/10/2014 0604  ? ? ? ?Imaging: ? ?No results found. ? ? ? ?   ?  View : No data to display.  ?  ?  ?  ? ? ?Lab Results  ?Component Value Date  ? NITRICOXIDE 7 12/16/2017  ? ? ? ? ? ? ?Assessment & Plan:  ? ?No problem-specific Assessment & Plan notes found for this encounter. ? ? ?I spent *** minutes of dedicated to the care of this patient on the date of this encounter to include pre-visit review of records, face-to-face time with the patient discussing conditions above, post visit ordering of testing, clinical documentation with the electronic health record, making appropriate referrals as documented, and communicating necessary findings to members of the patients care team. ? ?12/18/2017, NP ?11/02/2021 ? ?Pt aware and understands NP's role.  ? ?

## 2021-11-02 NOTE — Patient Instructions (Addendum)
Stop Symbicort. Trial Breztri 2 puffs Twice daily. Brush tongue and rinse mouth afterwards  ?Continue Albuterol inhaler 2 puffs or 3 mL neb every 6 hours as needed for shortness of breath or wheezing. Notify if symptoms persist despite rescue inhaler/neb use. ?Continue flonase 2 sprays each nostril daily  ?Continue atrovent 2 sprays each nostril Twice daily  ? ?I will send in a prednisone burst for you to have on hand if your breathing does not get better with the [facility]. ? ?Split night sleep study ordered - someone will contact you for scheduling. ? ?Follow up after HST with Katie Nonie Lochner,NP. If symptoms do not improve or worsen, please contact office for sooner follow up or seek emergency care. ?

## 2021-11-02 NOTE — Assessment & Plan Note (Signed)
Appears well controlled on current regimen.  Not currently getting Fasenra injections.  Felt like they were not working well for her previously. ?

## 2021-11-02 NOTE — Assessment & Plan Note (Signed)
Patient has excessive daytime sleepiness, PND, snoring and is obese.  I am concerned she could possibly have obstructive sleep apnea which could be contributing to her progressive DOE and morning breathlessness.  Advised split-night sleep study for further evaluation; has Medicaid so must do lab study. We discussed how untreated sleep apnea puts an individual at risk for cardiac arrhthymias, pulm HTN, DM, stroke and increases their risk for daytime accidents. We also briefly reviewed treatment options including weight loss, side sleeping position, oral appliance, CPAP therapy or referral to ENT for possible surgical options ?

## 2021-11-13 ENCOUNTER — Telehealth: Payer: Self-pay | Admitting: Nurse Practitioner

## 2021-11-13 DIAGNOSIS — R0683 Snoring: Secondary | ICD-10-CM

## 2021-11-13 DIAGNOSIS — J4541 Moderate persistent asthma with (acute) exacerbation: Secondary | ICD-10-CM

## 2021-11-13 DIAGNOSIS — G4719 Other hypersomnia: Secondary | ICD-10-CM

## 2021-11-13 DIAGNOSIS — D824 Hyperimmunoglobulin E [IgE] syndrome: Secondary | ICD-10-CM

## 2021-11-13 MED ORDER — BREZTRI AEROSPHERE 160-9-4.8 MCG/ACT IN AERO: 2.0000 | INHALATION_SPRAY | Freq: Two times a day (BID) | RESPIRATORY_TRACT | 0 refills | Status: DC

## 2021-11-13 MED ORDER — BREZTRI AEROSPHERE 160-9-4.8 MCG/ACT IN AERO: 2.0000 | INHALATION_SPRAY | Freq: Two times a day (BID) | RESPIRATORY_TRACT | 5 refills | Status: DC

## 2021-11-13 NOTE — Telephone Encounter (Signed)
Have obtained samples of Breztri for pt. Went out to the lobby to give these to pt and was told that pt had left the office.  Called and spoke with pt letting her know that we did have samples to give her and also asked her about her symptoms to see when they began and to see if she had any lapse in therapy with her inhalers.   Pt said that she had just got back from [place] and woke up this morning 5/22 feeling a little more SOB. Pt said that she never had a lapse in therapy. Said she was taking the [employer] and when she went to pharmacy to pick up Rx, was told that Rx for Symbicort had been sent to pharmacy instead. Asked pt if she had been using her rescue inhaler any and she said she had to use it at least 3 times daily to help with her symptoms. Advised pt that we should schedule an OV and she verbalized understanding. Pt has been scheduled for OV with Dr. Thora Lance tomorrow 5/23. Nothing further needed.

## 2021-11-14 ENCOUNTER — Encounter: Payer: Self-pay | Admitting: Student

## 2021-11-14 ENCOUNTER — Ambulatory Visit (INDEPENDENT_AMBULATORY_CARE_PROVIDER_SITE_OTHER): Payer: Medicaid Other | Admitting: Student

## 2021-11-14 VITALS — BP 118/84 | HR 78 | Temp 98.9°F | Ht 68.0 in | Wt 209.8 lb

## 2021-11-14 DIAGNOSIS — J4541 Moderate persistent asthma with (acute) exacerbation: Secondary | ICD-10-CM | POA: Diagnosis not present

## 2021-11-14 MED ORDER — PREDNISONE 10 MG PO TABS
ORAL_TABLET | ORAL | 0 refills | Status: DC
Start: 2021-11-14 — End: 2022-02-19

## 2021-11-14 NOTE — Progress Notes (Signed)
Synopsis: Referred for acute dyspnea by No ref. provider found  Subjective:   PATIENT ID: Crystal Sampson GENDER: female DOB: [birth year], MRN: [id number]  Chief Complaint  Patient presents with   Acute Visit    Pt states her breathing has not improved since recent ov. She is waking up in the night feeling SOB. Denies any cough or wheezing. She is using her albuterol inhaler 4 x per day on average.    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 She did very well from respiratory standpoint at [facility]. And then she's felt terrible since returning. Using breztri 2 puffs twice daily with spacer - was just started on that a week ago. Her asthma symptoms usually manifest primarily with trouble with DOE. She doesn't have any associated throat tightness during attacks.   Sinus congestion is ok, no postnasal drainage. Reflux is bothersome. Takes aleve only once in a blue moon.   Otherwise pertinent review of systems is negative.  Past Medical History:  Diagnosis Date   Asthma    Depression    goes to [facility], quit taking meds   HSV (herpes simplex virus) infection    last outbreak 2007     Family History  Problem Relation Age of Onset   Glaucoma Mother    Hypertension Father    Other Neg Hx      Past Surgical History:  Procedure Laterality Date   NO PAST SURGERIES     WISDOM TOOTH EXTRACTION Bilateral     Social History   Socioeconomic History   Marital status: Single    Spouse name: Not on file   Number of children: Not on file   Years of education: Not on file   Highest education level: Not on file  Occupational History    Employer: [employer]    Comment: porter/transportation  Tobacco Use   Smoking status: Never    Passive exposure: Never   Smokeless tobacco: Never  Vaping Use   Vaping Use:  Never used  Substance and Sexual Activity   Alcohol use: No   Drug use: No   Sexual activity: Yes    Birth control/protection: None    Comment: implanon since 2009  Other Topics Concern   Not on file  Social History Narrative   Not on file   Social Determinants of Health   Financial Resource Strain: Not on file  Food Insecurity: Not on file  Transportation Needs: Not on file  Physical Activity: Not on file  Stress: Not on file  Social Connections: Not on file  Intimate Partner Violence: Not on file     Allergies  Allergen Reactions   Penicillins Swelling     Outpatient Medications Prior to Visit  Medication Sig Dispense Refill   albuterol (PROAIR HFA) 108 (90 Base) MCG/ACT inhaler INHALE 2 PUFFS EVERY 4 HOURS AS NEEDED ONLY IF YOU CANT CATCH YOUR BREATH 18 each 3   albuterol (PROVENTIL) (2.5 MG/3ML) 0.083% nebulizer solution USE 2 VIALS BY NEBULIZATION EVERY 6 (SIX) HOURS AS NEEDED FOR WHEEZING OR SHORTNESS OF BREATH. DX: J45.40 75 mL 3   benzonatate (TESSALON) 200 MG capsule Take 1 capsule (200 mg total) by mouth 3 (three) times daily as needed for cough. 30 capsule 1   Budeson-Glycopyrrol-Formoterol (BREZTRI AEROSPHERE) 160-9-4.8 MCG/ACT AERO Inhale 2 puffs into the  lungs in the morning and at bedtime. 10.7 g 5   fluticasone (FLONASE) 50 MCG/ACT nasal spray Place 1 spray into both nostrils daily. 18.2 mL 2   naproxen sodium (ALEVE) 220 MG tablet Take 220 mg by mouth.     ipratropium (ATROVENT) 0.03 % nasal spray Place 2 sprays into both nostrils 3 (three) times daily. 30 mL 12   Facility-Administered Medications Prior to Visit  Medication Dose Route Frequency Provider Last Rate Last Admin   Benralizumab SOSY 30 mg  30 mg Subcutaneous Q28 days Tonny Bollman, MD   30 mg at 07/17/21 1625       Objective:   Physical Exam:  General appearance: 46 y.o., female, NAD, conversant  Eyes: anicteric sclerae; PERRL, tracking appropriately HENT: NCAT; MMM Neck: Trachea  midline; no lymphadenopathy, no JVD Lungs: CTAB, no crackles, no wheeze, with normal respiratory effort CV: RRR, no murmur  Abdomen: Soft, non-tender; non-distended, BS present  Extremities: No peripheral edema, warm Skin: Normal turgor and texture; no rash Psych: Appropriate affect Neuro: Alert and oriented to person and place, no focal deficit     Vitals:   11/14/21 1120  Pulse: 78  SpO2: 97%   97% on RA BMI Readings from Last 3 Encounters:  11/02/21 32.08 kg/m  09/04/21 32.05 kg/m  06/30/21 31.78 kg/m   Wt Readings from Last 3 Encounters:  11/02/21 211 lb (95.7 kg)  09/04/21 210 lb 12.8 oz (95.6 kg)  06/30/21 209 lb (94.8 kg)     CBC    Component Value Date/Time   WBC 7.6 09/04/2021 1602   RBC 4.11 09/04/2021 1602   HGB 12.9 09/04/2021 1602   HGB 13.5 02/03/2018 1421   HCT 38.3 09/04/2021 1602   HCT 40.2 02/03/2018 1421   PLT 354.0 09/04/2021 1602   PLT 402 02/03/2018 1421   MCV 93.1 09/04/2021 1602   MCV 95 02/03/2018 1421   MCH 31.6 01/04/2021 1949   MCHC 33.6 09/04/2021 1602   RDW 13.3 09/04/2021 1602   RDW 13.7 02/03/2018 1421   LYMPHSABS 2.7 09/04/2021 1602   LYMPHSABS 2.0 02/03/2018 1421   MONOABS 0.5 09/04/2021 1602   EOSABS 0.0 09/04/2021 1602   EOSABS 0.2 02/03/2018 1421   BASOSABS 0.0 09/04/2021 1602   BASOSABS 0.0 02/03/2018 1421    Chest Imaging: CXR reviewed by me with chronic bronchitic appearance  Pulmonary Functions Testing Results:     View : No data to display.             Assessment & Plan:   # Severe persistent asthma with slow to resolve acute exacerbation To me doesn't sound like she's in exacerbation today but she believes another round of steroids will knock this out.   Plan: - given very high IgE and difficult to control asthma check Aspergillus specific IgE and HRCT chest to ensure there's not evidence of ABPA or alternative asthma mimic - referral placed for allergist in [place] system per pt request -  prednisone taper sent in - continue Breztri 2 puffs Twice daily. Brush tongue and rinse mouth afterwards, samples given - Continue Albuterol inhaler 2 puffs or 3 mL neb every 6 hours as needed for shortness of breath or wheezing. Notify if symptoms persist despite rescue inhaler/neb use. - Continue flonase 2 sprays each nostril daily  - Continue atrovent 2 sprays each nostril Twice daily   Has follow up with Rhunette Croft 6/13   Omar Person, MD [facility] 11/14/2021 11:22 AM

## 2021-11-14 NOTE — Patient Instructions (Signed)
-   labs next visit, you will be called to schedule HRCT Chest  - prednisone taper sent in - continue Breztri 2 puffs Twice daily. Brush tongue and rinse mouth afterwards  - Continue Albuterol inhaler 2 puffs or 3 mL neb every 6 hours as needed for shortness of breath or wheezing. Notify if symptoms persist despite rescue inhaler/neb use. - Continue flonase 2 sprays each nostril daily  - Continue atrovent 2 sprays each nostril Twice daily    Split night sleep study ordered - someone will contact you for scheduling.  Follow up after HST with Katie Cobb,NP. If symptoms do not improve or worsen, please contact office for sooner follow up or seek emergency care.

## 2021-11-19 ENCOUNTER — Other Ambulatory Visit: Payer: Self-pay | Admitting: Nurse Practitioner

## 2021-11-19 DIAGNOSIS — D824 Hyperimmunoglobulin E [IgE] syndrome: Secondary | ICD-10-CM

## 2021-11-19 DIAGNOSIS — R0683 Snoring: Secondary | ICD-10-CM

## 2021-11-19 DIAGNOSIS — J4541 Moderate persistent asthma with (acute) exacerbation: Secondary | ICD-10-CM

## 2021-11-19 DIAGNOSIS — G4719 Other hypersomnia: Secondary | ICD-10-CM

## 2021-11-24 ENCOUNTER — Other Ambulatory Visit: Payer: Medicaid Other

## 2021-12-06 ENCOUNTER — Ambulatory Visit (INDEPENDENT_AMBULATORY_CARE_PROVIDER_SITE_OTHER): Payer: Medicaid Other | Admitting: Nurse Practitioner

## 2021-12-06 ENCOUNTER — Encounter: Payer: Self-pay | Admitting: Nurse Practitioner

## 2021-12-06 VITALS — BP 126/88 | HR 80 | Temp 98.5°F | Ht 68.0 in | Wt 212.2 lb

## 2021-12-06 DIAGNOSIS — J3089 Other allergic rhinitis: Secondary | ICD-10-CM

## 2021-12-06 DIAGNOSIS — J302 Other seasonal allergic rhinitis: Secondary | ICD-10-CM

## 2021-12-06 DIAGNOSIS — D824 Hyperimmunoglobulin E [IgE] syndrome: Secondary | ICD-10-CM

## 2021-12-06 DIAGNOSIS — J4550 Severe persistent asthma, uncomplicated: Secondary | ICD-10-CM | POA: Diagnosis not present

## 2021-12-06 DIAGNOSIS — G4719 Other hypersomnia: Secondary | ICD-10-CM

## 2021-12-06 NOTE — Patient Instructions (Addendum)
Continue Breztri 2 puffs Twice daily. Brush tongue and rinse mouth afterwards, samples given. Please complete the patient assistance application and return  - Continue Albuterol inhaler 2 puffs or 3 mL neb every 6 hours as needed for shortness of breath or wheezing. Notify if symptoms persist despite rescue inhaler/neb use. - Continue flonase 2 sprays each nostril daily  - Continue atrovent 2 sprays each nostril Twice daily   Split night sleep study ordered at previous visit - someone should be contacting you for scheduling  Attend high resolution CT scan on 6/23  Labs today - aspergillus panel, eosinophil count, IgE  Attend new patient visit with Dr. Dellis Anes in July  Follow up last week in June with Dr. Thora Lance for follow up to discuss HRCT results. If symptoms do not improve or worsen, please contact office for sooner follow up or seek emergency care.

## 2021-12-06 NOTE — Assessment & Plan Note (Signed)
Currently stable.  Previously on Fasenra but has not been on in some time.  Plans to meet with Dr. Dellis Anes in July for new patient allergy referral.

## 2021-12-06 NOTE — Assessment & Plan Note (Signed)
Excessive daytime fatigue and some PND at night.  Previously ordered to undergo split-night study given concern for OSA.  This has not been scheduled upon my review today.  Reach out to the [facility] which are currently trying to obtain prior authorization from her insurance.  They will contact her for scheduling as soon as they receive this.  Cautioned to be aware of drowsy driving and pull over if tired.

## 2021-12-06 NOTE — Progress Notes (Signed)
@Patient  ID: , female    DOB: [birth year], 46 y.o.   MRN: [id number]  Chief Complaint  Patient presents with   Follow-up    Follow up. Patient says she needs to talk about her inhaler.     Referring provider: No ref. provider found  HPI: 46 year old female, never smoker followed for moderate persistent asthma.  She is a patient of Dr. 59 and was last seen in office on 11/14/2021 by Dr. 11/16/2021.  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  Followed by allergist.  TEST/EVENTS:  10/21/2020 IgE: 1859 kU/L 02/13/2021 spirometry: FEV1 1.73, ratio 84 06/30/2021 CXR 2 view: Both lungs were clear.  There was no evidence of active cardiopulmonary disease 09/04/2021: Eos 0, IgE 1749; FeNO 19 ppb  06/30/2021: OV with Britney Captain NP for URI symptoms and associated cough.  Negative for COVID and flu at urgent care.  Continue to have a persistent cough with brown sputum production, nasal congestion rhinorrhea.  Treated for mild asthma exacerbation and rhinosinusitis with 7-day Doxy course and prednisone burst.  Supportive care advised as well.  CXR was clear without any evidence of superimposed infection  09/04/2021: OV with Akayla Brass NP for worsening cough and shortness of breath.  She states that over the past week she has had a productive cough that is occasionally with brown sputum and increased shortness of breath with exertion and coughing spells.  She has noticed an occasional wheeze, especially at night.  She has been using her albuterol inhaler 3-6 times a day recently.  She does continue on her Symbicort twice daily.  \She continues on her Fasenra injections but does not feel as though they are working as well as they used to.  She did tell her allergist this to requested that they wait and see if this changes. Exhaled nitric oxide was nl. Eos were 0 but IgE remained significantly elevated. Message sent to Dr.  09/06/2021 regarding this and pt's symptoms who stated they would bring the patient in to discuss alternative therapies. Treated for upper airway cough syndrome and possible early asthma flare with prednisone burst and cough control measures. Advised to continue Symbicort.   11/02/2021: OV with Miraya Cudney NP for follow-up.  She also is having some increased shortness of breath over the last few weeks.  Feels like her activity tolerance is just not what it used to be. She notices that her shortness of breath is worse at night and she wakes up feeling a little more winded. Also reports that she snores and has been having worsened fatigue symptoms during the day. Wakes up feeling like she didn't sleep well. Feels like she used to be able to do a lot during the day and now she just wants to go back to bed once she wakes up. She is not sure that this is related to her asthma or not. FeNO nl and no evidence of bronchospasm; stepped up to [facility]. Advised holding off on prednisone but provided with a burst for her to have on hand in case symptoms worsen or do not improve. Split night ordered for evaluation of fatigue/PND. She did call her allergist after she was seen last and again tell them she didn't feel like the [facility] was working. She was scheduled to be seen on 3/20 but the appointment was then rescheduled. She has not been back since and would like to switch her allergist. Referral sent.   11/14/2021: OV  with Dr. Thora Lance. Ended up taking the prednisone burst prescribed at previous visit and felt like her symptoms improved. She also felt like the [employer] was helping some but just started the week prior so wasn't entirely sure. Did not appear to be in acute exacerbation but pt requested repeat prednisone course to help her get back to baseline - pred taper prescribed. Given her hard to control asthma and history of very elevated IgE, ordered aspergillus, eosinophil count, IgE and HRCT to ensure there's no evidence of ABPA or  alternative asthma mimic. Continue triple therapy with Breztri.   12/06/2021: Today - follow up Patient presents today for follow up. Her breathing has been stable and she has been feeling well since last course of prednisone and switching to [employer]. Feels like this works better for her. However, it is expensive despite insurance. Her medicaid will cover $300 a month but this still leaves her with $267 she has to pay out of pocket. She denies any wheezing or recent cough. She has HRCT scheduled June 23rd. Labs ordered at last visit were never collected. Feels like her allergies are stable right now. She has a new patient appointment with Dr. Dellis Anes in July. She has yet to have her split night study. Continues to have daytime fatigue and some PND.   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 DX: J45.40 75 mL 3   benzonatate (TESSALON) 200 MG capsule Take 1 capsule (200 mg total) by mouth 3 (three) times daily as needed for cough. 30 capsule 1    Budeson-Glycopyrrol-Formoterol (BREZTRI AEROSPHERE) 160-9-4.8 MCG/ACT AERO Inhale 2 puffs into the lungs in the morning and at bedtime. 10.7 g 5   fluticasone (FLONASE) 50 MCG/ACT nasal spray Place 1 spray into both nostrils daily. 18.2 mL 2   naproxen sodium (ALEVE) 220 MG tablet Take 220 mg by mouth.     predniSONE (DELTASONE) 10 MG tablet Take 4 tabs by mouth for 3 days, then 3 for 3 days, 2 for 3 days, 1 for 3 days and stop 30 tablet 0   Facility-Administered Medications Prior to Visit  Medication Dose Route Frequency Provider Last Rate Last Admin   Benralizumab SOSY 30 mg  30 mg Subcutaneous Q28 days Verlee Monte, MD   30 mg at 07/17/21 1625     Review of Systems:   Constitutional: No weight loss or gain, night sweats, fevers, chills, lassitude. +excessive daytime fatigue HEENT: No headaches, difficulty swallowing, tooth/dental problems, or sore throat. No sneezing, itching, ear ache, nasal  congestion, or post nasal drip. +snoring CV:  +PND. No chest pain, orthopnea, swelling in lower extremities, anasarca, dizziness, palpitations, syncope Resp: +shortness of breath with strenuous exertion (baseline). No excess mucus or change in color of mucus. No productive or non-productive. No hemoptysis. No wheezing.  No chest wall deformity GI:  No heartburn, indigestion, abdominal pain, nausea, vomiting, diarrhea, change in bowel habits, loss of appetite, bloody stools.  GU: No dysuria, change in color of urine, urgency or frequency.  No flank pain, no hematuria  Skin: No rash, lesions, ulcerations MSK:  No joint pain or swelling.  No decreased range of motion.  No back pain. Neuro: No dizziness or lightheadedness.  Psych: No depression or anxiety. Mood stable.     Physical Exam:  BP 126/88 (BP Location: Right Arm, Patient Position: Sitting, Cuff Size: Normal)   Pulse 80   Temp 98.5 F (36.9 C) (Oral)   Ht 5\' 8"  (1.727 m)   Wt 212 lb 3.2 oz (96.3 kg)   SpO2 99%   BMI 32.26 kg/m    GEN: Pleasant, interactive, well-appearing; obese; in no acute distress. HEENT:  Normocephalic and atraumatic. EACs patent bilaterally. TM pearly gray with present light reflex bilaterally. PERRLA. Sclera white. Nasal turbinates pink, moist and patent bilaterally. No rhinorrhea present. Oropharynx pink and moist, without exudate or edema. No lesions, ulcerations, or postnasal drip.  NECK:  Supple w/ fair ROM. No JVD present. Normal carotid impulses w/o bruits. Thyroid symmetrical with no goiter or nodules palpated. No lymphadenopathy.   CV: RRR, no m/r/g, no peripheral edema. Pulses intact, +2 bilaterally. No cyanosis, pallor or clubbing. PULMONARY:  Unlabored, regular breathing. Clear bilaterally A&P w/o wheezes/rales/rhonchi. No accessory muscle use. No dullness to percussion. GI: BS present and normoactive. Soft, non-tender to palpation.  Neuro: A/Ox3. No focal deficits noted.   Skin: Warm, no lesions or rashes Psych: Normal affect and behavior. Judgement and thought content appropriate.     Lab Results:  CBC    Component Value Date/Time   WBC 7.6 09/04/2021 1602   RBC 4.11 09/04/2021 1602   HGB 12.9 09/04/2021 1602   HGB 13.5 02/03/2018 1421   HCT 38.3 09/04/2021 1602   HCT 40.2 02/03/2018 1421   PLT 354.0 09/04/2021 1602   PLT 402 02/03/2018 1421   MCV 93.1 09/04/2021 1602   MCV 95 02/03/2018 1421   MCH 31.6 01/04/2021 1949   MCHC 33.6 09/04/2021 1602   RDW 13.3 09/04/2021 1602   RDW 13.7 02/03/2018 1421   LYMPHSABS 2.7 09/04/2021 1602   LYMPHSABS 2.0 02/03/2018 1421   MONOABS 0.5 09/04/2021 1602   EOSABS 0.0 09/04/2021 1602   EOSABS 0.2 02/03/2018 1421   BASOSABS 0.0 09/04/2021 1602   BASOSABS 0.0 02/03/2018 1421    BMET    Component Value Date/Time   NA 138 01/04/2021 1949   K 3.5 01/04/2021 1949   CL 105 01/04/2021 1949   CO2 25 01/04/2021 1949   GLUCOSE 97 01/04/2021 1949   BUN 9 01/04/2021 1949   CREATININE 0.70 01/04/2021 1949   CALCIUM 9.2 01/04/2021  1949   GFRNONAA >60 01/04/2021 1949   GFRAA >60 12/10/2014 0604    BNP    Component Value Date/Time   BNP 6.6 12/10/2014 0604     Imaging:  No results found.         No data to display          Lab Results  Component Value Date   NITRICOXIDE 7 12/16/2017  Assessment & Plan:   Severe persistent allergic asthma Compensated on current regimen.  She appears to have improved with use of Breztri.  Has had issues with cost.  Provided with patient assistance application today to see if they can help get this covered.  She is anticipated to have HRCT on 6/23.  We will draw the previously ordered IgE, Aspergillus panel and eosinophil count today.  Need to ensure there is no evidence of ABPA or alternative asthma mimic.  Patient Instructions  Continue Breztri 2 puffs Twice daily. Brush tongue and rinse mouth afterwards, samples given. Please complete the patient assistance application and return  - Continue Albuterol inhaler 2 puffs or 3 mL neb every 6 hours as needed for shortness of breath or wheezing. Notify if symptoms persist despite rescue inhaler/neb use. - Continue flonase 2 sprays each nostril daily  - Continue atrovent 2 sprays each nostril Twice daily   Split night sleep study ordered at previous visit - someone should be contacting you for scheduling  Attend high resolution CT scan on 6/23  Labs today - aspergillus panel, eosinophil count, IgE  Attend new patient visit with Dr. Dellis Anes in July  Follow up last week in June with Dr. Thora Lance for follow up to discuss HRCT results. If symptoms do not improve or worsen, please contact office for sooner follow up or seek emergency care.   Seasonal and perennial allergic rhinitis Currently stable.  Previously on Fasenra but has not been on in some time.  Plans to meet with Dr. Dellis Anes in July for new patient allergy referral.  Hyper-IgE syndrome (HCC) Recheck IgE today.  See above plan.  Excessive  daytime sleepiness Excessive daytime fatigue and some PND at night.  Previously ordered to undergo split-night study given concern for OSA.  This has not been scheduled upon my review today.  Reach out to the [facility] which are currently trying to obtain prior authorization from her insurance.  They will contact her for scheduling as soon as they receive this.  Cautioned to be aware of drowsy driving and pull over if tired.     I spent 32 minutes of dedicated to the care of this patient on the date of this encounter to include pre-visit review of records, face-to-face time with the patient discussing conditions above, post visit ordering of testing, clinical documentation with the electronic health record, making appropriate referrals as documented, and communicating necessary findings to members of the patients care team.  Noemi Chapel, NP 12/06/2021  Pt aware and understands NP's role.

## 2021-12-06 NOTE — Assessment & Plan Note (Signed)
Recheck IgE today.  See above plan.

## 2021-12-06 NOTE — Assessment & Plan Note (Signed)
Compensated on current regimen.  She appears to have improved with use of Breztri.  Has had issues with cost.  Provided with patient assistance application today to see if they can help get this covered.  She is anticipated to have HRCT on 6/23.  We will draw the previously ordered IgE, Aspergillus panel and eosinophil count today.  Need to ensure there is no evidence of ABPA or alternative asthma mimic.  Patient Instructions  Continue Breztri 2 puffs Twice daily. Brush tongue and rinse mouth afterwards, samples given. Please complete the patient assistance application and return  - Continue Albuterol inhaler 2 puffs or 3 mL neb every 6 hours as needed for shortness of breath or wheezing. Notify if symptoms persist despite rescue inhaler/neb use. - Continue flonase 2 sprays each nostril daily  - Continue atrovent 2 sprays each nostril Twice daily   Split night sleep study ordered at previous visit - someone should be contacting you for scheduling  Attend high resolution CT scan on 6/23  Labs today - aspergillus panel, eosinophil count, IgE  Attend new patient visit with Dr. Dellis Anes in July  Follow up last week in June with Dr. Thora Lance for follow up to discuss HRCT results. If symptoms do not improve or worsen, please contact office for sooner follow up or seek emergency care.

## 2021-12-07 LAB — EOSINOPHIL COUNT
Eosinophils Absolute: 263 cells/uL (ref 15–500)
Eosinophils Relative: 3.7 %
WBC: 7.1 10*3/uL (ref 3.8–10.8)

## 2021-12-07 LAB — IGE: IgE (Immunoglobulin E), Serum: 1496 kU/L — ABNORMAL HIGH (ref <=114)

## 2021-12-07 MED ORDER — BREZTRI AEROSPHERE 160-9-4.8 MCG/ACT IN AERO: 2.0000 | INHALATION_SPRAY | Freq: Two times a day (BID) | RESPIRATORY_TRACT | 0 refills | Status: DC

## 2021-12-07 NOTE — Addendum Note (Signed)
Addended by: Arlyss Repress on: 12/07/2021 08:50 AM   Modules accepted: Orders

## 2021-12-12 LAB — ASPERGILLUS IGE PANEL
A. Amstel/Glaucu Class Interp: 0
A. Flavus Class Interp: 1
A. Fumigatus Class Interp: 1
A. Nidulans Class Interp: 0
A. Versicolor Class Interp: 0
Aspergillus amstel/glaucu IgE*: <0.35 kU/L (ref <0.35)
Aspergillus flavus IgE: 0.64 kU/L — ABNORMAL HIGH (ref <0.35)
Aspergillus fumigatus IgE: 0.43 kU/L — ABNORMAL HIGH (ref <0.35)
Aspergillus nidulans IgE: <0.35 kU/L (ref <0.35)
Aspergillus niger IgE: 0.14 kU/L (ref <0.35)
Aspergillus versicolor IgE: <0.1 kU/L (ref <0.35)

## 2021-12-15 ENCOUNTER — Other Ambulatory Visit: Payer: Medicaid Other

## 2021-12-18 ENCOUNTER — Ambulatory Visit: Payer: Medicaid Other | Admitting: Student

## 2021-12-27 ENCOUNTER — Telehealth: Payer: Self-pay | Admitting: Nurse Practitioner

## 2021-12-27 NOTE — Telephone Encounter (Signed)
Will complete form for patient and call her once it has been completed.

## 2021-12-30 ENCOUNTER — Other Ambulatory Visit: Payer: Self-pay | Admitting: Nurse Practitioner

## 2021-12-30 DIAGNOSIS — D824 Hyperimmunoglobulin E [IgE] syndrome: Secondary | ICD-10-CM

## 2021-12-30 DIAGNOSIS — J4541 Moderate persistent asthma with (acute) exacerbation: Secondary | ICD-10-CM

## 2021-12-30 DIAGNOSIS — R0683 Snoring: Secondary | ICD-10-CM

## 2021-12-30 DIAGNOSIS — G4719 Other hypersomnia: Secondary | ICD-10-CM

## 2022-01-01 ENCOUNTER — Other Ambulatory Visit (HOSPITAL_COMMUNITY): Payer: Self-pay

## 2022-01-03 NOTE — Telephone Encounter (Signed)
Received a notification from [employer] that patient is not eligible for the patient assistance due to being on medicaid. I left a brief message per DPR that she did not qualify at this time. She was asked to call back with any questions.

## 2022-01-08 ENCOUNTER — Encounter (HOSPITAL_BASED_OUTPATIENT_CLINIC_OR_DEPARTMENT_OTHER): Payer: Medicaid Other | Admitting: Pulmonary Disease

## 2022-01-10 ENCOUNTER — Telehealth: Payer: Self-pay | Admitting: Pulmonary Disease

## 2022-01-10 ENCOUNTER — Ambulatory Visit
Admission: RE | Admit: 2022-01-10 | Discharge: 2022-01-10 | Disposition: A | Discharge status: Home / Self Care | Payer: Medicaid Other | Source: Ambulatory Visit | Attending: Student | Admitting: Student

## 2022-01-10 DIAGNOSIS — J4541 Moderate persistent asthma with (acute) exacerbation: Secondary | ICD-10-CM

## 2022-01-10 MED ORDER — BREZTRI AEROSPHERE 160-9-4.8 MCG/ACT IN AERO: 2.0000 | INHALATION_SPRAY | Freq: Two times a day (BID) | RESPIRATORY_TRACT | 0 refills | Status: DC

## 2022-01-10 NOTE — Telephone Encounter (Signed)
Called and spoke to patient and she wanting samples of Breztri. Samples logged and upfront. Nothing further needed

## 2022-01-11 ENCOUNTER — Ambulatory Visit: Payer: Medicaid Other | Admitting: Allergy & Immunology

## 2022-01-19 ENCOUNTER — Ambulatory Visit: Payer: Medicaid Other | Admitting: Internal Medicine

## 2022-01-23 ENCOUNTER — Ambulatory Visit (HOSPITAL_BASED_OUTPATIENT_CLINIC_OR_DEPARTMENT_OTHER): Payer: Medicaid Other | Attending: Nurse Practitioner | Admitting: Pulmonary Disease

## 2022-01-23 DIAGNOSIS — R0683 Snoring: Secondary | ICD-10-CM | POA: Diagnosis not present

## 2022-01-23 DIAGNOSIS — G4719 Other hypersomnia: Secondary | ICD-10-CM | POA: Diagnosis present

## 2022-01-30 DIAGNOSIS — G4719 Other hypersomnia: Secondary | ICD-10-CM | POA: Diagnosis not present

## 2022-01-30 NOTE — Procedures (Signed)
     Patient Name: Crystal Sampson, Crystal Sampson Date: 01/23/2022 Gender: Female D.O.B: 1976/03/23 Age (years): 46 Referring Provider: Noemi Chapel NP Height (inches): 68 Interpreting Physician: Coralyn Helling MD, ABSM Weight (lbs): 211 RPSGT: Shelah Lewandowsky BMI: 32 MRN: [id number] Neck Size: 15.00  CLINICAL INFORMATION Sleep Study Type: NPSG  Indication for sleep study: Excessive Daytime Sleepiness, Fatigue, Morning Headaches, Obesity, Snoring  Epworth Sleepiness Score: 10  SLEEP STUDY TECHNIQUE As per the AASM Manual for the Scoring of Sleep and Associated Events v2.3 (April 2016) with a hypopnea requiring 4% desaturations.  The channels recorded and monitored were frontal, central and occipital EEG, electrooculogram (EOG), submentalis EMG (chin), nasal and oral airflow, thoracic and abdominal wall motion, anterior tibialis EMG, snore microphone, electrocardiogram, and pulse oximetry.  MEDICATIONS Medications self-administered by patient taken the night of the study : Breztri  SLEEP ARCHITECTURE The study was initiated at 10:21:05 PM and ended at 4:45:10 AM.  Sleep onset time was 12.4 minutes and the sleep efficiency was 87.1%%. The total sleep time was 334.5 minutes.  Stage REM latency was 81.0 minutes.  The patient spent 5.1%% of the night in stage N1 sleep, 77.9%% in stage N2 sleep, 0.0%% in stage N3 and 17% in REM.  Alpha intrusion was absent.  Supine sleep was 47.09%.  RESPIRATORY PARAMETERS The overall apnea/hypopnea index (AHI) was 0.0 per hour. There were 0 total apneas, including 0 obstructive, 0 central and 0 mixed apneas. There were 0 hypopneas and 6 RERAs.  The AHI during Stage REM sleep was 0.0 per hour.  AHI while supine was 0.0 per hour.  The mean oxygen saturation was 96.7%. The minimum SpO2 during sleep was 94.0%.  Moderate snoring was noted during this study.  CARDIAC DATA The 2 lead EKG demonstrated sinus rhythm. The mean heart rate was 67.1  beats per minute. Other EKG findings include: None.  LEG MOVEMENT DATA The total PLMS were 0 with a resulting PLMS index of 0.0. Associated arousal with leg movement index was 0.0 .  IMPRESSIONS - No significant obstructive sleep apnea occurred during this study (AHI = 0.0/h). - The patient had minimal or no oxygen desaturation during the study (Min O2 = 94.0%) - The patient snored with moderate snoring volume.  DIAGNOSIS - Snoring.  RECOMMENDATIONS - Avoid alcohol, sedatives and other CNS depressants that may worsen sleep apnea and disrupt normal sleep architecture. - Sleep hygiene should be reviewed to assess factors that may improve sleep quality. - Weight management and regular exercise should be initiated or continued if appropriate.  [Electronically signed] 01/30/2022 04:46 PM  Coralyn Helling MD, ABSM Diplomate, American Board of Sleep Medicine NPI: [id number]  [facility] PH: [phone]   FX: [phone] ACCREDITED BY THE AMERICAN ACADEMY OF SLEEP MEDICINE

## 2022-02-02 ENCOUNTER — Telehealth: Payer: Self-pay | Admitting: Nurse Practitioner

## 2022-02-05 MED ORDER — BREZTRI AEROSPHERE 160-9-4.8 MCG/ACT IN AERO: 2.0000 | INHALATION_SPRAY | Freq: Two times a day (BID) | RESPIRATORY_TRACT | 0 refills | Status: DC

## 2022-02-05 NOTE — Telephone Encounter (Signed)
Patient verbalized understanding. Patient also asked for breztri samples. Advised patient I would put 2 samples at the front for her.   Nothing further needed.

## 2022-02-19 ENCOUNTER — Encounter: Payer: Self-pay | Admitting: Nurse Practitioner

## 2022-02-19 ENCOUNTER — Telehealth: Payer: Self-pay | Admitting: Nurse Practitioner

## 2022-02-19 ENCOUNTER — Ambulatory Visit (INDEPENDENT_AMBULATORY_CARE_PROVIDER_SITE_OTHER): Payer: Medicaid Other | Admitting: Nurse Practitioner

## 2022-02-19 VITALS — BP 110/72 | HR 70 | Temp 98.4°F | Ht 68.0 in | Wt 210.0 lb

## 2022-02-19 DIAGNOSIS — R0789 Other chest pain: Secondary | ICD-10-CM

## 2022-02-19 DIAGNOSIS — K219 Gastro-esophageal reflux disease without esophagitis: Secondary | ICD-10-CM

## 2022-02-19 DIAGNOSIS — D824 Hyperimmunoglobulin E [IgE] syndrome: Secondary | ICD-10-CM | POA: Diagnosis not present

## 2022-02-19 DIAGNOSIS — J4550 Severe persistent asthma, uncomplicated: Secondary | ICD-10-CM

## 2022-02-19 DIAGNOSIS — J3089 Other allergic rhinitis: Secondary | ICD-10-CM

## 2022-02-19 DIAGNOSIS — J302 Other seasonal allergic rhinitis: Secondary | ICD-10-CM

## 2022-02-19 LAB — SEDIMENTATION RATE: Sed Rate: 11 mm/hr (ref 0–20)

## 2022-02-19 MED ORDER — BUDESONIDE 0.5 MG/2ML IN SUSP: 0.5000 mg | Freq: Two times a day (BID) | RESPIRATORY_TRACT | 5 refills | Status: DC

## 2022-02-19 NOTE — Assessment & Plan Note (Addendum)
Severe asthma with allergic phenotype. She remains poorly controlled with nocturnal symptoms; she does not appear to be in exacerbation today. Her symptoms seem to have gotten progressively worse since she came off [place] in January. She still has significantly elevated IgE levels. Eosinophils are mildly elevated but not greater than 500. No significant opacities on her HRCT so I do not think this is ABPA; although, infiltrates would wax and wane. She did have some fine nodularity, possibly underlying hypersensitivity pneumonitis? We will check ANCA, hypersensitivity pneumonitis panel, and ESR today. Discussed restarting biologic therapy with Tezspire. She was agreeable to this.   Patient Instructions  -Continue Breztri 2 puffs Twice daily. Brush tongue and rinse mouth afterwards, samples given. Please complete the patient assistance application and return  - Continue Albuterol inhaler 2 puffs or 3 mL neb every 6 hours as needed for shortness of breath or wheezing. Notify if symptoms persist despite rescue inhaler/neb use. - Continue flonase 2 sprays each nostril daily  - Continue atrovent 2 sprays each nostril Twice daily   Start Tezspire injections - someone from the pharmacy team will contact you for scheduling Budesonide neb 2 mL Twice daily as needed for chest tightness, shortness of breath or wheezing. Brush tongue and rinse mouth afterwards  Try Pepcid 20 mg over the counter 1 tab At bedtime   Resent referral to Dr. Ernst Bowler with allergy   Referral to cardiology    Follow up in 6 weeks with Dr. Melvyn Novas (1st) or Roxan Diesel, NP (2nd). If symptoms do not improve or worsen, please contact office for sooner follow up or seek emergency care.

## 2022-02-19 NOTE — Assessment & Plan Note (Signed)
She has allergic asthma with environmental triggers. She has not been taking anything for allergies as she doesn't feel like she has many allergy type symptoms. Discussed the correlation between allergies and asthma. Recommended she start OTC antihistamine for trigger prevention.

## 2022-02-19 NOTE — Patient Instructions (Addendum)
-  Continue Breztri 2 puffs Twice daily. Brush tongue and rinse mouth afterwards, samples given. Please complete the patient assistance application and return  - Continue Albuterol inhaler 2 puffs or 3 mL neb every 6 hours as needed for shortness of breath or wheezing. Notify if symptoms persist despite rescue inhaler/neb use. - Continue flonase 2 sprays each nostril daily  - Continue atrovent 2 sprays each nostril Twice daily   Start Tezspire injections - someone from the pharmacy team will contact you for scheduling Budesonide neb 2 mL Twice daily as needed for chest tightness, shortness of breath or wheezing. Brush tongue and rinse mouth afterwards  Try Pepcid 20 mg over the counter 1 tab At bedtime  Over the counter antihistamine such as Zyrtec, Claritin, Xyzal or Allegra daily  Resent referral to Dr. Dellis Anes with allergy   Referral to cardiology    Follow up in 6 weeks with Dr. Sherene Sires (1st) or Rhunette Croft, NP (2nd). If symptoms do not improve or worsen, please contact office for sooner follow up or seek emergency care.

## 2022-02-19 NOTE — Telephone Encounter (Signed)
Created in error

## 2022-02-19 NOTE — Assessment & Plan Note (Signed)
IgE remains significantly elevated; most recently 1496 in June. Eos 263. Previously referred her to allergist in [place], per her request. She was set up with Dr. Dellis Anes for July but never attended this appointment; she does not recall exactly why. We will replace referral today. See above plan.

## 2022-02-19 NOTE — Assessment & Plan Note (Signed)
Suspect that her nocturnal chest discomfort/pain is related to poorly controlled asthma vs GERD, or combination. Advised her to start pepcid at bedtime and evaluate for response to therapy. Avoid eating at least 2-3 hours before bedtime.

## 2022-02-19 NOTE — Progress Notes (Unsigned)
'@Patient'  ID: Crystal Sampson, female    DOB: 14-Nov-1975, 46 y.o.   MRN: [id number]  Chief Complaint  Patient presents with   Follow-up    She is still feeling tired and she is not sleeping well and has trouble breathing at night some and chest tightness. She felt a ton of bricks on her chest Saturday.     Referring provider: No ref. provider found  HPI: 46 year old female, never smoker followed for moderate persistent asthma.  She is a patient of Dr. Gustavus Bryant and was last seen in office on 12/06/2021 by [facility] NP.  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  Followed by allergist.  TEST/EVENTS:  10/21/2020 IgE: 1859 kU/L 02/13/2021 spirometry: FEV1 1.73, ratio 84 06/30/2021 CXR 2 view: Both lungs were clear.  There was no evidence of active cardiopulmonary disease 09/04/2021: Eos 0, IgE 1749; FeNO 19 ppb 01/10/2022 HRCT chest: There is no evidence of fibrotic lung disease. Minimal, bland appearing scarring of the lingula and medial right upper lobe. Background of fine centrilobular nodularity, most concentrated in the lung apices.  01/23/2022 NPSG: AHI 0, SpO2 low 94%  06/30/2021: OV with Traveion Ruddock NP for URI symptoms and associated cough.  Negative for COVID and flu at urgent care.  Continue to have a persistent cough with brown sputum production, nasal congestion rhinorrhea.  Treated for mild asthma exacerbation and rhinosinusitis with 7-day Doxy course and prednisone burst.  Supportive care advised as well.  CXR was clear without any evidence of superimposed infection  09/04/2021: OV with Aideliz Garmany NP for worsening cough and shortness of breath.  She states that over the past week she has had a productive cough that is occasionally with brown sputum and increased shortness of breath with exertion and coughing spells.  She has noticed an occasional wheeze, especially at night.  She has been using her albuterol inhaler 3-6  times a day recently.  She does continue on her Symbicort twice daily.  \She continues on her Fasenra injections but does not feel as though they are working as well as they used to.  She did tell her allergist this to requested that they wait and see if this changes. Exhaled nitric oxide was nl. Eos were 0 but IgE remained significantly elevated. Message sent to Dr. Simona Huh regarding this and pt's symptoms who stated they would bring the patient in to discuss alternative therapies. Treated for upper airway cough syndrome and possible early asthma flare with prednisone burst and cough control measures. Advised to continue Symbicort.   11/02/2021: OV with Onesha Krebbs NP for follow-up.  She also is having some increased shortness of breath over the last few weeks.  Feels like her activity tolerance is just not what it used to be. She notices that her shortness of breath is worse at night and she wakes up feeling a little more winded. Also reports that she snores and has been having worsened fatigue symptoms during the day. Wakes up feeling like she didn't sleep well. Feels like she used to be able to do a lot during the day and now she just wants to go back to bed once she wakes up. She is not sure that this is related to her asthma or not. FeNO nl and no evidence of bronchospasm; stepped up to [facility]. Advised holding off on prednisone but provided with a burst for her to have on hand in case symptoms worsen or do not improve.  Split night ordered for evaluation of fatigue/PND. She did call her allergist after she was seen last and again tell them she didn't feel like the Berna Bue was working. She was scheduled to be seen on 3/20 but the appointment was then rescheduled. She has not been back since and would like to switch her allergist. Referral sent.   11/14/2021: OV with Dr. Verlee Monte. Ended up taking the prednisone burst prescribed at previous visit and felt like her symptoms improved. She also felt like the Judithann Sauger was  helping some but just started the week prior so wasn't entirely sure. Did not appear to be in acute exacerbation but pt requested repeat prednisone course to help her get back to baseline - pred taper prescribed. Given her hard to control asthma and history of very elevated IgE, ordered aspergillus, eosinophil count, IgE and HRCT to ensure there's no evidence of ABPA or alternative asthma mimic. Continue triple therapy with Breztri.   12/06/2021: OV with Krysten Veronica NP for follow up. Her breathing has been stable and she has been feeling well since last course of prednisone and switching to [employer]. Feels like this works better for her. However, it is expensive despite insurance. Her medicaid will cover $300 a month but this still leaves her with $267 she has to pay out of pocket. She denies any wheezing or recent cough. She has HRCT scheduled June 23rd. Labs ordered at last visit were never collected. Feels like her allergies are stable right now. She has a new patient appointment with Dr. Ernst Bowler in July. She has yet to have her split night study. Continues to have daytime fatigue and some PND.   02/19/2022: Today - follow up Patient presents today for overdue follow up. She reports that she has been doing ok since we saw her last. She still has nightly symptoms 1-2 times a week of chest tightness, sometimes pain, and shortness of breath. She will use her neb, which sometimes helps but other times, she still has a crushing chest pain that eventually dissipates on it's own. This never occurs during the day or with exertion. Ongoing for months now. She does occasionally have a wheeze and shortness of breath during the day, usually after vacuuming or doing chores around her house. Denies any cough, fevers, night sweats, hemoptysis, orthopnea, palpitations, syncope, leg swelling. She occasionally has heartburn, which she takes over the counter tums for. Denies any nausea, vomiting, dysphagia. She is using Breztri Twice  daily. She has been off of Fasenra since January and has been treated with prednisone courses at least three times since then. She uses her rescue a few times a week.   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 DX: J45.40 75 mL 3   benzonatate (TESSALON) 200 MG capsule Take 1 capsule (200 mg total) by mouth 3 (three) times daily as needed for cough. 30 capsule 1   Budeson-Glycopyrrol-Formoterol (BREZTRI AEROSPHERE) 160-9-4.8 MCG/ACT AERO Inhale 2 puffs into the lungs in the morning and at bedtime. 10.7 g 5   fluticasone (FLONASE) 50 MCG/ACT nasal spray Place 1 spray into both nostrils daily. 18.2 mL 2   naproxen sodium (ALEVE) 220 MG tablet Take 220 mg by mouth.     Budeson-Glycopyrrol-Formoterol (BREZTRI AEROSPHERE) 160-9-4.8 MCG/ACT AERO Inhale 2 puffs into the lungs in the morning and at bedtime. (Patient not taking: Reported on 02/19/2022) 10.7 g 0   Budeson-Glycopyrrol-Formoterol (BREZTRI AEROSPHERE) 160-9-4.8 MCG/ACT AERO Inhale 2 puffs  into the lungs in the morning and at bedtime. (Patient not taking: Reported on 02/19/2022) 1 each 0   Budeson-Glycopyrrol-Formoterol (BREZTRI AEROSPHERE) 160-9-4.8 MCG/ACT AERO Inhale 2 puffs into the lungs in the morning and at bedtime. (Patient not taking: Reported on 02/19/2022) 10.7 g 0   predniSONE (DELTASONE) 10 MG tablet Take 4 tabs by mouth for 3 days, then 3 for 3 days, 2 for 3 days, 1 for 3 days and stop (Patient not taking: Reported on 02/19/2022) 30 tablet 0   Benralizumab SOSY 30 mg      No facility-administered medications prior to visit.     Review of Systems:   Constitutional: No weight loss or gain, night sweats, fevers, chills, lassitude. +fatigue (unchanged) HEENT: No headaches, difficulty swallowing, tooth/dental problems, or sore throat. No sneezing, itching, ear ache, nasal congestion, or post nasal drip.  CV:  +PND, nocturnal chest pain. No orthopnea, swelling in lower extremities, anasarca, dizziness, palpitations, syncope Resp: +shortness of breath with exertion (baseline); occasional wheeze. No excess mucus or change in color of mucus. No productive or non-productive. No hemoptysis. No chest wall deformity GI:  +heartburn. No abdominal pain, nausea, vomiting, diarrhea, change in bowel habits, loss of appetite, bloody stools.  GU: No dysuria, change in color of urine, urgency or frequency.  No flank pain, no hematuria  Skin: No rash, lesions, ulcerations MSK:  No joint pain or swelling.  No decreased range of motion.  No back pain. Neuro: No dizziness or lightheadedness.  Psych: No depression or anxiety. Mood stable.     Physical Exam:  BP 110/72 (BP Location: Left Arm, Cuff Size: Normal)   Pulse 70   Temp 98.4 F (36.9 C) (Oral)   Ht '5\' 8"'  (1.727 m)   Wt 210 lb (95.3 kg)   SpO2 98%   BMI 31.93 kg/m   GEN: Pleasant, interactive, well-appearing; obese; in no acute distress. HEENT:  Normocephalic and atraumatic. PERRLA. Sclera white. Nasal turbinates pink,  moist and patent bilaterally. No rhinorrhea present. Oropharynx pink and moist, without exudate or edema. No lesions, ulcerations, or postnasal drip.  NECK:  Supple w/ fair ROM. No JVD present. Normal carotid impulses w/o bruits. Thyroid symmetrical with no goiter or nodules palpated. No lymphadenopathy.   CV: RRR, no m/r/g, no peripheral edema. Pulses intact, +2 bilaterally. No cyanosis, pallor or clubbing. PULMONARY:  Unlabored, regular breathing. Clear bilaterally A&P w/o wheezes/rales/rhonchi. No accessory muscle use. No dullness to percussion. GI: BS present and normoactive. Soft, non-tender to palpation.  Neuro: A/Ox3. No focal deficits noted.   Skin: Warm, no lesions or rashes  Psych: Normal affect and behavior. Judgement and thought content appropriate.     Lab Results:  CBC    Component Value Date/Time   WBC 7.1 12/06/2021 1604   RBC 4.11 09/04/2021 1602   HGB 12.9 09/04/2021 1602   HGB 13.5 02/03/2018 1421   HCT 38.3 09/04/2021 1602   HCT 40.2 02/03/2018 1421   PLT 354.0 09/04/2021 1602   PLT 402 02/03/2018 1421   MCV 93.1 09/04/2021 1602   MCV 95 02/03/2018 1421   MCH 31.6 01/04/2021 1949   MCHC 33.6 09/04/2021 1602   RDW 13.3 09/04/2021 1602   RDW 13.7 02/03/2018 1421   LYMPHSABS 2.7 09/04/2021 1602   LYMPHSABS 2.0 02/03/2018 1421   MONOABS 0.5 09/04/2021 1602   EOSABS 263 12/06/2021 1604   EOSABS 0.2 02/03/2018 1421   BASOSABS 0.0 09/04/2021 1602   BASOSABS 0.0 02/03/2018 1421    BMET    Component Value Date/Time   NA 138 01/04/2021 1949   K 3.5 01/04/2021 1949   CL 105 01/04/2021 1949   CO2 25 01/04/2021 1949   GLUCOSE 97 01/04/2021 1949   BUN 9 01/04/2021 1949   CREATININE 0.70 01/04/2021 1949   CALCIUM 9.2 01/04/2021 1949   GFRNONAA >60 01/04/2021 1949   GFRAA >60 12/10/2014 0604    BNP    Component Value Date/Time   BNP 6.6 12/10/2014 0604     Imaging:  SLEEP STUDY DOCUMENTS  Result Date: 01/24/2022 Ordered by an unspecified provider.           No data to display          Lab Results  Component Value Date   NITRICOXIDE 7 12/16/2017        Assessment & Plan:   Severe persistent allergic asthma Severe asthma with allergic phenotype. She remains poorly controlled with nocturnal symptoms; she does not appear to be in exacerbation today. Her symptoms seem to have gotten progressively worse since she came off [place] in January. She still has significantly elevated IgE levels. Eosinophils are mildly elevated but not greater than 500. No significant opacities on her HRCT so I do not think this is ABPA; although, infiltrates would wax and wane. She did have some fine nodularity, possibly underlying hypersensitivity pneumonitis? We will check ANCA, hypersensitivity pneumonitis panel, and ESR today. Discussed restarting biologic therapy with Tezspire. She was agreeable to this.   Patient Instructions  -Continue Breztri 2 puffs Twice daily. Brush tongue and rinse mouth afterwards, samples given. Please complete the patient assistance application and return  - Continue Albuterol inhaler 2 puffs or 3 mL neb every 6 hours as needed for shortness of breath or wheezing. Notify if symptoms persist despite rescue inhaler/neb use. - Continue flonase 2 sprays each nostril daily  - Continue atrovent 2 sprays each nostril Twice daily   Start Tezspire injections - someone from the pharmacy team will contact you for scheduling Budesonide neb 2 mL Twice daily as needed for chest tightness, shortness of breath or wheezing. Brush tongue and rinse mouth afterwards  Try Pepcid 20 mg over the counter 1 tab At bedtime   Resent referral to Dr. Ernst Bowler with allergy   Referral to cardiology    Follow up in 6 weeks with Dr. Melvyn Novas (1st) or Roxan Diesel, NP (2nd). If symptoms do not improve or worsen, please contact office for sooner follow up or seek emergency care.    Seasonal and perennial allergic rhinitis She has allergic asthma with  environmental triggers. She has not  been taking anything for allergies as she doesn't feel like she has many allergy type symptoms. Discussed the correlation between allergies and asthma. Recommended she start OTC antihistamine for trigger prevention.   Hyper-IgE syndrome ([place]) IgE remains significantly elevated; most recently 1496 in June. Eos 263. Previously referred her to allergist in [place], per her request. She was set up with Dr. Ernst Bowler for July but never attended this appointment; she does not recall exactly why. We will replace referral today. See above plan.  GERD (gastroesophageal reflux disease) Suspect that her nocturnal chest discomfort/pain is related to poorly controlled asthma vs GERD, or combination. Advised her to start pepcid at bedtime and evaluate for response to therapy. Avoid eating at least 2-3 hours before bedtime.   Chest pain Likely related to poorly controlled asthma and GERD. See above plan. EKG today nl. We will send her to cardiology for ischemic workup and to rule out cardiac etiology.    I spent 42 minutes of dedicated to the care of this patient on the date of this encounter to include pre-visit review of records, face-to-face time with the patient discussing conditions above, post visit ordering of testing, clinical documentation with the electronic health record, making appropriate referrals as documented, and communicating necessary findings to members of the patients care team.  Clayton Bibles, NP 02/21/2022  Pt aware and understands NP's role.

## 2022-02-20 ENCOUNTER — Telehealth: Payer: Self-pay | Admitting: Pharmacist

## 2022-02-20 DIAGNOSIS — J4550 Severe persistent asthma, uncomplicated: Secondary | ICD-10-CM

## 2022-02-20 NOTE — Telephone Encounter (Signed)
Submitted a Prior Authorization request to [facility] MEDICAID for TEZSPIRE autoinjcetor via CoverMyMeds. Will update once we receive a response.  Key: [id number]  Patient does not qualify for Fast Start program. Tezspire Together enrollment form scanned into patient's chart.  Chesley Mires, PharmD, MPH, BCPS, CPP Clinical Pharmacist (Rheumatology and Pulmonology)

## 2022-02-21 ENCOUNTER — Encounter: Payer: Self-pay | Admitting: Nurse Practitioner

## 2022-02-21 NOTE — Assessment & Plan Note (Addendum)
Likely related to poorly controlled asthma and GERD. See above plan. EKG today nl. We will send her to cardiology for ischemic workup and to rule out cardiac etiology.

## 2022-02-22 ENCOUNTER — Other Ambulatory Visit (HOSPITAL_COMMUNITY): Payer: Self-pay

## 2022-02-22 MED ORDER — TEZSPIRE 210 MG/1.91ML ~~LOC~~ SOAJ
210.0000 mg | SUBCUTANEOUS | 0 refills | Status: DC
  Filled 2022-02-22: qty 1.91, fill #0
  Filled 2022-02-27: qty 1.91, 28d supply, fill #0

## 2022-02-22 NOTE — Telephone Encounter (Signed)
Received notification from [facility] MEDICAID regarding a prior authorization for TEZSPIRE. Authorization has been APPROVED from 02/20/22 to 02/21/23.   Per test claim, copay for 28 days supply is $4  Patient can fill through [facility]: [phone]   Authorization # [id number]  Rx for Tezspire sent to [facility] to be couriered to clinic prior to new start visit on 03/01/22  Chesley Mires, PharmD, MPH, BCPS, CPP Clinical Pharmacist (Rheumatology and Pulmonology)[phone]

## 2022-02-23 ENCOUNTER — Other Ambulatory Visit (HOSPITAL_COMMUNITY): Payer: Self-pay

## 2022-02-23 LAB — HYPERSENSITIVITY PNEUMONITIS
A. Pullulans Abs: NEGATIVE
A.Fumigatus #1 Abs: NEGATIVE
Micropolyspora faeni, IgG: NEGATIVE
Pigeon Serum Abs: NEGATIVE
Thermoact. Saccharii: NEGATIVE
Thermoactinomyces vulgaris, IgG: NEGATIVE

## 2022-02-23 NOTE — Progress Notes (Deleted)
HPI Patient presents today to [facility] to see pharmacy team for [facility] new start.  Past medical history includes severe persistent allergic asthma, seasonal allergic rhinitis, GERD, anxiety, depression, hyper IgE syndrome, obesity. Previously on Harrington Challenger which was managed by asthma/allergy - stopped in January 2023. Since stopping Harrington Challenger, she has had at least three exacerbations required oral steroid bursts.  Number of hospitalizations in past year: Number of ***COPD/asthma exacerbations in past year:   Respiratory Medications Current regimen: *** Tried in past: *** Patient reports {Adherence challenges yes no:[id number]::"adherence challenges","no known adherence challenges"}  OBJECTIVE Allergies  Allergen Reactions   Penicillins Swelling   Outpatient Encounter Medications as of 03/01/2022  Medication Sig   albuterol (PROAIR HFA) 108 (90 Base) MCG/ACT inhaler INHALE 2 PUFFS EVERY 4 HOURS AS NEEDED ONLY IF YOU CANT CATCH YOUR BREATH   albuterol (PROVENTIL) (2.5 MG/3ML) 0.083% nebulizer solution USE 2 VIALS BY NEBULIZATION EVERY 6 (SIX) HOURS AS NEEDED FOR WHEEZING OR SHORTNESS OF BREATH. DX: J45.40   benzonatate (TESSALON) 200 MG capsule Take 1 capsule (200 mg total) by mouth 3 (three) times daily as needed for cough.   Budeson-Glycopyrrol-Formoterol (BREZTRI AEROSPHERE) 160-9-4.8 MCG/ACT AERO Inhale 2 puffs into the lungs in the morning and at bedtime.   budesonide (PULMICORT) 0.5 MG/2ML nebulizer solution Take 2 mLs (0.5 mg total) by nebulization in the morning and at bedtime.   fluticasone (FLONASE) 50 MCG/ACT nasal spray Place 1 spray into both nostrils daily.   naproxen sodium (ALEVE) 220 MG tablet Take 220 mg by mouth.   Tezepelumab-ekko (TEZSPIRE) 210 MG/1. SOAJ Inject 210 mg into the skin every 28 (twenty-eight) days. Courier to pulm: [address], [address], [place] [address]. Appt on 03/01/22   No facility-administered encounter medications on file as of  03/01/2022.    Immunization History  Administered Date(s) Administered   Influenza Split 03/25/2012   Influenza,inj,Quad PF,6+ Mos 03/25/2013, 03/27/2016   PFIZER(Purple Top)SARS-COV-2 Vaccination 10/01/2019, 10/26/2019   Pneumococcal Polysaccharide-23 03/25/2012    PFTs     No data to display          Eosinophils Most recent blood eosinophil count was 263 cells/microL taken on 12/06/2021.   IgE: 1496 on 12/06/2021   Assessment   Biologics training for tezepulumab Dorothea Ogle)  Goals of therapy: Mechanism: human monoclonal IgG2? antibody that binds to TSLP. This blocks TSLP from its effect on inflammation including reduce eosinophils, IgE, FeNO, IL-5, and IL-13. Mechanism is not definitively established. Reviewed that Dorothea Ogle is add-on medication and patient must continue maintenance inhaler regimen. Response to therapy: may take 3-4 months to determine efficacy.  Side effects: injection site reaction (6-18%), antibody development (2%), arthralgia (4%), back pain (4%), pharyngitis (4%)  Dose: Tezspire 210 mg once every 4 weeks  Administration/Storage:  Reviewed administration sites of thigh or abdomen (at least 2-3 inches away from abdomen). Reviewed the upper arm is only appropriate if caregiver is administering injection  Do not shake pen/syringe as this could lead to product foaming or precipitation. Do not shake syringe as this could lead to product foaming or precipitation.  Access: Approval of Tezspire through: insurance  Patient self-administered Tezspire 210mg /1.91 ml in *** using sample Tezspire 210mg /1.91 ml Autoinjector pen NDC: *** Lot: *** Expiration: ***  Patient monitored for 30 minutes for adverse reaction.  Patient tolerated ***. Injection site checked and no ***.  Medication Reconciliation  A drug regimen assessment was performed, including review of allergies, interactions, disease-state management, dosing and immunization history. Medications were  reviewed with the patient, including name, instructions, indication, goals of therapy, potential side effects, importance of adherence, and safe use.  Drug interaction(s): ***  Immunizations  Patient is indicated for the influenzae, pneumonia, and shingles vaccinations. Patient has received *** COVID19 vaccines.  PLAN Continue Tezspire 210mg  SQ every 28 days.  Rx sent to: [facility]: [phone] .   Continue maintenance asthma regimen of: ***  All questions encouraged and answered.  Instructed patient to reach out with any further questions or concerns.  Thank you for allowing pharmacy to participate in this patient's care.  This appointment required *** minutes of patient care (this includes precharting, chart review, review of results, face-to-face care, etc.).  [phone], PharmD, MPH, BCPS, CPP Clinical Pharmacist (Rheumatology and Pulmonology)

## 2022-02-24 LAB — ANCA SCREEN W REFLEX TITER: ANCA SCREEN: NEGATIVE

## 2022-02-27 ENCOUNTER — Other Ambulatory Visit (HOSPITAL_COMMUNITY): Payer: Self-pay

## 2022-02-27 NOTE — Telephone Encounter (Signed)
Delivery instructions have been updated in [place], medication will be couriered to [facility] by 03/01/22.  Rx has been processed in [facility] and there is a copay of $4.00. Since I have been unsuccessful getting CC info from pt, phone number to [facility] has been provided and I've requested she call directly. I've left a note in [place] for her rx to be billed to [facility] account if they do not receive CC info.

## 2022-02-28 ENCOUNTER — Other Ambulatory Visit (HOSPITAL_COMMUNITY): Payer: Self-pay

## 2022-03-01 ENCOUNTER — Other Ambulatory Visit: Payer: Medicaid Other | Admitting: Pharmacist

## 2022-03-01 NOTE — Telephone Encounter (Addendum)
Patient r/s for Tezspire injection on 03/05/22.  She provided one-time debit card information for first fill provided to [facility]. Patient plans to provide CC information to place on file future refills  Medication already received from [facility]. Placed in [place] for new start viist.  Chesley Mires, PharmD, MPH, BCPS, CPP Clinical Pharmacist (Rheumatology and Pulmonology)

## 2022-03-01 NOTE — Telephone Encounter (Signed)
Patient called to reschedule Tezspire injection to a day she is off work (03/05/2022). Patient is requesting a call back about her copay of $4.

## 2022-03-02 NOTE — Progress Notes (Unsigned)
HPI Patient presents today to [facility] to see pharmacy team for [facility] new start.  Past medical history includes severe persistent asthma. 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  She states she is nervous to self-administer injectable medication (has not previously done so). 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   Respiratory Medications Current regimen: Breztri 160/9/4.64mcg (2 puffs twice daily) Tried in past: Harrington Challenger (which was managed by allergy)  OBJECTIVE Allergies  Allergen Reactions   Penicillins Swelling    Outpatient Encounter Medications as of 03/05/2022  Medication Sig   albuterol (PROAIR HFA) 108 (90 Base) MCG/ACT inhaler INHALE 2 PUFFS EVERY 4 HOURS AS NEEDED ONLY IF YOU CANT CATCH YOUR BREATH   albuterol (PROVENTIL) (2.5 MG/3ML) 0.083% nebulizer solution USE 2 VIALS BY NEBULIZATION EVERY 6 (SIX) HOURS AS NEEDED FOR WHEEZING OR SHORTNESS OF BREATH. DX: J45.40   benzonatate (TESSALON) 200 MG capsule Take 1 capsule (200 mg total) by mouth 3 (three) times daily as needed for cough.   Budeson-Glycopyrrol-Formoterol (BREZTRI AEROSPHERE) 160-9-4.8 MCG/ACT AERO Inhale 2 puffs into the lungs in the morning and at bedtime.   budesonide (PULMICORT) 0.5 MG/2ML nebulizer solution Take 2 mLs (0.5 mg total) by nebulization in the morning and at bedtime.   fluticasone (FLONASE) 50 MCG/ACT nasal spray Place 1 spray into both nostrils daily.   naproxen sodium (ALEVE) 220 MG tablet Take 220 mg by mouth.   Tezepelumab-ekko (TEZSPIRE) 210 MG/1. SOAJ Inject 210 mg into the skin every 28 (twenty-eight) days. Courier to pulm: [address], [place] [address]. Appt on 03/01/22   No facility-administered encounter medications on file as of 03/05/2022.     Immunization History  Administered Date(s) Administered   Influenza Split 03/25/2012    Influenza,inj,Quad PF,6+ Mos 03/25/2013, 03/27/2016   PFIZER(Purple Top)SARS-COV-2 Vaccination 10/01/2019, 10/26/2019   Pneumococcal Polysaccharide-23 03/25/2012     PFTs     No data to display           Eosinophils Most recent blood eosinophil count was 263 cells/microL taken on 12/06/21.   IgE: 1496 on 12/06/21   Assessment   Biologics training for tezepulumab Dorothea Ogle)  Goals of therapy: Mechanism: human monoclonal IgG2? antibody that binds to TSLP. This blocks TSLP from its effect on inflammation including reduce eosinophils, IgE, FeNO, IL-5, and IL-13. Mechanism is not definitively established. Reviewed that Dorothea Ogle is add-on medication and patient must continue maintenance inhaler regimen. Response to therapy: may take 3-4 months to determine efficacy.  Side effects: injection site reaction (6-18%), antibody development (2%), arthralgia (4%), back pain (4%), pharyngitis (4%)  Dose: Tezspire 210 mg once every 4 weeks  Administration/Storage:  Reviewed administration sites of thigh or abdomen (at least 2-3 inches away from abdomen). Reviewed the upper arm is only appropriate if caregiver is administering injection  Do not shake pen/syringe as this could lead to product foaming or precipitation. Do not shake syringe as this could lead to product foaming or precipitation.  Access: Approval of Tezspire through: insurance  Patient self-administered Tezspire 210mg /1.91 ml in right abdomen using [facility]-supplied medication: Tezspire 210mg /1.91 ml Autoinjector pen NDC: [phone] Lot: [id number] Expiration: 12/23/2023  Patient monitored for 30 minutes for adverse reaction.  Patient tolerated without issue. She did have some hesitation and had to count down twice to initiate injection. She reported no pain during injection and expressed more confidence and ease with self-administration  moving forward. Injection site checked. Patient report  Medication Reconciliation  A  drug regimen assessment was performed, including review of allergies, interactions, disease-state management, dosing and immunization history. Medications were reviewed with the patient, including name, instructions, indication, goals of therapy, potential side effects, importance of adherence, and safe use.  Drug interaction(s): none noted  Patient inquired if there is an interaction between [place] and THC gummies. I advised that there is very limited data with biologics and OTC/supplemental medications.  PLAN Continue Tezspire 210mg  SQ every 28 days. Next dose due 04/02/22.06/02/22  Rx sent to: [facility]: [phone] . Continue maintenance asthma regimen of: Breztri 160/9/4.48mcg (2 puffs twice daily)  All questions encouraged and answered.  Instructed patient to reach out with any further questions or concerns.  Thank you for allowing pharmacy to participate in this patient's care.  4m, PharmD, MPH, BCPS, CPP Clinical Pharmacist (Rheumatology and Pulmonology)

## 2022-03-05 ENCOUNTER — Other Ambulatory Visit (HOSPITAL_COMMUNITY): Payer: Self-pay

## 2022-03-05 ENCOUNTER — Ambulatory Visit: Payer: Medicaid Other | Admitting: Pharmacist

## 2022-03-05 DIAGNOSIS — J4550 Severe persistent asthma, uncomplicated: Secondary | ICD-10-CM

## 2022-03-05 DIAGNOSIS — Z7189 Other specified counseling: Secondary | ICD-10-CM

## 2022-03-05 MED ORDER — TEZSPIRE 210 MG/1.91ML ~~LOC~~ SOAJ
210.0000 mg | SUBCUTANEOUS | 5 refills | Status: DC
  Filled 2022-03-05 – 2022-03-26 (×2): qty 1.91, 28d supply, fill #0

## 2022-03-05 NOTE — Patient Instructions (Signed)
Your next TEZSPIRE dose is due on 04/02/22, 04/30/22, and every 4 weeks thereafter.  ALWAYS CALL YOUR DOCTORS IF YOU HAVE A CHANGE IN INSURANCE. We are here to help.  CONTINUE BREZTRI  (2 puffs into the lungs in the morning and at bedtime)  Your prescription will be shipped from [facility]. Their phone number is [phone] They will call you to schedule shipment and confirm address. They will mail your medication to your home.  You will need to be seen by your provider in 3 to 4 months to assess how TEZSPIRE is working for you. Please ensure you have a follow-up appointment scheduled in December 2023 and January 2024. Call our clinic if you need to make this appointment.  How to manage an injection site reaction: Remember the 5 C's: COUNTER - leave on the counter at least 30 minutes but up to overnight to bring medication to room temperature. This may help prevent stinging COLD - place something cold (like an ice gel pack or cold water bottle) on the injection site just before cleansing with alcohol. This may help reduce pain CLARITIN - use Claritin (generic name is loratadine) for the first two weeks of treatment or the day of, the day before, and the day after injecting. This will help to minimize injection site reactions CORTISONE CREAM - apply if injection site is irritated and itching CALL ME - if injection site reaction is bigger than the size of your fist, looks infected, blisters, or if you develop hives

## 2022-03-15 ENCOUNTER — Encounter (HOSPITAL_BASED_OUTPATIENT_CLINIC_OR_DEPARTMENT_OTHER): Payer: Self-pay | Admitting: Cardiology

## 2022-03-15 ENCOUNTER — Ambulatory Visit (INDEPENDENT_AMBULATORY_CARE_PROVIDER_SITE_OTHER): Payer: Medicaid Other | Admitting: Cardiology

## 2022-03-15 VITALS — BP 124/86 | HR 72 | Ht 68.0 in | Wt 214.2 lb

## 2022-03-15 DIAGNOSIS — E6609 Other obesity due to excess calories: Secondary | ICD-10-CM | POA: Diagnosis not present

## 2022-03-15 DIAGNOSIS — R079 Chest pain, unspecified: Secondary | ICD-10-CM | POA: Diagnosis not present

## 2022-03-15 DIAGNOSIS — Z7189 Other specified counseling: Secondary | ICD-10-CM

## 2022-03-15 DIAGNOSIS — Z6832 Body mass index (BMI) 32.0-32.9, adult: Secondary | ICD-10-CM

## 2022-03-15 NOTE — Progress Notes (Signed)
Cardiology Office Note:    Date:  03/15/2022   ID:  Waynetta Pean, DOB 1975/09/08, MRN [id number]  PCP:  Pcp, No  Cardiologist:  Jodelle Red, MD  Referring MD: Noemi Chapel, NP   CC: new patient consultation for chest pain  History of Present Illness:    Crystal Sampson is a 46 y.o. female with a hx of GERD, asthma, hyper IgE syndrome on Fasenra, obesity, and depression, who is seen as a new consult at the request of Noemi Chapel, NP for the evaluation and management of atypical chest pain.  She was seen by Micheline Maze, NP on 02/19/2022 where she reported nocturnal chest tightness/pain with shortness of breath. It was felt her chest pain may be related to poorly controlled asthma and GERD. Her EKG at that visit was normal. She was referred to cardiology for ischemic workup and to rule out cardiac etiology.  Chest pain: -Initial onset: She confirms that the pain described at her appointment with Micheline Maze, NP has resolved. At this time her main discomfort is mostly localized more to her right shoulder and back. This has been ongoing for years. In the back of her right shoulder she has noticed a lump. -Duration: Persistent. -Aggravating/alleviating factors: She believes her symptoms may be related to acid reflux. -Prior cardiac history: none -Prior ECG:  At her visit with Micheline Maze, NP 02/19/22 ECG was normal. -Prior workup: She has difficulty sleeping throughout the night, and was previously suffering from PND. She did have a sleep study which was negative for sleep apnea. Every night for the past 2 weeks she drinks cranberry juice at night to help her sleep. She no longer complains of PND but will still wake up in the middle of the night. -Tobacco: Never. However, she notes that she had second-hand smoke from her parents. -Exercise level: She has some stairs at home. She used to exercise with walking frequently. 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 After her shift she develops LE edema and muscle cramps. About 2 months ago she left her job. -Cardiac ROS: no shortness of breath, no orthopnea, no syncope -Family history: Her mother has an irregular heartbeat; she also had a heart attack.  For a year she has been on injection treatments for her allergies.   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 DX: J45.40   Budeson-Glycopyrrol-Formoterol (BREZTRI AEROSPHERE) 160-9-4.8 MCG/ACT AERO Inhale 2 puffs into the lungs in the morning and at bedtime.   fluticasone (FLONASE) 50 MCG/ACT nasal spray Place 1 spray into both nostrils daily.   naproxen sodium (ALEVE) 220 MG tablet Take 220 mg by mouth.   Tezepelumab-ekko (TEZSPIRE) 210 MG/1. SOAJ Inject 210 mg into the skin every 28 (twenty-eight) days.   No current facility-administered medications on  file prior to visit.     Allergies:   Penicillins   Social History   Tobacco Use   Smoking status: Never    Passive exposure: Never   Smokeless tobacco: Never  Vaping Use   Vaping Use: Never used  Substance Use Topics   Alcohol use: No   Drug use: No    Family History: family history includes Glaucoma in her mother; Hypertension in her father. There is no history of Other.  ROS:   Please see the history of present illness.  Additional pertinent  ROS: Constitutional: Negative for chills, fever, night sweats, unintentional weight loss  HENT: Negative for ear pain and hearing loss.   Eyes: Negative for loss of vision and eye pain.  Respiratory: Negative for cough, sputum, wheezing.   Cardiovascular: See HPI. Gastrointestinal: Negative for abdominal pain, melena, and hematochezia.  Genitourinary: Negative for dysuria and hematuria.  Musculoskeletal: Negative for falls. Positive for myalgias, right shoulder and back pain.  Skin: Negative for itching and rash.  Neurological: Negative for focal weakness, focal sensory changes and loss of consciousness.  Endo/Heme/Allergies: Does not bruise/bleed easily.     EKGs/Labs/Other Studies Reviewed:    The following studies were reviewed today:  CT Chest  01/10/2022: FINDINGS: Cardiovascular: No significant vascular findings. Normal heart size. No pericardial effusion.   Mediastinum/Nodes: No enlarged mediastinal, hilar, or axillary lymph nodes. Thyroid gland, trachea, and esophagus demonstrate no significant findings.   Lungs/Pleura: No evidence of fibrotic interstitial lung disease. Minimal, bland appearing scarring of the lingula (series 11, image 203) and medial right upper lobe (series 11, image 138). Background of fine centrilobular nodularity, most concentrated in the lung apices. No significant air trapping on expiratory phase imaging. No pleural effusion or pneumothorax.   Upper Abdomen: No acute abnormality.   Musculoskeletal: No chest wall abnormality. No suspicious osseous lesions identified.  IMPRESSION: 1. No evidence of fibrotic interstitial lung disease. No significant air trapping. 2. Minimal, bland appearing post infectious or inflammatory scarring of the lingula and medial right upper lobe. 3. Background of fine centrilobular nodularity, most concentrated in the lung apices. Findings are most commonly seen in smoking-related respiratory bronchiolitis, however  in the absence of smoking history, additional nonspecific infectious or inhalational inflammatory differential considerations apply, such as hypersensitivity pneumonitis.   EKG:  EKG is personally reviewed.   03/15/2022:  SR with sinus arrhythmia, 72 bpm  Recent Labs: 09/04/2021: Hemoglobin 12.9; Platelets 354.0   Recent Lipid Panel No results found for: "CHOL", "TRIG", "HDL", "CHOLHDL", "VLDL", "LDLCALC", "LDLDIRECT"  Physical Exam:    VS:  BP 124/86 (BP Location: Right Arm, Patient Position: Sitting, Cuff Size: Large)   Pulse 72   Ht 5\' 8"  (1.727 m)   Wt 214 lb 3.2 oz (97.2 kg)   BMI 32.57 kg/m     Wt Readings from Last 3 Encounters:  03/15/22 214 lb 3.2 oz (97.2 kg)  02/19/22 210 lb (95.3 kg)  01/23/22 211 lb (95.7 kg)    GEN: Well nourished, well developed in no acute distress HEENT: Normal, moist mucous membranes NECK: No JVD CARDIAC: regular rhythm, normal S1 and S2, no rubs or gallops. No murmur. VASCULAR: Radial and DP pulses 2+ bilaterally. No carotid bruits RESPIRATORY:  Clear to auscultation without rales, wheezing or rhonchi  ABDOMEN: Soft, non-tender, non-distended MUSCULOSKELETAL:  Ambulates independently; Muscle knots of right shoulder, lesion consistent with lipoma on upper back SKIN: Warm and dry, no edema NEUROLOGIC:  Alert and oriented x 3. No focal neuro deficits  noted. PSYCHIATRIC:  Normal affect    ASSESSMENT:    1. Chest pain of uncertain etiology   2. Class 1 obesity due to excess calories without serious comorbidity with body mass index (BMI) of 32.0 to 32.9 in adult   3. Cardiac risk counseling   4. Counseling on health promotion and disease prevention    PLAN:    Chest pain -discussed treadmill stress, nuclear stress/lexiscan, and CT coronary angiography. Discussed pros and cons of each, including but not limited to false positive/false negative risk, radiation risk, and risk of IV contrast dye. Based on shared decision making, decision was  made to pursue exercise treadmill stress test  Cardiac risk counseling and prevention recommendations: -recommend heart healthy/Mediterranean diet, with whole grains, fruits, vegetable, fish, lean meats, nuts, and olive oil. Limit salt. -recommend moderate walking, 3-5 times/week for 30-50 minutes each session. Aim for at least 150 minutes.week. Goal should be pace of 3 miles/hours, or walking 1.5 miles in 30 minutes -recommend avoidance of tobacco products. Avoid excess alcohol.  Obesity -BMI 32  Plan for follow up: TBD based on results of testing or sooner as needed.  Jodelle Red, MD, PhD, [facility]   [facility]    Medication Adjustments/Labs and Tests Ordered: Current medicines are reviewed at length with the patient today.  Concerns regarding medicines are outlined above.   Orders Placed This Encounter  Procedures   Cardiac Stress Test: Informed Consent Details: Physician/Practitioner Attestation; Transcribe to consent form and obtain patient signature   Exercise Tolerance Test   EKG 12-Lead   ECHOCARDIOGRAM COMPLETE   No orders of the defined types were placed in this encounter.  Patient Instructions  Medication Instructions:  Your physician recommends that you continue on your current medications as directed. Please refer to the Current Medication list given to you today.   Labwork: NONE  Testing/Procedures: Your physician has requested that you have an echocardiogram. Echocardiography is a painless test that uses sound waves to create images of your heart. It provides your doctor with information about the size and shape of your heart and how well your heart's chambers and valves are working. This procedure takes approximately one hour. There are no restrictions for this procedure.  Your physician has requested that you have an exercise tolerance test. For further information please visit [email or web address] Please also follow instruction sheet, as  given.  CALL THE OFFICE AT [phone] TO SCHEDULE ABOVE AFTER YOU HAVE SPOKEN WITH YOUR INSURANCE   Follow-Up: TO BE DETERMINED AFTER ABOVE TESTING COMPLETED       I,Mathew Stumpf,acting as a scribe for [employer], MD.,have documented all relevant documentation on the behalf of Jodelle Red, MD,as directed by  Jodelle Red, MD while in the presence of Jodelle Red, MD.  I, Jodelle Red, MD, have reviewed all documentation for this visit. The documentation on 03/28/22 for the exam, diagnosis, procedures, and orders are all accurate and complete.   Signed, Jodelle Red, MD PhD 03/15/2022     [facility]

## 2022-03-15 NOTE — Patient Instructions (Addendum)
Medication Instructions:  Your physician recommends that you continue on your current medications as directed. Please refer to the Current Medication list given to you today.   Labwork: NONE  Testing/Procedures: Your physician has requested that you have an echocardiogram. Echocardiography is a painless test that uses sound waves to create images of your heart. It provides your doctor with information about the size and shape of your heart and how well your heart's chambers and valves are working. This procedure takes approximately one hour. There are no restrictions for this procedure.  Your physician has requested that you have an exercise tolerance test. For further information please visit [email or web address]. Please also follow instruction sheet, as given.  CALL THE OFFICE AT [phone] TO SCHEDULE ABOVE AFTER YOU HAVE SPOKEN WITH YOUR INSURANCE   Follow-Up: TO BE DETERMINED AFTER ABOVE TESTING COMPLETED

## 2022-03-16 ENCOUNTER — Telehealth (HOSPITAL_BASED_OUTPATIENT_CLINIC_OR_DEPARTMENT_OTHER): Payer: Self-pay | Admitting: Cardiology

## 2022-03-16 NOTE — Telephone Encounter (Signed)
Patient is going to call the insurance company to see if they will pay for the Echocardiogram----wants me to call back in about a week

## 2022-03-21 ENCOUNTER — Telehealth: Payer: Self-pay | Admitting: Internal Medicine

## 2022-03-22 MED ORDER — BREZTRI AEROSPHERE 160-9-4.8 MCG/ACT IN AERO: 2.0000 | INHALATION_SPRAY | Freq: Two times a day (BID) | RESPIRATORY_TRACT | 0 refills | Status: DC

## 2022-03-22 NOTE — Telephone Encounter (Signed)
Spk to patient let her know samples are upfront

## 2022-03-26 ENCOUNTER — Other Ambulatory Visit (HOSPITAL_COMMUNITY): Payer: Self-pay

## 2022-03-29 ENCOUNTER — Other Ambulatory Visit (HOSPITAL_COMMUNITY): Payer: Self-pay

## 2022-04-02 ENCOUNTER — Ambulatory Visit (INDEPENDENT_AMBULATORY_CARE_PROVIDER_SITE_OTHER): Payer: Medicaid Other

## 2022-04-02 DIAGNOSIS — R079 Chest pain, unspecified: Secondary | ICD-10-CM | POA: Diagnosis not present

## 2022-04-02 LAB — ECHOCARDIOGRAM COMPLETE
Area-P 1/2: 4.21 cm2
S' Lateral: 2.38 cm

## 2022-04-04 ENCOUNTER — Telehealth (HOSPITAL_COMMUNITY): Payer: Self-pay | Admitting: *Deleted

## 2022-04-04 NOTE — Telephone Encounter (Signed)
Close encounter 

## 2022-04-05 ENCOUNTER — Ambulatory Visit (HOSPITAL_COMMUNITY)
Admission: RE | Admit: 2022-04-05 | Discharge: 2022-04-05 | Disposition: A | Discharge status: Home / Self Care | Payer: Medicaid Other | Source: Ambulatory Visit | Attending: Internal Medicine | Admitting: Internal Medicine

## 2022-04-05 DIAGNOSIS — R079 Chest pain, unspecified: Secondary | ICD-10-CM | POA: Diagnosis present

## 2022-04-05 LAB — EXERCISE TOLERANCE TEST
Angina Index: 0
Duke Treadmill Score: 8
Estimated workload: 9.5
Exercise duration (min): 7 min
Exercise duration (sec): 39 s
MPHR: 175 {beats}/min
Peak HR: 179 {beats}/min
Percent HR: 102 %
Rest HR: 82 {beats}/min
ST Depression (mm): 0 mm

## 2022-04-09 ENCOUNTER — Encounter: Payer: Self-pay | Admitting: Internal Medicine

## 2022-04-09 ENCOUNTER — Ambulatory Visit (INDEPENDENT_AMBULATORY_CARE_PROVIDER_SITE_OTHER): Payer: Medicaid Other | Admitting: Internal Medicine

## 2022-04-09 DIAGNOSIS — J4550 Severe persistent asthma, uncomplicated: Secondary | ICD-10-CM

## 2022-04-09 MED ORDER — BREZTRI AEROSPHERE 160-9-4.8 MCG/ACT IN AERO: 2.0000 | INHALATION_SPRAY | Freq: Two times a day (BID) | RESPIRATORY_TRACT | 0 refills | Status: DC

## 2022-04-09 NOTE — Progress Notes (Signed)
@Patient  ID: , female    DOB: [birth year] .   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  AP 03/20/2004-total IgE 1977.5 with broad elevation of almost all allergens and extremely high level against dog dander 03/22/2004 04/2012:  FEV1 1.72 (58%), ratio 68 No response to singulair.  - changed advair to [facility] 12/15/2014 > changed to Dulera 100 due to insurance restrictions 07/08/2015  Allergy Profile 07/17/2012-total IgE 1144.9 with elevation of all potential allergens on the panel Allergy Skin Test- 03/25/13- significant positive for grass weed and tree pollens dust mite, cat   07/17/2016 follow-up asthma. NP  Patient returns for a three-month follow-up. Patient says she is doing well with her asthma. She denies any flare of wheezing or cough. Patient does get Dulera through patient assistance program. She says that she will be changing her insurance soon and would like her patient assistance papers filled back out. She does complain of some nasal drip and stuffiness intermittently. She's been using some over-the-counter nasal sprays. Denies any fever or discolored mucus, chest pain, orthopnea, PND, or increased leg swelling. She denies any increased use of albuterol. rec Continue on Dulera 2 puffs Twice daily  , rinse after use.  Saline nasal rinses As needed   Begin Flonase 1 puff Twice daily       01/20/2021  f/u ov/Crystal Sampson re: asthma maint on symb/ singulair /  has no ac where she lieves Chief Complaint  Patient presents with   Follow-up    Hyper-IGE syndrome, She reports having some panic attacks and chest pains and was checked out and not sure if this is related to her breathing.  Dyspnea:  no change / still walking dog  Cough: p hs and on off nightly Sleeping: bed is flat/ on side  SABA use: neb once or twice a week in  addition to freq hfa  02: none  Covid status:   vax x 2  No ongoing cp's Rec    We will be referring you to allergy   > rec fasenra  02/14/21 but had "falling out" after missed appt       04/09/2022  f/u ov/Crystal Sampson re: asthma   maint on tespire /breztri thru this office  Chief Complaint  Patient presents with   Asthma    Asthma under control per pt.  Seen cardiologist. Tests normal   Dyspnea:  walking dog fine  Cough: none  Sleeping: flat bed  SABA use: not needing  02: none      No obvious day to day or daytime variability or assoc excess/ purulent sputum or mucus plugs or hemoptysis or cp or chest tightness, subjective wheeze or overt sinus or hb symptoms.   Sleeping  without nocturnal  or early am exacerbation  of respiratory  c/o's or need for noct saba. Also denies any obvious fluctuation of symptoms with weather or environmental changes or other aggravating or alleviating factors except as outlined above   No unusual exposure hx or h/o childhood pna/ asthma or knowledge of premature birth.  Current Allergies, Complete Past Medical History, Past Surgical History, Family History, and Social History were reviewed in 06-04-1998 record.  ROS  The following are not active complaints unless bolded Hoarseness, sore throat, dysphagia, dental problems, itching,  sneezing,  nasal congestion or discharge of excess mucus or purulent secretions, ear ache,   fever, chills, sweats, unintended wt loss or wt gain, classically pleuritic or exertional cp,  orthopnea pnd or arm/hand swelling  or leg swelling, presyncope, palpitations, abdominal pain, anorexia, nausea, vomiting, diarrhea  or change in bowel habits or change in bladder habits, change in stools or change in urine, dysuria, hematuria,  rash, arthralgias, visual complaints, headache, numbness, weakness or ataxia or problems with walking or coordination,  change in mood or  memory.        Current Meds  Medication  Sig   albuterol (PROAIR HFA) 108 (90 Base) MCG/ACT inhaler INHALE 2 PUFFS EVERY 4 HOURS AS NEEDED ONLY IF YOU CANT CATCH YOUR BREATH   Budeson-Glycopyrrol-Formoterol (BREZTRI AEROSPHERE) 160-9-4.8 MCG/ACT AERO Inhale 2 puffs into the lungs in the morning and at bedtime.   naproxen sodium (ALEVE) 220 MG tablet Take 220 mg by mouth.                  Physical Exam  01/20/2021      196   10/21/2020     199 06/12/2019    169 03/11/2019       166  02/09/2019       155  11/05/2018       150  10/22/2018       154  01/13/2018       162  12/16/2017       166   04/17/2017    198   02/14/17 204 lb 12.8 oz (92.9 kg)  07/17/16 208 lb 3.2 oz (94.4 kg)  03/27/16 178 lb (80.7 kg)    Vital signs reviewed  04/09/2022  - Note at rest 02 sats  100% on RA   General appearance:    amb obese bf nad   HEENT : Oropharynx  clear      Nasal turbinates nl    NECK :  without  apparent JVD/ palpable Nodes/TM    LUNGS: no acc muscle use,  Nl contour chest which is clear to A and P bilaterally without cough on insp or exp maneuvers   CV:  RRR  no s3 or murmur or increase in P2, and no edema   ABD:  soft and nontender with nl inspiratory excursion in the supine position. No bruits or organomegaly appreciated   MS:  Nl gait/ ext warm without deformities Or obvious joint restrictions  calf tenderness, cyanosis or clubbing    SKIN: warm and dry without lesions    NEURO:  alert, approp, nl sensorium with  no motor or cerebellar deficits apparent.       Assessment & Plan:

## 2022-04-09 NOTE — Assessment & Plan Note (Signed)
Onset childhood Spiro 2001: FEV1 2.38 (68%), ratio 80. AP 03/20/2004  IgE 1877, RAST very abnormal 2007. Aspergillus CF <1:8 Arlyce Harman 04/2012:  FEV1 1.72 (58%), ratio 68 No response to singulair.  Allergy Skin Test- 03/25/13- significant positive for grass weed and tree pollens dust mite, cat - changed advair to BREO 12/15/2014 > changed to [facility] 100 due to insurance restrictions 07/08/2015  - 04/17/2017  extensive coaching HFA effectiveness =    75%  From a baseline of 50% - Spirometry 12/16/2017  FEV1 2.14 (74%)  Ratio 79 p am dulera 100 x 2  - FENO 12/16/2017  =   7 p dulera 100 x 2  - 12/16/2017  After extensive coaching inhaler device  effectiveness =    75% and 6 weeks iup > change to symb  80 2bid   - spring 2020 new dog in house  - 10/22/2018   change to symbicort 160  2 bid and rx pred x 6 days  - 03/11/2019  After extensive coaching inhaler device,  effectiveness =  75%    (short ti)  - 10/21/2020  After extensive coaching inhaler device,  effectiveness =  50% (late trigger)  - 10/21/2020 added singulair  -Allergy profile 12/31/20 >  Eos 0.3 /  IgE 2259   - 01/20/2021  After extensive coaching inhaler device,  effectiveness =    60% (trigger was delayed and short ti)  - 01/20/2021 referred to allergy > rec fasenra but "didn't work"   Started tespire 02/19/22 and breztri thru [place] [place]  - The proper method of use, as well as anticipated side effects, of a metered-dose inhaler were discussed and demonstrated to the patient using teach back method. Improved effectiveness after extensive coaching during this visit to a level of approximately 75 % from a baseline of 25 %  > continue breztri if can get  It  [place]ru [place] o/[place] change back to symbicort 160 2bid/ advised         Each maintenance medication was reviewed in detail including emphasizing most importantly the difference between maintenance and prns and under what circumstances the prns are to be triggered using an action plan format where  appropriate.  Total time for H and P, chart review, counseling, reviewing hfa/neb device(s) and generating customized AVS unique to this office visit / same day charting =  30 min

## 2022-04-09 NOTE — Patient Instructions (Addendum)
Plan A = Automatic = Always=   Breztri  Take 2 puffs first thing in am and then another 2 puffs about 12 hours later / tespire injections  Work on inhaler technique:  relax and gently blow all the way out then take a nice smooth full deep breath back in, triggering the inhaler at same time you start breathing in.  Hold breath in for at least  5 seconds if you can. Blow out breztri  thru nose. Rinse and gargle with water when done.  If mouth or throat bother you at all,  try brushing teeth/gums/tongue with arm and hammer toothpaste/ make a slurry and gargle and spit out.  - use empty breztri device to practice     Plan B = Backup (to supplement plan A, not to replace it) Only use your albuterol inhaler as a rescue medication to be used if you can't catch your breath by resting or doing a relaxed purse lip breathing pattern.  - The less you use it, the better it will work when you need it. - Ok to use the inhaler up to 2 puffs  every 4 hours if you must but call for appointment if use goes up over your usual need - Don't leave home without it !!  (think of it like the spare tire for your car)   Plan C = Crisis (instead of Plan B but only if Plan B stops working) - only use your albuterol nebulizer if you first try Plan B and it fails to help > ok to use the nebulizer up to every 4 hours but if start needing it regularly call for immediate appointment  Please schedule a follow up office visit in 6 weeks, call sooner if needed with Roxan Diesel NP

## 2022-04-16 ENCOUNTER — Other Ambulatory Visit (HOSPITAL_COMMUNITY): Payer: Self-pay

## 2022-04-17 ENCOUNTER — Other Ambulatory Visit (HOSPITAL_COMMUNITY): Payer: Self-pay

## 2022-04-19 ENCOUNTER — Other Ambulatory Visit (HOSPITAL_COMMUNITY): Payer: Self-pay

## 2022-04-25 ENCOUNTER — Other Ambulatory Visit (HOSPITAL_COMMUNITY): Payer: Self-pay

## 2022-05-08 ENCOUNTER — Telehealth: Payer: Self-pay | Admitting: Nurse Practitioner

## 2022-05-08 MED ORDER — BREZTRI AEROSPHERE 160-9-4.8 MCG/ACT IN AERO: 2.0000 | INHALATION_SPRAY | Freq: Two times a day (BID) | RESPIRATORY_TRACT | 0 refills | Status: DC

## 2022-05-08 NOTE — Telephone Encounter (Signed)
Spoke with pt and placed Breztri sample up front for pick up. Attempted to call [employer] to get update on pt assistance was on hold for  10    mins. Will try again at later time. Nothing further needed at this time.

## 2022-05-08 NOTE — Telephone Encounter (Signed)
Patient called to ask if she could pick up some more samples of Breztri.  Please call patient to let her know if this is possible.  CB# [phone]

## 2022-05-16 ENCOUNTER — Telehealth: Payer: Self-pay | Admitting: Internal Medicine

## 2022-05-16 NOTE — Telephone Encounter (Signed)
Called and spoke to patient and let her know she can pick up Breztri samples at office. Nothing further needed

## 2022-05-22 ENCOUNTER — Ambulatory Visit: Payer: Medicaid Other | Admitting: Nurse Practitioner

## 2022-06-01 ENCOUNTER — Other Ambulatory Visit (HOSPITAL_COMMUNITY): Payer: Self-pay

## 2022-06-08 ENCOUNTER — Telehealth: Payer: Self-pay | Admitting: Internal Medicine

## 2022-06-08 ENCOUNTER — Other Ambulatory Visit (HOSPITAL_COMMUNITY): Payer: Self-pay

## 2022-06-08 ENCOUNTER — Other Ambulatory Visit: Payer: Self-pay | Admitting: *Deleted

## 2022-06-08 MED ORDER — BUDESONIDE-FORMOTEROL FUMARATE 160-4.5 MCG/ACT IN AERO: 2.0000 | INHALATION_SPRAY | Freq: Two times a day (BID) | RESPIRATORY_TRACT | 11 refills | Status: DC

## 2022-06-08 NOTE — Telephone Encounter (Signed)
We are currently out of Breztri samples  She verbalized understanding She is asking for another inhaler bc breztri costs over $500 and she has still not heard back about pt assistance  She is asking about going back on symbicort  Pharm team- can you tell how much it would be for her to get the symbicort?

## 2022-06-08 NOTE — Telephone Encounter (Signed)
Ok to use symbicort 160

## 2022-06-08 NOTE — Telephone Encounter (Signed)
Per benefits investigation Brand Symbicort is covered at a $4.00 co-pay through [employer] *prices may vary.

## 2022-06-08 NOTE — Telephone Encounter (Signed)
Called and spoke with patient, advised of recommendations per Dr. Sherene Sires.  Verified pharmacy and prescription for Symbicort 160 sent to pharmacy.  Noting further needed.

## 2022-06-08 NOTE — Telephone Encounter (Signed)
Dr Sherene Sires- can we just send her the symbicort since [place] not affordable? Thanks!

## 2022-07-03 ENCOUNTER — Ambulatory Visit: Payer: Medicaid Other | Admitting: Internal Medicine

## 2022-07-03 NOTE — Progress Notes (Deleted)
@Patient  ID: , female    DOB: 1976/05/31 .   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  AP 03/20/2004-total IgE 1977.5 with broad elevation of almost all allergens and extremely high level against dog dander 03/22/2004 04/2012:  FEV1 1.72 (58%), ratio 68 No response to singulair.  - changed advair to [facility] 12/15/2014 > changed to Dulera 100 due to insurance restrictions 07/08/2015  Allergy Profile 07/17/2012-total IgE 1144.9 with elevation of all potential allergens on the panel Allergy Skin Test- 03/25/13- significant positive for grass weed and tree pollens dust mite, cat   07/17/2016 follow-up asthma. NP  Patient returns for a three-month follow-up. Patient says she is doing well with her asthma. She denies any flare of wheezing or cough. Patient does get Dulera through patient assistance program. She says that she will be changing her insurance soon and would like her patient assistance papers filled back out. She does complain of some nasal drip and stuffiness intermittently. She's been using some over-the-counter nasal sprays. Denies any fever or discolored mucus, chest pain, orthopnea, PND, or increased leg swelling. She denies any increased use of albuterol. rec Continue on Dulera 2 puffs Twice daily  , rinse after use.  Saline nasal rinses As needed   Begin Flonase 1 puff Twice daily       01/20/2021  f/u ov/Crystal Sampson re: asthma maint on symb/ singulair /  has no ac where she lieves Chief Complaint  Patient presents with   Follow-up    Hyper-IGE syndrome, She reports having some panic attacks and chest pains and was checked out and not sure if this is related to her breathing.  Dyspnea:  no change / still walking dog  Cough: p hs and on off nightly Sleeping: bed is flat/ on side  SABA use: neb once or twice a week in  addition to freq hfa  02: none  Covid status:   vax x 2  No ongoing cp's Rec    We will be referring you to allergy   > rec fasenra  02/14/21 but had "falling out" after missed appt    04/09/2022  f/u ov/Crystal Sampson re: asthma   maint on tespire /breztri thru this office  Chief Complaint  Patient presents with   Asthma    Asthma under control per pt.  Seen cardiologist. Tests normal   Dyspnea:  walking dog fine  Cough: none  Sleeping: flat bed  SABA use: not needing  02: none  Rec Plan A = Automatic = Always=   Breztri  Take 2 puffs first thing in am and then another 2 puffs about 12 hours later / tespire injections Work on inhaler technique:   Plan B = Backup (to supplement plan A, not to replace it) Only use your albuterol inhaler as a rescue medication Plan C = Crisis (instead of Plan B but only if Plan B stops working) - only use your albuterol nebulizer if you first try Plan B     07/03/2022  f/u ov/Crystal Sampson re: ***   maint on ***  No chief complaint on file.   Dyspnea:  *** Cough: *** Sleeping: *** SABA use: *** 02: *** Covid status:   *** Lung cancer screening :  ***    No obvious day to day  or daytime variability or assoc excess/ purulent sputum or mucus plugs or hemoptysis or cp or chest tightness, subjective wheeze or overt sinus or hb symptoms.   *** without nocturnal  or early am exacerbation  of respiratory  c/o's or need for noct saba. Also denies any obvious fluctuation of symptoms with weather or environmental changes or other aggravating or alleviating factors except as outlined above   No unusual exposure hx or h/o childhood pna/ asthma or knowledge of premature birth.  Current Allergies, Complete Past Medical History, Past Surgical History, Family History, and Social History were reviewed in [employer] record.  ROS  The following are not active complaints unless bolded Hoarseness, sore throat, dysphagia, dental problems, itching,  sneezing,  nasal congestion or discharge of excess mucus or purulent secretions, ear ache,   fever, chills, sweats, unintended wt loss or wt gain, classically pleuritic or exertional cp,  orthopnea pnd or arm/hand swelling  or leg swelling, presyncope, palpitations, abdominal pain, anorexia, nausea, vomiting, diarrhea  or change in bowel habits or change in bladder habits, change in stools or change in urine, dysuria, hematuria,  rash, arthralgias, visual complaints, headache, numbness, weakness or ataxia or problems with walking or coordination,  change in mood or  memory.        No outpatient medications have been marked as taking for the 07/03/22 encounter (Appointment) with Tanda Rockers, MD.                    Physical Exam  07/03/2022         ***  01/20/2021      196   10/21/2020     199 06/12/2019    169 03/11/2019       166  02/09/2019       155  11/05/2018       150  10/22/2018       154  01/13/2018       162  12/16/2017       166   04/17/2017    198   02/14/17 204 lb 12.8 oz (92.9 kg)  07/17/16 208 lb 3.2 oz (94.4 kg)  03/27/16 178 lb (80.7 kg)      Vital signs reviewed  07/03/2022  - Note at rest 02 sats  ***% on ***   General appearance:    ***       Assessment & Plan:

## 2022-08-01 ENCOUNTER — Telehealth: Payer: Self-pay | Admitting: Internal Medicine

## 2022-08-02 ENCOUNTER — Other Ambulatory Visit: Payer: Self-pay | Admitting: Internal Medicine

## 2022-08-02 MED ORDER — PREDNISONE 10 MG PO TABS: ORAL_TABLET | ORAL | 0 refills | Status: DC
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
## 2022-08-02 NOTE — Telephone Encounter (Signed)
Sorry to hear about this:  If the breathing gets better with albuterol then it's likely asthma, if not, she will need evaluation probably back where she went if not space here.  If it's more likely just  asthma cause it responds temporarily then ok to rx  > Prednisone 10 mg take  4 each am x 2 days,   2 each am x 2 days,  1 each am x 2 days and stop

## 2022-08-02 NOTE — Telephone Encounter (Signed)
Called and spoke with patient. She verbalized understanding and wishes to try the prednisone. RX has been sent to pharmacy.   Nothing further needed at time of call.

## 2022-08-20 ENCOUNTER — Other Ambulatory Visit: Payer: Self-pay | Admitting: *Deleted

## 2022-08-20 MED ORDER — ALBUTEROL SULFATE HFA 108 (90 BASE) MCG/ACT IN AERS: INHALATION_SPRAY | RESPIRATORY_TRACT | 3 refills | Status: DC

## 2022-08-21 ENCOUNTER — Emergency Department (HOSPITAL_COMMUNITY)
Admission: EM | Admit: 2022-08-21 | Discharge: 2022-08-22 | Disposition: A | Discharge status: Home / Self Care | Payer: Medicaid Other | Attending: Emergency Medicine | Admitting: Emergency Medicine

## 2022-08-21 ENCOUNTER — Ambulatory Visit (HOSPITAL_COMMUNITY)
Admission: EM | Admit: 2022-08-21 | Discharge: 2022-08-21 | Disposition: A | Discharge status: Home / Self Care | Payer: No Typology Code available for payment source | Source: Ambulatory Visit | Attending: Emergency Medicine | Admitting: Emergency Medicine

## 2022-08-21 ENCOUNTER — Encounter (HOSPITAL_COMMUNITY): Payer: Self-pay | Admitting: Emergency Medicine

## 2022-08-21 DIAGNOSIS — T7421XA Adult sexual abuse, confirmed, initial encounter: Secondary | ICD-10-CM | POA: Diagnosis present

## 2022-08-21 DIAGNOSIS — Z7951 Long term (current) use of inhaled steroids: Secondary | ICD-10-CM | POA: Insufficient documentation

## 2022-08-21 DIAGNOSIS — J45909 Unspecified asthma, uncomplicated: Secondary | ICD-10-CM | POA: Diagnosis not present

## 2022-08-21 DIAGNOSIS — T7621XA Adult sexual abuse, suspected, initial encounter: Secondary | ICD-10-CM | POA: Diagnosis not present

## 2022-08-21 DIAGNOSIS — Z0441 Encounter for examination and observation following alleged adult rape: Secondary | ICD-10-CM | POA: Diagnosis present

## 2022-08-21 LAB — COMPREHENSIVE METABOLIC PANEL
ALT: 13 U/L (ref 0–44)
AST: 22 U/L (ref 15–41)
Albumin: 3.6 g/dL (ref 3.5–5.0)
Alkaline Phosphatase: 46 U/L (ref 38–126)
Anion gap: 11 (ref 5–15)
BUN: 9 mg/dL (ref 6–20)
CO2: 23 mmol/L (ref 22–32)
Calcium: 9.6 mg/dL (ref 8.9–10.3)
Chloride: 105 mmol/L (ref 98–111)
Creatinine, Ser: 0.95 mg/dL (ref 0.44–1.00)
GFR, Estimated: >60 mL/min (ref >60)
Glucose, Bld: 119 mg/dL — ABNORMAL HIGH (ref 70–99)
Potassium: 3.5 mmol/L (ref 3.5–5.1)
Sodium: 139 mmol/L (ref 135–145)
Total Bilirubin: 0.7 mg/dL (ref 0.3–1.2)
Total Protein: 7.4 g/dL (ref 6.5–8.1)

## 2022-08-21 LAB — RAPID HIV SCREEN (HIV 1/2 AB+AG)
HIV 1/2 Antibodies: NONREACTIVE
HIV-1 P24 Antigen - HIV24: NONREACTIVE

## 2022-08-21 LAB — HEPATITIS B SURFACE ANTIGEN: Hepatitis B Surface Ag: NONREACTIVE

## 2022-08-21 LAB — HEPATITIS C ANTIBODY: HCV Ab: NONREACTIVE

## 2022-08-21 MED ORDER — LIDOCAINE HCL (PF) 1 % IJ SOLN
1.0000 mL | Freq: Once | INTRAMUSCULAR | Status: AC
  Administered 2022-08-22: 1 mL

## 2022-08-21 MED ORDER — DOXYCYCLINE HYCLATE 100 MG PO TABS: 100.0000 mg | ORAL_TABLET | Freq: Once | ORAL | Status: DC

## 2022-08-21 MED ORDER — ELVITEG-COBIC-EMTRICIT-TENOFAF 150-150-200-10 MG PO TABS
1.0000 | ORAL_TABLET | Freq: Every day | ORAL | 0 refills | Status: DC
  Filled 2022-08-21: qty 30, 30d supply, fill #0

## 2022-08-21 MED ORDER — CEFTRIAXONE SODIUM 500 MG IJ SOLR
500.0000 mg | Freq: Once | INTRAMUSCULAR | Status: AC
  Administered 2022-08-22: 500 mg via INTRAMUSCULAR

## 2022-08-21 MED ORDER — AZITHROMYCIN 250 MG PO TABS
1000.0000 mg | ORAL_TABLET | Freq: Once | ORAL | Status: AC
  Administered 2022-08-22: 1000 mg via ORAL

## 2022-08-21 MED ORDER — ELVITEG-COBIC-EMTRICIT-TENOFAF 150-150-200-10 MG PREPACK: 1.0000 | ORAL_TABLET | Freq: Once | ORAL | Status: DC

## 2022-08-21 MED ORDER — ULIPRISTAL ACETATE 30 MG PO TABS
30.0000 mg | ORAL_TABLET | Freq: Once | ORAL | Status: AC
  Administered 2022-08-22: 30 mg via ORAL

## 2022-08-21 MED ORDER — METRONIDAZOLE 500 MG PO TABS
2000.0000 mg | ORAL_TABLET | Freq: Once | ORAL | Status: AC
  Administered 2022-08-22: 2000 mg via ORAL

## 2022-08-21 MED ORDER — ELVITEG-COBIC-EMTRICIT-TENOFAF 150-150-200-10 MG PO TABS: 1.0000 | ORAL_TABLET | Freq: Every day | ORAL | 0 refills | Status: DC
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
## 2022-08-21 NOTE — ED Notes (Signed)
Forensic RN Gerri to D/C patient.,
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
## 2022-08-21 NOTE — ED Notes (Signed)
Scientist, physiological has arrived and is speaking with patient at this time.

## 2022-08-21 NOTE — Discharge Instructions (Signed)
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 directions on the prescription label. The tablets can be taken with food or on an empty stomach. If the medication upsets your stomach, take it with food. Take your medication at regular intervals. Do not take your medication more often than directed. Take all of your medication as directed even if you think you are better. Do not skip doses or stop your medication early. Talk to your care team regarding the use of this medication in children. While this medication may be prescribed for children as young as 6 months for selected conditions, precautions do apply. Overdosage: If you think you have taken too much of this medicine contact a poison control center or emergency room at once. NOTE: This medicine is only for you. Do not share this medicine with others. What if I  miss a dose? If you miss a dose, take it as soon as you can. If it is almost time for your next dose, take only that dose. Do not take double or extra doses. What may interact with this medication? Do not take this medication with any of the following: Cisapride Dronedarone Pimozide Thioridazine This medication may also interact with the following: Antacids that contain aluminum or magnesium Birth control pills Colchicine Cyclosporine Digoxin Ergot alkaloids like dihydroergotamine, ergotamine Nelfinavir Other medications that prolong the QT interval (an abnormal heart rhythm) Phenytoin Warfarin This list may not describe all possible interactions. Give your health care provider a list of all the medicines, herbs, non-prescription drugs, or dietary supplements you use. Also tell them if you smoke, drink alcohol, or use illegal drugs. Some items may interact with your medicine. What should I watch for while using this medication? Tell your care team if your symptoms do not start to get better or if they get worse. This medication may cause serious skin reactions. They can happen weeks to months after starting the medication. Contact your care team right away if you notice fevers or flu-like symptoms with a rash. The rash may be red or purple and then turn into blisters or peeling of the skin. Or, you might notice a red rash with swelling of the face, lips or lymph nodes in your neck or under your arms. Do not treat diarrhea with over the counter products. Contact your care team if you have diarrhea that lasts more than 2 days or if it is severe and watery. This medication can make you more sensitive to the sun. Keep out of the sun. If you cannot avoid being in the sun, wear protective clothing and use sunscreen. Do not use sun lamps or tanning beds/booths. What side effects may I notice from receiving this medication? Side effects that you should report to your care team as soon as  possible: Allergic reactions or angioedema-skin rash, itching, hives, swelling of the face, eyes, lips,  tongue, arms, or legs, trouble swallowing or breathing Heart rhythm changes-fast or irregular heartbeat, dizziness, feeling faint or lightheaded, chest pain, trouble breathing Liver injury-right upper belly pain, loss of appetite, nausea, light-colored stool, dark yellow or brown urine, yellowing skin or eyes, unusual weakness or fatigue Rash, fever, and swollen lymph nodes Redness, blistering, peeling, or loosening of the skin, including inside the mouth Severe diarrhea, fever Unusual vaginal discharge, itching, or odor Side effects that usually do not require medical attention (report to your care team if they continue or are bothersome): Diarrhea Nausea Stomach pain Vomiting This list may not describe all possible side effects. Call your doctor for medical advice about side effects. You may report side effects to FDA at 1-800-FDA-1088. Where should I keep my medication? Keep out of the reach of children and pets. Store at room temperature between 15 and 30 degrees C (59 and 86 degrees F). Throw away any unused medication after the expiration date. NOTE: This sheet is a summary. It may not cover all possible information. If you have questions about this medicine, talk to your doctor, pharmacist, or health care provider.  2022 [employer] Standard (2020-05-04 11:19:31)         '[x]'$    Flagyl (Metronidazole) Capsules or Tablets               TAKE ALL 4 PILLS AT THE SAME TIME.  DO NOT TAKE THIS MEDICATION FOR 3 DAYS (72 HOURS) AFTER DRINKING ALCOHOL,  AND DO NOT DRINK ANY ALCOHOL FOR 3 DAYS (72 HOURS) AFTER YOU TAKE THIS MEDICATION !   What is this medication? METRONIDAZOLE (me troe NI da zole) treats infections caused by bacteria or parasites. It belongs to a group of medications called antibiotics. It will not treat colds, the flu, or infections caused by viruses. This medicine may be  used for other purposes; ask your health care provider or pharmacist if you have questions. COMMON BRAND NAME(S): Flagyl What should I tell my care team before I take this medication? They need to know if you have any of these conditions: Cockayne syndrome History of blood diseases such as sickle cell anemia, anemia, or leukemia If you often drink alcohol Irregular heartbeat or rhythm Kidney disease Liver disease Yeast or fungal infection An unusual or allergic reaction to metronidazole, nitroimidazoles, or other medications, foods, dyes, or preservatives Pregnant or trying to get pregnant Breast-feeding How should I use this medication? Take this medication by mouth with water. Take it as directed on the prescription label at the same time every day. Take all of this medication unless your care team tells you to stop it early. Keep taking it even if you think you are better. Talk to your care team about the use of this medication in children. While it may be prescribed for children for selected conditions, precautions do apply. Overdosage: If you think you have taken too much of this medicine contact a poison control center or emergency room at once. NOTE: This medicine is only for you. Do not share this medicine with others. What if I miss a dose? If you miss a dose, take it as soon as you can. If it is almost time for your next dose, take only that dose. Do not take double or extra doses. What may interact with this medication? Do not take this medication with any of the following: Alcohol or any product that contains alcohol Cisapride Disulfiram Dronedarone Pimozide Thioridazine This medication may also interact with the following: Birth control  pills Busulfan Carbamazepine Certain medications that treat or prevent blood clots like warfarin Cimetidine Lithium Other medications that prolong the QT interval (cause an abnormal heart rhythm) Phenobarbital Phenytoin This list  may not describe all possible interactions. Give your health care provider a list of all the medicines, herbs, non-prescription drugs, or dietary supplements you use. Also tell them if you smoke, drink alcohol, or use illegal drugs. Some items may interact with your medicine. What should I watch for while using this medication? Tell your care team if your symptoms do not start to get better or if they get worse. Some products may contain alcohol. Ask your care team if this medication contains alcohol. Be sure to tell all care teams you are taking this medication. Certain medications, such as metronidazole and disulfiram, can cause an unpleasant reaction when taken with alcohol. The reaction includes flushing, headache, nausea, vomiting, sweating, and increased thirst. The reaction can last from 30 minutes to several hours. If you are being treated for a sexually transmitted disease (STD), avoid sexual contact until you have finished your treatment. Your sexual partner may also need treatment. Birth control may not work properly while you are taking this medication. Talk to your care team about using an extra method of birth control. What side effects may I notice from receiving this medication? Side effects that you should report to your care team as soon as possible: Allergic reactions-skin rash, itching, hives, swelling of the face, lips, tongue, or throat Dizziness, loss of balance or coordination, confusion or trouble speaking Fever, neck pain or stiffness, sensitivity to light, headache, nausea, vomiting, confusion Heart rhythm changes-fast or irregular heartbeat, dizziness, feeling faint or lightheaded, chest pain, trouble breathing Liver injury-right upper belly pain, loss of appetite, nausea, light-colored stool, dark yellow or brown urine, yellowing skin or eyes, unusual weakness or fatigue Pain, tingling, or numbness in the hands or feet Redness, blistering, peeling, or loosening of the skin,  including inside the mouth Seizures Severe diarrhea, fever Sudden eye pain or change in vision such as blurry vision, seeing halos around lights, vision loss Unusual vaginal discharge, itching, or odor Side effects that usually do not require medical attention (report to your care team if they continue or are bothersome): Diarrhea Metallic taste in mouth Nausea Stomach pain This list may not describe all possible side effects. Call your doctor for medical advice about side effects. You may report side effects to FDA at 1-800-FDA-1088. Where should I keep my medication? Keep out of the reach of children and pets. Store between 15 and 25 degrees C (59 and 77 degrees F). Protect from light. Get rid of any unused medication after the expiration date. To get rid of medications that are no longer needed or have expired: Take the medication to a medication take-back program. Check with your pharmacy or law enforcement to find a location. If you cannot return the medication, check the label or package insert to see if the medication should be thrown out in the garbage or flushed down the toilet. If you are not sure, ask your care team. If it is safe to put it in the trash, take the medication out of the container. Mix the medication with cat litter, dirt, coffee grounds, or other unwanted substance. Seal the mixture in a bag or container. Put it in the trash. NOTE: This sheet is a summary. It may not cover all possible information. If you have questions about this medicine, talk to your doctor, pharmacist, or health  care provider.  2022 [employer]/Gold Standard (2020-08-04 13:29:17)         '[x]'$    Phenergan (Promethazine 25 mg) Tablets  PROMETHAZINE (proe-METH-a-zeen)  COMMON BRAND NAME(S):  Phenergan, Promacot, Promethazine Hydrochloride.  There may be other brand names for this medicine. Sexual Assault Specific:  This medication has been given to you to assist with nausea or possible sleeplessness.   You have been given three '25mg'$  tablets to take AS NEEDED.  You may take  -1 tablet every 6-8 hours as needed. USES:  This medication is an antihistamine.  It can be used to treat allergic reactions and to treat or prevent nausea and vomiting from illness or motion sickness.  It is also used to make you sleep before surgery, and to help treat pain or nausea after surgery.   HOW TO USE:  Your doctor or healthcare provider will tell you how much of this medicine to use and how often.  Take this medicine by mouth with a glass of water or with food or milk.  Take your doses at regular intervals.  Do not take this medication more often than directed. SIDE EFFECTS:  You should report the following side effects to your doctor or healthcare provider as soon as possible:  blurred vision, irregular heartbeat, palpitations, or chest pain or tightness, muscle or facial twitches, pain or difficulty passing urine, dark-colored urine, pale stools, seizures, skin rash (including itching or hives), slowed or shallow breathing, trouble breathing, unusual bleeding or bruising, yellowing of the eyes or skin, swelling in your face or hands, swelling or tingling in your mouth or throat, fever, sweating, confusion, pain in your upper stomach, problems with balance, walking, or speech, seeing or hearing things that are not really there (especially in children). Side effects that usually do not require medical attention but you should report to your doctor or healthcare provider if they continue or are bothersome include:  headache, nightmares, agitation, nervousness, excitability, not being able to sleep (more likely in children), stuffy  or runny nose, dry mouth, mild skin rash or itching, ringing in your ears.  Tell your doctor or healthcare provider if your symptoms do not start to get better in 1-2 days.  You may get drowsy or dizzy.  Do not drive, use machinery, or do anything that needs mental alertness until you know how this  medication will affect you.  To reduce the risk of dizzy or fainting spells, do not stand or sit up too quickly, especially if you are an older patient.  Alcohol may increase dizziness and drowsiness. Your mouth can get dry.  Chewing sugarless gum or sucking on hard candy and drinking plenty of water may help.  Contact your doctor if the problem does not go away or is severe.  Since this medication can cause dry eyes and blurred vision, if you wear contact lenses you may feel some discomfort.  Lubricating drops may help.  See your eye doctor if the problem does not go away or is severe.  This medication can also make you sensitive to the sun.  Keep out of the sun.  If you cannot avoid being in the sun, wear protective clothing and use sunscreen.  Do not use sunlamps or tanning beds/booths.  If you are diabetic, check your blood sugar levels regularly. This list may not describe all possible side effects.  If you notice other effects not listed above, contact your doctor.  You may report side effects to the Food & Drug Administration (  FDA) at 1-800-FDA-1088. PRECAUTIONS:  Your doctor or healthcare provider needs to know if you have any of the following conditions:  glaucoma, high blood pressure or heart disease, kidney disease, liver disease, lung disease or breathing problems (like asthma), prostate trouble, pain or difficulty passing urine, seizures, an unusual or allergic reaction to promethazine or phenothiazine medicines (e.g. perphenazine, thioridazine, Compazine, Thorazine, and Trilafon), other medicines, foods, dyes, or preservatives, if you are pregnant or trying to get pregnant, or breast-feeding.  Talk to your pediatrician regarding the use of this medicine in children.  Special care may be needed.  This medicine should not be given to infants and children younger than 43 years old.   Make sure your doctor or healthcare professional knows if you are pregnant or breast-feeding.  Tell your doctor if you  have Chronic Obstructive Pulmonary Disease (COPD), asthma, sleep apnea, if you have or have ever had Neuroleptic Malignant Syndrome (NMS),  DRUG INTERACTIONS:  Do not take this medication with any of the following medications:  medicines called MAO Inhibitors (e.g. Nardil, Parnate, Marplan, Eldepryl), or other phenothiazines like trimethobenzamide.  This medication may also interact with the following medications:  barbiturates like phenobarbital, bromocriptine, certain antidepressants, certain antihistamines used in allergy or cold medicines, epinephrine, levodopa, medications for sleep, medications for mental problems & psychotic disturbances (e.g. amitriptyline, doxepin, nortriptyline, phenylzine, selegiline, Elavil, Pamelor, Sinequan), medications for movement abnormalities such as Parkinson's Disease, medications for gastrointestinal problems, muscle relaxants, narcotic pain medications, sedatives.  Do not drink alcohol while using this medicine. This document does not contain all possible interactions.  Therefore, before using this product, tell your doctor or healthcare provider of all the products you use.  Keep a list of all your medications with you, and share the list with your doctor or healthcare provider. NOTES:  Do not share this medication with others.  If you think you have taken too much of this medicine, contact a poison control center or emergency room at once. MISSED DOSE:  If you miss a dose, take it as soon as you can.  If it is almost time for your next dose, take only that dose.  Do not take double or extra doses. STORAGE:  Store at room temperature between 68-77 degrees F (20-25 degrees C), away from light and moisture.  Do not store in the bathroom.  Keep all medicines away from children and pets.  Do not flush medications down the toilet or pour them into the drain unless instructed to do so.  Properly discard this product when it is expired or no longer needed.  Consult your  pharmacist or local waste disposal company for more details about how to safely discard this product.     Results for orders placed or performed during the hospital encounter of 08/21/22  Rapid HIV screen  Result Value Ref Range   HIV-1 P24 Antigen - HIV24 NON REACTIVE NON REACTIVE   HIV 1/2 Antibodies NON REACTIVE NON REACTIVE   Interpretation (HIV Ag Ab)      A non reactive test result means that HIV 1 or HIV 2 antibodies and HIV 1 p24 antigen were not detected in the specimen.  Comprehensive metabolic panel  Result Value Ref Range   Sodium 139 135 - 145 mmol/L   Potassium 3.5 3.5 - 5.1 mmol/L   Chloride 105 98 - 111 mmol/L   CO2 23 22 - 32 mmol/L   Glucose, Bld 119 (H) 70 - 99 mg/dL   BUN 9 6 -  20 mg/dL   Creatinine, Ser 0.95 0.44 - 1.00 mg/dL   Calcium 9.6 8.9 - 10.3 mg/dL   Total Protein 7.4 6.5 - 8.1 g/dL   Albumin 3.6 3.5 - 5.0 g/dL   AST 22 15 - 41 U/L   ALT 13 0 - 44 U/L   Alkaline Phosphatase 46 38 - 126 U/L   Total Bilirubin 0.7 0.3 - 1.2 mg/dL   GFR, Estimated >60 >60 mL/min   Anion gap 11 5 - 15  Hepatitis C antibody  Result Value Ref Range   HCV Ab NON REACTIVE NON REACTIVE  Hepatitis B surface antigen  Result Value Ref Range   Hepatitis B Surface Ag NON REACTIVE NON REACTIVE  POCT Pregnancy, Urine  Result Value Ref Range   Preg Test, Ur

## 2022-08-22 ENCOUNTER — Other Ambulatory Visit (HOSPITAL_COMMUNITY): Payer: Self-pay

## 2022-08-22 LAB — POCT PREGNANCY, URINE

## 2022-08-22 LAB — RPR: RPR Ser Ql: NONREACTIVE

## 2022-08-22 MED ORDER — PROMETHAZINE HCL 25 MG PO TABS
25.0000 mg | ORAL_TABLET | Freq: Four times a day (QID) | ORAL | Status: DC | PRN
  Administered 2022-08-22: 75 mg via ORAL
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
## 2022-10-24 ENCOUNTER — Telehealth: Payer: Self-pay | Admitting: *Deleted

## 2022-10-24 ENCOUNTER — Other Ambulatory Visit: Payer: Self-pay | Admitting: *Deleted

## 2022-10-24 MED ORDER — BREZTRI AEROSPHERE 160-9-4.8 MCG/ACT IN AERO: 2.0000 | INHALATION_SPRAY | Freq: Two times a day (BID) | RESPIRATORY_TRACT | 0 refills | Status: DC

## 2022-10-24 NOTE — Addendum Note (Signed)
Addended by: Delrae Rend on: 10/24/2022 09:44 AM   Modules accepted: Orders

## 2022-10-24 NOTE — Telephone Encounter (Signed)
Patient presented to the office requesting samples of Symbicort, she states she has just moved and has misplaced her inhaler and just had it filled and cannot get another refill.  I reviewed her record and from the last OV with Dr. Sherene Sires, she was put on Breztri and she applied for patient assistance through [employer].  I discussed this with the patient and she said she could not afford the [facility] and she had not heard from the manufacturer, so she went back to the Symbicort.  Advised I would get her samples of Breztri to get her through and provided her with the phone # for [employer] patient assistance to call and check the status of her application.  I asked that she either call or send a mychart message if the manufacturer needed anything further from [place].  She verbalized understanding.

## 2022-12-29 ENCOUNTER — Other Ambulatory Visit: Payer: Self-pay | Admitting: Internal Medicine

## 2023-01-09 ENCOUNTER — Ambulatory Visit: Payer: Medicaid Other | Admitting: Internal Medicine

## 2023-01-09 ENCOUNTER — Encounter: Payer: Self-pay | Admitting: Internal Medicine

## 2023-01-09 VITALS — BP 124/76 | HR 85 | Temp 97.9°F | Ht 68.0 in | Wt 204.8 lb

## 2023-01-09 DIAGNOSIS — J4550 Severe persistent asthma, uncomplicated: Secondary | ICD-10-CM

## 2023-01-09 MED ORDER — BREZTRI AEROSPHERE 160-9-4.8 MCG/ACT IN AERO: INHALATION_SPRAY | RESPIRATORY_TRACT | 11 refills | Status: DC

## 2023-01-09 MED ORDER — BUDESONIDE-FORMOTEROL FUMARATE 160-4.5 MCG/ACT IN AERO: INHALATION_SPRAY | RESPIRATORY_TRACT | 11 refills | Status: DC

## 2023-01-09 MED ORDER — PREDNISONE 10 MG PO TABS: ORAL_TABLET | ORAL | 0 refills | Status: DC

## 2023-01-09 MED ORDER — AZITHROMYCIN 250 MG PO TABS: ORAL_TABLET | ORAL | 0 refills | Status: DC

## 2023-01-09 MED ORDER — PANTOPRAZOLE SODIUM 40 MG PO TBEC: 40.0000 mg | DELAYED_RELEASE_TABLET | Freq: Every day | ORAL | 2 refills | Status: DC

## 2023-01-09 NOTE — Progress Notes (Signed)
@Patient  ID: Crystal Sampson, female    DOB: 1975-10-26 .   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  AP 03/20/2004-total IgE 1977.5 with broad elevation of almost all allergens and extremely high level against dog dander [name] 04/2012:  FEV1 1.72 (58%), ratio 68 No response to singulair.  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  Dyspnea:  no change / still walking dog  Cough: p hs and on off nightly Sleeping: bed is flat/ on side  SABA use: neb once or twice a week in addition to freq hfa  02: none  Covid status:   vax x 2  No ongoing cp's Rec    We will be referring you to allergy   > rec fasenra  02/14/21 but had "falling out" after missed appt       04/09/2022  f/u ov/Crystal Sampson re: asthma   maint on tespire /breztri thru this office  Chief Complaint  Patient presents with   Asthma    Asthma under control per pt.  Seen cardiologist. Tests normal   Dyspnea:  walking dog fine  Cough: none  Sleeping: flat bed  SABA use: not needing  02: none  Rec Plan A = Automatic = Always=   Breztri  Take 2 puffs first thing in am and then another 2 puffs about 12 hours later / tespire injections Work on inhaler technique:  Plan B = Backup (to supplement plan A, not to replace it) Only use your  albuterol inhaler as a rescue medication  Plan C = Crisis (instead of Plan B but only if Plan B stops working) - only use your albuterol nebulizer if you first try Plan B  Last Jarold Motto was ? Oct 2023 - not clear why she stopped    01/09/2023  ACUTE  ov/Crystal Sampson re: Asthma  maint on symbicort  160  s much need for albuterol  Chief Complaint  Patient presents with   Follow-up    Cough x 2 weeks.  Thick mucus production.  Dyspnea:  no change  Cough: new x 2 weeks slt brown now clear p moving into parentes house 3 weeks prior to OV   Sleeping: flat  bed / one bed worse at hs  SABA use: none  02: none      No obvious day to day or daytime variability or assoc  mucus plugs or hemoptysis or cp or chest tightness, subjective wheeze or overt sinus or hb symptoms.    Also denies any obvious fluctuation of symptoms with weather or environmental changes or other aggravating or alleviating factors except as outlined above   No unusual exposure hx or h/o childhood pna/ asthma or knowledge of premature birth.  Current Allergies, Complete Past  Medical History, Past Surgical History, Family History, and Social History were reviewed in [employer] record.  ROS  The following are not active complaints unless bolded Hoarseness, sore throat, dysphagia, dental problems, itching, sneezing,  nasal congestion or discharge of excess mucus or purulent secretions, ear ache,   fever, chills, sweats, unintended wt loss or wt gain, classically pleuritic or exertional cp,  orthopnea pnd or arm/hand swelling  or leg swelling, presyncope, palpitations, abdominal pain, anorexia, nausea, vomiting, diarrhea  or change in bowel habits or change in bladder habits, change in stools or change in urine, dysuria, hematuria,  rash, arthralgias, visual complaints, headache, numbness, weakness or ataxia or problems with walking or coordination,  change in mood or  memory.        Current Meds  Medication Sig    albuterol (PROVENTIL) (2.5 MG/3ML) 0.083% nebulizer solution USE 2 VIALS BY NEBULIZATION EVERY 6 (SIX) HOURS AS NEEDED FOR WHEEZING OR SHORTNESS OF BREATH. DX: J45.40 (Patient taking differently: Take 2.5 mg by nebulization every 4 (four) hours as needed. USE 2 VIALS BY NEBULIZATION EVERY 6 (SIX) HOURS AS NEEDED FOR WHEEZING OR SHORTNESS OF BREATH. DX: J45.40)   albuterol (VENTOLIN HFA) 108 (90 Base) MCG/ACT inhaler INHALE 2 PUFFS EVERY 4 HOURS AS NEEDED ONLY IF YOU CANT CATCH YOUR BREATH   budesonide-formoterol (SYMBICORT) 160-4.5 MCG/ACT inhaler Inhale 2 puffs into the lungs 2 (two) times daily.         Physical Exam  01/09/2023      204   01/20/2021      196   10/21/2020     199 06/12/2019    169 03/11/2019       166  02/09/2019       155  11/05/2018       150  10/22/2018       154  01/13/2018       162  12/16/2017       166   04/17/2017    198   02/14/17 204 lb 12.8 oz (92.9 kg)  07/17/16 208 lb 3.2 oz (94.4 kg)  03/27/16 178 lb (80.7 kg)    Vital signs reviewed  01/09/2023  - Note at rest 02 sats  97% on RA   General appearance:    healthy appearing amb bf nad   HEENT : Oropharynx  clear         NECK :  without  apparent JVD/ palpable Nodes/TM    LUNGS: no acc muscle use,  Nl contour with very minimal insp /exp rhonchi    CV:  RRR  no s3 or murmur or increase in P2, and no edema   ABD:  soft and nontender with nl inspiratory excursion in the supine position. No bruits or organomegaly appreciated   MS:  Nl gait/ ext warm without deformities Or obvious joint restrictions  calf tenderness, cyanosis or clubbing    SKIN: warm and dry without lesions    NEURO:  alert, approp, nl sensorium with  no motor or cerebellar deficits apparent.       Assessment & Plan:

## 2023-01-09 NOTE — Assessment & Plan Note (Addendum)
Onset childhood [name] 2001: FEV1 2.38 (68%), ratio 80. AP 03/20/2004  IgE 1877, RAST very abnormal 2007. Aspergillus CF <1:8 [name] 04/2012:  FEV1 1.72 (58%), ratio 68 No response to singulair.  Allergy Skin Test- 03/25/13- significant positive for grass weed and tree pollens dust mite, cat - changed advair to BREO 12/15/2014 > changed to [facility] 100 due to insurance restrictions 07/08/2015  - 04/17/2017  extensive coaching HFA effectiveness =    75%  From a baseline of 50% - Spirometry 12/16/2017  FEV1 2.14 (74%)  Ratio 79 p am dulera 100 x 2  - FENO 12/16/2017  =   7 p dulera 100 x 2  - 12/16/2017  After extensive coaching inhaler device  effectiveness =    75% and 6 weeks iup > change to symb  80 2bid   - spring 2020 new dog in house  - 10/22/2018   change to symbicort 160  2 bid and rx pred x 6 days  - 03/11/2019  After extensive coaching inhaler device,  effectiveness =  75%    (short ti)  - 10/21/2020  After extensive coaching inhaler device,  effectiveness =  50% (late trigger)  - 10/21/2020 added singulair  -Allergy profile 12/31/20 >  Eos 0.3 /  IgE 2259   - 01/20/2021  After extensive coaching inhaler device,  effectiveness =    60% (trigger was delayed and short ti)  - 01/20/2021 referred to allergy > rec fasenra but "didn't work"  > tespire tried but d/c Oct 2023   Mild acute flare with cough but no rhinitis flare ? Uri vs allergy > rec zpak/ pred x 6 d and f/u if not 100% better  follow up visit in 6 months but call sooner if needed          Each maintenance medication was reviewed in detail including emphasizing most importantly the difference between maintenance and prns and under what circumstances the prns are to be triggered using an action plan format where appropriate.  Total time for H and P, chart review, counseling, reviewing hfa  device(s) and generating customized AVS unique to this ACUTE  office visit / same day charting = 30 min

## 2023-01-09 NOTE — Patient Instructions (Signed)
Zpak  Prednisone 10 mg take  4 each am x 2 days,   2 each am x 2 days,  1 each am x 2 days and stop   No change on symbicort 160 withTake 2 puffs first thing in am and then another 2 puffs about 12 hours later.     Please schedule a follow up visit in 6  months but call sooner if needed

## 2023-03-18 ENCOUNTER — Other Ambulatory Visit: Payer: Self-pay | Admitting: Internal Medicine

## 2023-08-02 ENCOUNTER — Telehealth: Payer: Self-pay | Admitting: Internal Medicine

## 2023-08-02 MED ORDER — PREDNISONE 10 MG PO TABS: ORAL_TABLET | ORAL | 0 refills | Status: DC

## 2023-08-02 MED ORDER — AZITHROMYCIN 250 MG PO TABS: ORAL_TABLET | ORAL | 0 refills | Status: AC

## 2023-08-02 NOTE — Telephone Encounter (Signed)
 I called and spoke with the pt. Pt states she was exposed to covid and flu. Daughter tested neg for both flu and covid. PT has cough, congestion, and headache. The cough is wet, light green mucous. The headache is located in the frontal part, and this is off and on. Denies taken anything otc or rx. She does use her inhaler, more often than normal. Denies fever. Pt states she does have sob, she feels like she does not get enough O2 at times, usually with exertion. Please advise.

## 2023-08-02 NOTE — Addendum Note (Signed)
 Addended by: Katie Parks A on: 08/02/2023 11:10 AM   Modules accepted: Orders

## 2023-08-02 NOTE — Telephone Encounter (Signed)
 Pt is aware of below message/recommendations and voiced her understanding.  Prednisone  and zpak has been sent to preferred pharmacy. Nothing further needed.

## 2023-08-02 NOTE — Telephone Encounter (Signed)
 Patient has been exposed to the flu and possible covid and would like something prescribed for it. She has a head,phlegm and cough. [phone]   [facility] [address]

## 2023-08-02 NOTE — Telephone Encounter (Signed)
 Zpak Prednisone  10 mg take  4 each am x 2 days,   2 each am x 2 days,  1 each am x 2 days and stop  To UC if not better over next 72 h and to ER if worse in meantime

## 2023-08-12 NOTE — Progress Notes (Unsigned)
@Patient  ID: Crystal Sampson, female    DOB: November 29, 1975 .   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  AP 03/20/2004-total IgE 1977.5 with broad elevation of almost all allergens and extremely high level against dog dander [name] 04/2012:  FEV1 1.72 (58%), ratio 68 No response to singulair.  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  Dyspnea:  no change / still walking dog  Cough: p hs and on off nightly Sleeping: bed is flat/ on side  SABA use: neb once or twice a week in addition to freq hfa  02: none  Covid status:   vax x 2  No ongoing cp's Rec    We will be referring you to allergy   > rec fasenra  02/14/21 but had "falling out" after missed appt    04/09/2022  f/u ov/Crystal Sampson re: asthma   maint on tespire /breztri thru this office  Chief Complaint  Patient presents with   Asthma    Asthma under control per pt.  Seen cardiologist. Tests normal   Dyspnea:  walking dog fine  Cough: none  Sleeping: flat bed  SABA use: not needing  02: none  Rec Plan A = Automatic = Always=   Breztri  Take 2 puffs first thing in am and then another 2 puffs about 12 hours later / tespire injections Work on inhaler technique:  Plan B = Backup (to supplement plan A, not to replace it) Only use your albuterol  inhaler as a rescue medication  Plan C = Crisis (instead of Plan B but only if Plan B stops working) - only use your albuterol nebulizer if you first try Plan B  Last Jarold Motto was ? Oct 2023 - not clear why she stopped    01/09/2023  ACUTE  ov/Crystal Sampson re: Asthma  maint on symbicort  160  s much need for albuterol  Chief Complaint  Patient presents with   Follow-up    Cough x 2 weeks.  Thick mucus production.  Dyspnea:  no change  Cough: new x 2 weeks slt brown now clear p moving into parents house 3 weeks prior to OV   Sleeping: flat  bed / one pillow bed worse at hs  SABA use: none  02: none  Rec Zpak Prednisone 10 mg take  4 each am x 2 days,   2 each am x 2 days,  1 each am x 2 days and stop  No change on symbicort 160 withTake 2 puffs first thing in am and then another 2 puffs about 12 hours later.     08/14/2023  f/u ov/Crystal Sampson re: asthma flare x 2 weeks maint on symbicort 160 2bid  and well controlled until URI 2 weeks prior  to OV   Chief Complaint  Patient presents with   Follow-up    Cough with exertion or talking.  Patient would like to get a cxr today  Dyspnea:  minimal  Cough: worse with exert/ light colored / has not seen any provider since onset or tested for viruses Sleeping: on side / sev pillows s resp cc  SABA use: none yet  02: none  Mild R flank pain from coughing / using dayquil     No obvious day to day or daytime variability or assoc  mucus plugs or hemoptysis   or chest tightness, subjective wheeze or overt sinus or hb symptoms.    Also denies any obvious fluctuation of symptoms with weather or environmental changes or other aggravating or alleviating factors except as outlined above   No unusual exposure hx or h/o childhood pna or knowledge of premature birth.  Current Allergies, Complete Past Medical History, Past Surgical History, Family History, and Social History were reviewed in [employer] record.  ROS  The following are not  active complaints unless bolded Hoarseness, sore throat, dysphagia, dental problems, itching, sneezing,  nasal congestion or discharge of excess mucus or purulent secretions, ear ache,   fever, chills, sweats, unintended wt loss or wt gain, classically exertional cp,  orthopnea pnd or arm/hand swelling  or leg swelling, presyncope, palpitations, abdominal pain, anorexia, nausea, vomiting, diarrhea  or change in bowel habits or change in bladder habits, change in stools or change in urine, dysuria, hematuria,  rash, arthralgias, visual complaints, headache, numbness, weakness or ataxia or problems with walking or coordination,  change in mood or  memory.        Current Meds  Medication Sig   albuterol (PROVENTIL) (2.5 MG/3ML) 0.083% nebulizer solution USE 2 VIALS BY NEBULIZATION EVERY 6 (SIX) HOURS AS NEEDED FOR WHEEZING OR SHORTNESS OF BREATH. DX: J45.40 (Patient taking differently: Take 2.5 mg by nebulization every 4 (four) hours as needed. USE 2 VIALS BY NEBULIZATION EVERY 6 (SIX) HOURS AS NEEDED FOR WHEEZING OR SHORTNESS OF BREATH. DX: J45.40)   albuterol (VENTOLIN HFA) 108 (90 Base) MCG/ACT inhaler INHALE 2 PUFFS EVERY 4 HOURS AS NEEDED ONLY IF YOU CANT CATCH YOUR BREATH   budesonide-formoterol (SYMBICORT) 160-4.5 MCG/ACT inhaler Take 2 puffs first thing in am and then another 2 puffs about 12 hours later.           Physical Exam  08/14/2023      218  01/09/2023      204   01/20/2021      196   10/21/2020     199 06/12/2019    169 03/11/2019       166  02/09/2019       155  11/05/2018       150  10/22/2018       154  01/13/2018       162  12/16/2017       166   04/17/2017    198   02/14/17 204 lb 12.8 oz (92.9 kg)  07/17/16 208 lb 3.2 oz (94.4 kg)  03/27/16 178 lb (80.7 kg)    Vital signs reviewed  08/14/2023  - Note at rest 02 sats  98% on RA   General appearance:    amb bm mod obese by BMI    HEENT : Oropharynx  clear     Nasal turbinates mild edema s purulence   NECK :  without   apparent JVD/ palpable Nodes/TM  LUNGS: no acc muscle use,  Nl contour chest with minimal insp /exp rhonchi bilaterally without cough on insp or exp maneuvers   CV:  RRR  no s3 or murmur or increase in P2, and no edema   ABD: obese  soft and nontender   MS:  Gait nl   ext warm without deformities Or obvious joint restrictions  calf tenderness, cyanosis or clubbing    SKIN: warm and dry without lesions    NEURO:  alert, approp, nl sensorium with  no motor or cerebellar deficits apparent.      CXR PA and Lateral:   08/14/2023 :    I personally reviewed images and impression is as follows:      Minimal increase markings RML  no def as dz           Assessment & Plan:

## 2023-08-14 ENCOUNTER — Ambulatory Visit: Payer: Medicaid Other | Admitting: Internal Medicine

## 2023-08-14 ENCOUNTER — Encounter: Payer: Self-pay | Admitting: Internal Medicine

## 2023-08-14 ENCOUNTER — Ambulatory Visit (INDEPENDENT_AMBULATORY_CARE_PROVIDER_SITE_OTHER): Payer: Medicaid Other

## 2023-08-14 VITALS — BP 124/88 | HR 82 | Temp 98.1°F | Ht 68.0 in | Wt 218.2 lb

## 2023-08-14 DIAGNOSIS — J4550 Severe persistent asthma, uncomplicated: Secondary | ICD-10-CM

## 2023-08-14 MED ORDER — AZITHROMYCIN 250 MG PO TABS: ORAL_TABLET | ORAL | 0 refills | Status: DC

## 2023-08-14 MED ORDER — PREDNISONE 10 MG PO TABS: ORAL_TABLET | ORAL | 0 refills | Status: DC

## 2023-08-14 NOTE — Patient Instructions (Addendum)
Work on inhaler technique:  relax and gently blow all the way out then take a nice smooth full deep breath back in, triggering the inhaler at same time you start breathing in.  Hold breath in for at least  5 seconds if you can. Blow out symbicort  thru nose. Rinse and gargle with water when done.  If mouth or throat bother you at all,  try brushing teeth/gums/tongue with arm and hammer toothpaste/ make a slurry and gargle and spit out.   Prednisone 10 mg take  4 each am x 2 days,   2 each am x 2 days,  1 each am x 2 days and stop   Zpak   For cough mucinex dm 1200mg  every 12 hours as needed   Try prilosec otc 20 mg  Take 30-60 min before first meal of the day and Pepcid ac (famotidine) 20 mg one @  bedtime until cough is completely gone for at least a week without the need for cough suppression     Please remember to go to the  x-ray department  for your tests - we will call you with the results when they are available     Please schedule a follow up visit in 6  months but call sooner if needed

## 2023-08-14 NOTE — Assessment & Plan Note (Signed)
Onset childhood Crystal Sampson 2001: FEV1 2.38 (68%), ratio 80. AP 03/20/2004  IgE 1877, RAST very abnormal 2007. Aspergillus CF <1:8 Crystal Sampson 04/2012:  FEV1 1.72 (58%), ratio 68 No response to singulair.  Allergy Skin Test- 03/25/13- significant positive for grass weed and tree pollens dust mite, cat - changed advair to BREO 12/15/2014 > changed to [facility] 100 due to insurance restrictions 07/08/2015  - 04/17/2017  extensive coaching HFA effectiveness =    75%  From a baseline of 50% - Spirometry 12/16/2017  FEV1 2.14 (74%)  Ratio 79 p am dulera 100 x 2  - FENO 12/16/2017  =   7 p dulera 100 x 2  - 12/16/2017  After extensive coaching inhaler device  effectiveness =    75% and 6 weeks iup > change to symb  80 2bid   - spring 2020 new dog in house  - 10/22/2018   change to symbicort 160  2 bid and rx pred x 6 days  - 03/11/2019  After extensive coaching inhaler device,  effectiveness =  75%    (short ti)  - 10/21/2020  After extensive coaching inhaler device,  effectiveness =  50% (late trigger)  - 10/21/2020 added singulair  -Allergy profile 12/31/20 >  Eos 0.3 /  IgE 2259   - 01/20/2021  After extensive coaching inhaler device,  effectiveness =    60% (trigger was delayed and short ti)  - 01/20/2021 referred to allergy > rec fasenra but "didn't work"  > tespire tried but d/c Oct 2023   Mild flare with URI rec   Work on inhaler technique/ no need to change maint rx = symbicort 160 2bid   Prednisone 10 mg take  4 each am x 2 days,   2 each am x 2 days,  1 each am x 2 days and stop   Zpak   For cough mucinex dm 1200mg  every 12 hours as needed   Try prilosec otc 20 mg  Take 30-60 min before first meal of the day and Pepcid ac (famotidine) 20 mg one @  bedtime until cough is completely gone for at least a week without the need for cough suppression  F/u prn           Each maintenance medication was reviewed in detail including emphasizing most importantly the difference between maintenance and prns and under  what circumstances the prns are to be triggered using an action plan format where appropriate.  Total time for H and P, chart review, counseling, reviewing hfa  device(s) and generating customized AVS unique to this office visit / same day charting = 

## 2023-08-26 ENCOUNTER — Other Ambulatory Visit: Payer: Self-pay | Admitting: Internal Medicine

## 2023-09-20 ENCOUNTER — Other Ambulatory Visit: Payer: Self-pay | Admitting: Internal Medicine

## 2023-09-20 NOTE — Telephone Encounter (Signed)
 Copied from CRM [id number]. Topic: Clinical - Medication Refill >> Sep 20, 2023 11:58 AM Shelbie Proctor wrote: Most Recent Pulmonary Care Visit: 08/14/23 with Dr. Sherene Sires in [place]   Medication: albuterol (VENTOLIN HFA) 108 (90 Base) MCG/ACT inhaler and albuterol (PROVENTIL) (2.5 MG/3ML) 0.083% nebulizer solution expired in January 2025   Has the patient contacted their pharmacy? Yes (Agent: If no, request that the patient contact the pharmacy for the refill. If patient does not wish to contact the pharmacy document the reason why and proceed with request.) (Agent: If yes, when and what did the pharmacy advise?)  Is this the correct pharmacy for this prescription? Yes If no, delete pharmacy and type the correct one.  This is the patient's preferred pharmacy:  [id number] [place], [place] - [address] AT [facility] [address] [place] [address] Phone: [phone] Fax: [phone]   Has the prescription been filled recently? No  Is the patient out of the medication? Yes  Has the patient been seen for an appointment in the last year OR does the patient have an upcoming appointment? Yes  Can we respond through MyChart? No, please call [phone] or [phone]  Agent: Please be advised that Rx refills may take up to 3 business days. We ask that you follow-up with your pharmacy.

## 2023-10-15 ENCOUNTER — Telehealth: Payer: Self-pay | Admitting: Internal Medicine

## 2023-10-15 MED ORDER — ALBUTEROL SULFATE (2.5 MG/3ML) 0.083% IN NEBU: INHALATION_SOLUTION | RESPIRATORY_TRACT | 11 refills | Status: AC

## 2023-10-15 MED ORDER — ALBUTEROL SULFATE HFA 108 (90 BASE) MCG/ACT IN AERS: INHALATION_SPRAY | RESPIRATORY_TRACT | 11 refills | Status: DC

## 2023-10-15 MED ORDER — BUDESONIDE-FORMOTEROL FUMARATE 160-4.5 MCG/ACT IN AERO: INHALATION_SPRAY | RESPIRATORY_TRACT | 11 refills | Status: DC

## 2023-10-15 NOTE — Telephone Encounter (Signed)
 I called and spoke with the pt and verified the msg  I have sent refills for albuterol  inhaler, neb sol and symbicort   Nothing further needed

## 2023-10-15 NOTE — Telephone Encounter (Signed)
 PT states she has been in twice (once today) and has also  called for the following refill needed:  Albiuterol Solution (It expired, the one she has now) Ventolin  Inhaler Symbicort   [facility] [address]  I see no notes about her coming in or calling for these refill. I told her we would send them back for refill as [facility] Priority.

## 2023-11-25 ENCOUNTER — Telehealth: Payer: Self-pay | Admitting: Internal Medicine

## 2023-11-25 ENCOUNTER — Ambulatory Visit: Payer: Self-pay | Admitting: Internal Medicine

## 2023-11-25 NOTE — Telephone Encounter (Signed)
 Called and spoke with pt. Pt states she is using neb treatment and inhalers more than she should and is having increased SOB. I advised pt if SOB continues to worsen to go to UC and pt denied stating she will wait for ov. Pt is aware of scheduled appt. NFN  FYI Dr. Dione Franks pt scheduled acute ov 6/4. Please see previous triage encounter.

## 2023-11-25 NOTE — Telephone Encounter (Signed)
 Patient walked in office presenting a telephone encounter w/ Dr. Waymond Hailey stating to scheduled an appointment with in 3-4 hours due to shortness of breath with HPC and/or PCP. (E2C2 Triage Phone encounter completed today at 8:32am as well). Patient stated she did not have a PCP and was looking to schedule w/ us . Due to scheduling restrictions we were unable to complete a Same Day visit with our schedules as of today.   She did have a preference to not see the NP that she had seen previously due to all the medication changes. We did state that it is recommended as well to see UC due to the timeframe that was recommended to see a HCP. She also added that she preferred to not go to the hospital.   She did state that she was okay with us  giving her a call back regarding this acute visit scheduling when we asked her to have a seat. She has since left awaiting our f/u call.   Is there possibly a provider that may have availability to see this patient within this timeframe or 24 hours that we could work in? Please advise and we will coordinate scheduling as needed.

## 2023-11-25 NOTE — Telephone Encounter (Signed)
 E2C2 Pulmonary Triage - Initial Assessment Questions "Chief Complaint (e.g., cough, sob, wheezing, fever, chills, sweat or additional symptoms) *Go to specific symptom protocol after initial questions. Patient with history of allergic asthma reports new onset of shortness of breath that started a month ago. Patient states this shortness of breath is different-reports multiple uses of albuterol  inhaler and nebulizer. Patient denies cough, wheezing, fever, chills or sweats. Patient is speaking in full sentences with Nurse Triage without any distress. Patient reports not monitoring her oxygen levels at home. Patient is needing a phone call back to schedule an appointment.   "How long have symptoms been present?" Started a month ago  Have you tested for COVID or Flu? Note: If not, ask patient if a home test can be taken. If so, instruct patient to call back for positive results. No  MEDICINES:   "Have you used any OTC meds to help with symptoms?" No If yes, ask "What medications?"   "Have you used your inhalers/maintenance medication?" Yes If yes, "What medications?" Albuterol  inhaler/nebulizer  If inhaler, ask "How many puffs and how often?" Note: Review instructions on medication in the chart. Albuterol  inhaler-2 puffs-patient reports having been using her inhaler more often.   OXYGEN: "Do you wear supplemental oxygen?" No If yes, "How many liters are you supposed to use?"   "Do you monitor your oxygen levels?" No If yes, "What is your reading (oxygen level) today?"    Copied from CRM #[id number]. Topic: Clinical - Red Word Triage >> Nov 25, 2023  8:14 AM Crist Dominion wrote: Red Word that prompted transfer to Nurse Triage: New shortness of breath. Reason for Disposition  [1] Longstanding difficulty breathing (e.g., CHF, COPD, emphysema) AND [2] WORSE than normal  Answer Assessment - Initial Assessment Questions 1. RESPIRATORY STATUS: "Describe your breathing?" (e.g., wheezing,  shortness of breath, unable to speak, severe coughing)      Shortness of breath 2. ONSET: "When did this breathing problem begin?"      Started a month ago 3. PATTERN "Does the difficult breathing come and go, or has it been constant since it started?"      intermittent 4. SEVERITY: "How bad is your breathing?" (e.g., mild, moderate, severe)    - MILD: No SOB at rest, mild SOB with walking, speaks normally in sentences, can lie down, no retractions, pulse < 100.    - MODERATE: SOB at rest, SOB with minimal exertion and prefers to sit, cannot lie down flat, speaks in phrases, mild retractions, audible wheezing, pulse 100-120.    - SEVERE: Very SOB at rest, speaks in single words, struggling to breathe, sitting hunched forward, retractions, pulse > 120      Moderate 5. RECURRENT SYMPTOM: "Have you had difficulty breathing before?" If Yes, ask: "When was the last time?" and "What happened that time?"      Yes 6. CARDIAC HISTORY: "Do you have any history of heart disease?" (e.g., heart attack, angina, bypass surgery, angioplasty)      no 7. LUNG HISTORY: "Do you have any history of lung disease?"  (e.g., pulmonary embolus, asthma, emphysema)     Asthma 8. CAUSE: "What do you think is causing the breathing problem?"      Unsure of what could be causing it 9. OTHER SYMPTOMS: "Do you have any other symptoms? (e.g., dizziness, runny nose, cough, chest pain, fever)     No 10. O2 SATURATION MONITOR:  "Do you use an oxygen saturation monitor (pulse oximeter) at home?" If Yes, ask: "  What is your reading (oxygen level) today?" "What is your usual oxygen saturation reading?" (e.g., 95%)       Patient doesn't monitor her oxygen levels 12. TRAVEL: "Have you traveled out of the country in the last month?" (e.g., travel history, exposures)       no  Protocols used: Breathing Difficulty-A-AH

## 2023-11-25 NOTE — Telephone Encounter (Signed)
Pt has been scheduled. Please see previous encounter.

## 2023-11-27 ENCOUNTER — Ambulatory Visit (INDEPENDENT_AMBULATORY_CARE_PROVIDER_SITE_OTHER): Admitting: Internal Medicine

## 2023-11-27 ENCOUNTER — Encounter: Payer: Self-pay | Admitting: Internal Medicine

## 2023-11-27 VITALS — BP 116/81 | HR 78 | Ht 68.0 in | Wt 216.6 lb

## 2023-11-27 DIAGNOSIS — B370 Candidal stomatitis: Secondary | ICD-10-CM | POA: Diagnosis not present

## 2023-11-27 DIAGNOSIS — J45901 Unspecified asthma with (acute) exacerbation: Secondary | ICD-10-CM | POA: Diagnosis not present

## 2023-11-27 DIAGNOSIS — K219 Gastro-esophageal reflux disease without esophagitis: Secondary | ICD-10-CM | POA: Diagnosis not present

## 2023-11-27 DIAGNOSIS — R768 Other specified abnormal immunological findings in serum: Secondary | ICD-10-CM

## 2023-11-27 DIAGNOSIS — J4541 Moderate persistent asthma with (acute) exacerbation: Secondary | ICD-10-CM

## 2023-11-27 MED ORDER — PREDNISONE 20 MG PO TABS: 40.0000 mg | ORAL_TABLET | Freq: Every day | ORAL | 0 refills | Status: AC

## 2023-11-27 MED ORDER — PANTOPRAZOLE SODIUM 40 MG PO TBEC: 40.0000 mg | DELAYED_RELEASE_TABLET | Freq: Every day | ORAL | 3 refills | Status: AC
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
## 2023-11-27 NOTE — Patient Instructions (Addendum)
 It was a pleasure to see you today!  Please schedule follow up with Dr Waymond Hailey in 3 months.  If my schedule is not open yet, we will contact you with a reminder closer to that time. Please call [phone] if you haven't heard from us  a month before, and always call us  sooner if issues or concerns arise. You can also send us  a message through MyChart, but but aware that this is not to be used for urgent issues and it may take up to 5-7 days to receive a reply. Please be aware that you will likely be able to view your results before I have a chance to respond to them. Please give us  5 business days to respond to any non-urgent results.     Sorry to hear breathing has not been feeling well.  I did use a prednisone  40 mg for the next 5 days to help calm down your asthma.  I think your reflux is likely triggering your asthma especially at nighttime.  I would like you to go back on an acid reflux medication with pantoprazole  once daily.  We talked about increasing your steroid inhaler from Symbicort  to another inhaler such as Advair  or Dulera.  I know you are concerned about cost.  We can hold off and keep the Symbicort  the same and try treating your reflux first.  Make sure you gargle after you use the Symbicort  inhaler and that will help prevent thrush.  The back of your throat looks a little bit irritated which is consistent with thrush.  You can gargle with warm water and if that not effective you can try an alcohol-based mouthwash.  Continue the albuterol  inhaler and nebulizer treatments up to 4 times a day as needed.  It is important for you to try to distinguish when you are having a panic attack versus when you are having an asthma attack.  With the panic attacks you would not expect the albuterol  to improve your symptoms.

## 2024-01-21 ENCOUNTER — Other Ambulatory Visit: Payer: Self-pay | Admitting: Internal Medicine

## 2024-01-22 ENCOUNTER — Telehealth: Payer: Self-pay | Admitting: Internal Medicine

## 2024-01-22 NOTE — Telephone Encounter (Signed)
 Completed about 40 minutes ago. Nothing further needed.

## 2024-01-22 NOTE — Telephone Encounter (Signed)
 Patient is in person at the office. She is requesting a refill of her Symbicort  because she may be losing her insurance tomorrow. A request has been sent over by her pharmacy.   [facility]acy: [facility] [address]

## 2024-04-30 ENCOUNTER — Other Ambulatory Visit: Payer: Self-pay | Admitting: Internal Medicine

## 2024-04-30 MED ORDER — ALBUTEROL SULFATE HFA 108 (90 BASE) MCG/ACT IN AERS: INHALATION_SPRAY | RESPIRATORY_TRACT | 6 refills | Status: AC

## 2024-04-30 NOTE — Telephone Encounter (Signed)
 Copied from CRM [id number]. Topic: Clinical - Medication Refill >> Apr 30, 2024 11:49 AM Isabell A wrote: Medication: albuterol  (VENTOLIN  HFA) 108 (90 Base) MCG/ACT inhaler [[id number]]  Has the patient contacted their pharmacy? Yes (Agent: If no, request that the patient contact the pharmacy for the refill. If patient does not wish to contact the pharmacy document the reason why and proceed with request.) (Agent: If yes, when and what did the pharmacy advise?)  This is the patient's preferred pharmacy:  [id number] - [place], [place] - [address] AT [facility] [address] [place] [address] Phone: [phone] Fax: [phone]  Is this the correct pharmacy for this prescription? Yes If no, delete pharmacy and type the correct one.   Has the prescription been filled recently? Yes  Is the patient out of the medication? Yes  Has the patient been seen for an appointment in the last year OR does the patient have an upcoming appointment? Yes  Can we respond through MyChart? No  Agent: Please be advised that Rx refills may take up to 3 business days. We ask that you follow-up with your pharmacy.

## 2024-05-08 ENCOUNTER — Other Ambulatory Visit: Payer: Self-pay | Admitting: Internal Medicine
